# Patient Record
Sex: Female | Born: 1935
Health system: Southern US, Community
[De-identification: ages and names within clinical notes are randomized; demographics above are authoritative.]

## PROBLEM LIST (undated history)

## (undated) DIAGNOSIS — I350 Nonrheumatic aortic (valve) stenosis: Secondary | ICD-10-CM

## (undated) DIAGNOSIS — R0602 Shortness of breath: Secondary | ICD-10-CM

## (undated) DIAGNOSIS — I8390 Asymptomatic varicose veins of unspecified lower extremity: Secondary | ICD-10-CM

## (undated) DIAGNOSIS — R42 Dizziness and giddiness: Secondary | ICD-10-CM

## (undated) DIAGNOSIS — H269 Unspecified cataract: Secondary | ICD-10-CM

## (undated) DIAGNOSIS — F419 Anxiety disorder, unspecified: Secondary | ICD-10-CM

## (undated) DIAGNOSIS — M545 Low back pain, unspecified: Secondary | ICD-10-CM

## (undated) DIAGNOSIS — K219 Gastro-esophageal reflux disease without esophagitis: Secondary | ICD-10-CM

## (undated) DIAGNOSIS — I4891 Unspecified atrial fibrillation: Secondary | ICD-10-CM

## (undated) DIAGNOSIS — N390 Urinary tract infection, site not specified: Secondary | ICD-10-CM

## (undated) DIAGNOSIS — M1990 Unspecified osteoarthritis, unspecified site: Secondary | ICD-10-CM

## (undated) DIAGNOSIS — I10 Essential (primary) hypertension: Secondary | ICD-10-CM

## (undated) DIAGNOSIS — I739 Peripheral vascular disease, unspecified: Secondary | ICD-10-CM

## (undated) DIAGNOSIS — D649 Anemia, unspecified: Secondary | ICD-10-CM

## (undated) DIAGNOSIS — M869 Osteomyelitis, unspecified: Secondary | ICD-10-CM

## (undated) DIAGNOSIS — J302 Other seasonal allergic rhinitis: Secondary | ICD-10-CM

## (undated) HISTORY — PX: SPINE SURGERY: SHX786

## (undated) HISTORY — DX: Essential (primary) hypertension: I10

## (undated) HISTORY — PX: OTHER SURGICAL HISTORY: SHX169

## (undated) HISTORY — DX: Other seasonal allergic rhinitis: J30.2

## (undated) HISTORY — DX: Unspecified atrial fibrillation: I48.91

## (undated) HISTORY — DX: Osteomyelitis, unspecified: M86.9

## (undated) HISTORY — DX: Peripheral vascular disease, unspecified: I73.9

## (undated) HISTORY — DX: Asymptomatic varicose veins of unspecified lower extremity: I83.90

## (undated) HISTORY — PX: CARDIAC CATHETERIZATION: SHX172

## (undated) HISTORY — DX: Nonrheumatic aortic (valve) stenosis: I35.0

## (undated) HISTORY — DX: Dizziness and giddiness: R42

## (undated) HISTORY — PX: AORTIC VALVE REPLACEMENT: SHX41

---

## 2010-05-21 ENCOUNTER — Emergency Department (HOSPITAL_COMMUNITY)
Admission: EM | Admit: 2010-05-21 | Discharge: 2010-05-21 | Disposition: A | Discharge status: Home / Self Care | Payer: Medicare Other | Attending: Emergency Medicine | Admitting: Emergency Medicine

## 2010-05-21 ENCOUNTER — Emergency Department (HOSPITAL_COMMUNITY): Payer: Medicare Other

## 2010-05-21 DIAGNOSIS — Y998 Other external cause status: Secondary | ICD-10-CM | POA: Insufficient documentation

## 2010-05-21 DIAGNOSIS — Y9229 Other specified public building as the place of occurrence of the external cause: Secondary | ICD-10-CM | POA: Insufficient documentation

## 2010-05-21 DIAGNOSIS — H9190 Unspecified hearing loss, unspecified ear: Secondary | ICD-10-CM | POA: Insufficient documentation

## 2010-05-21 DIAGNOSIS — IMO0002 Reserved for concepts with insufficient information to code with codable children: Secondary | ICD-10-CM | POA: Insufficient documentation

## 2010-05-21 DIAGNOSIS — S0990XA Unspecified injury of head, initial encounter: Secondary | ICD-10-CM | POA: Insufficient documentation

## 2010-05-21 DIAGNOSIS — M25519 Pain in unspecified shoulder: Secondary | ICD-10-CM | POA: Insufficient documentation

## 2010-05-21 DIAGNOSIS — Z79899 Other long term (current) drug therapy: Secondary | ICD-10-CM | POA: Insufficient documentation

## 2010-05-21 DIAGNOSIS — W108XXA Fall (on) (from) other stairs and steps, initial encounter: Secondary | ICD-10-CM | POA: Insufficient documentation

## 2010-10-23 ENCOUNTER — Encounter (HOSPITAL_COMMUNITY): Payer: Medicare Other

## 2010-10-28 ENCOUNTER — Encounter (HOSPITAL_COMMUNITY): Admission: RE | Payer: Self-pay | Source: Ambulatory Visit

## 2010-10-28 ENCOUNTER — Ambulatory Visit (HOSPITAL_COMMUNITY): Admission: RE | Admit: 2010-10-28 | Payer: Medicare Other | Source: Ambulatory Visit | Admitting: Ophthalmology

## 2010-10-28 SURGERY — PHACOEMULSIFICATION, CATARACT, WITH IOL INSERTION
Anesthesia: Monitor Anesthesia Care | Site: Eye | Laterality: Right

## 2010-10-29 ENCOUNTER — Encounter: Payer: Self-pay | Admitting: *Deleted

## 2010-10-31 ENCOUNTER — Ambulatory Visit (INDEPENDENT_AMBULATORY_CARE_PROVIDER_SITE_OTHER): Payer: Medicare Other | Admitting: Cardiovascular Disease

## 2010-10-31 ENCOUNTER — Encounter: Payer: Self-pay | Admitting: *Deleted

## 2010-10-31 ENCOUNTER — Encounter: Payer: Self-pay | Admitting: Cardiovascular Disease

## 2010-10-31 VITALS — BP 159/89 | HR 81 | Ht 66.5 in | Wt 150.0 lb

## 2010-10-31 DIAGNOSIS — R42 Dizziness and giddiness: Secondary | ICD-10-CM

## 2010-10-31 DIAGNOSIS — Z136 Encounter for screening for cardiovascular disorders: Secondary | ICD-10-CM

## 2010-10-31 DIAGNOSIS — R0602 Shortness of breath: Secondary | ICD-10-CM

## 2010-10-31 DIAGNOSIS — I359 Nonrheumatic aortic valve disorder, unspecified: Secondary | ICD-10-CM

## 2010-10-31 DIAGNOSIS — I10 Essential (primary) hypertension: Secondary | ICD-10-CM | POA: Insufficient documentation

## 2010-10-31 DIAGNOSIS — I350 Nonrheumatic aortic (valve) stenosis: Secondary | ICD-10-CM | POA: Insufficient documentation

## 2010-10-31 LAB — PROTIME-INR

## 2010-10-31 NOTE — Progress Notes (Signed)
HPI  This is an 77 year old female who is referred by Dr. Sherril Croon for evaluation of recently diagnosed severe aortic stenosis. The patient was not aware of any previous cardiac history. Overall she is relatively healthy with no chronic medical conditions except hypertension. She has no previous history of his ischemic heart disease, diabetes or stroke. She did have a fall in May of this year but she denies syncope. She does admit that she's been feeling dizzy recently especially upon standing up. She denies any chest pain. However, she's been having progressive exertional dyspnea. Although she says that she feels fine, it is clear that she gradually cut down in her activities throughout the years. Currently, the maximum that she walks is going to the mailbox. She appears to be short of breath just talking with her and also moving from the chair to the exam table. She denies any orthopnea or PND. She has no previous history of peripheral arterial disease. She had an echocardiogram done which showed normal LV systolic function with an estimated ejection fraction of 60-65% with left ventricular hypertrophy and diastolic dysfunction. The aortic valve was noted to be calcified with severe aortic stenosis. Mean gradient was 43 and maximum gradient was 81 mmHg with a calculated valve area of 0.57 cm. There was mild tricuspid regurgitation with an estimated pulmonary artery systolic pressure between 40 and 50.  Allergies  Allergen Reactions  . Penicillins Swelling  . Sulfa Antibiotics Other (See Comments)    unknown     Current Outpatient Prescriptions on File Prior to Visit  Medication Sig Dispense Refill  . b complex vitamins tablet Take 1 tablet by mouth daily.        . cholecalciferol (VITAMIN D) 400 UNITS TABS Take 400 Units by mouth daily.        . Garlic 1000 MG CAPS Take 1 capsule by mouth daily.        . Multiple Vitamin (MULTIVITAMIN) tablet Take 1 tablet by mouth daily.        Marland Kitchen POTASSIUM  GLUCONATE PO Take 1 tablet by mouth daily.           Past Medical History  Diagnosis Date  . Varicose veins   . Seasonal allergies   . Osteomyelitis     history of osteomyelistis in the leg  . Aortic stenosis   . Dizziness   . Hypertension      History reviewed. No pertinent past surgical history.   History reviewed. No pertinent family history.   History   Social History  . Marital Status: Single    Spouse Name: N/A    Number of Children: N/A  . Years of Education: N/A   Occupational History  . Not on file.   Social History Main Topics  . Smoking status: Never Smoker   . Smokeless tobacco: Never Used  . Alcohol Use: No  . Drug Use: No  . Sexually Active: Not on file   Other Topics Concern  . Not on file   Social History Narrative  . No narrative on file     ROS Constitutional: Negative for fever, chills, diaphoresis, activity change, appetite change and fatigue.  HENT: Negative for hearing loss, nosebleeds, congestion, sore throat, facial swelling, drooling, trouble swallowing, neck pain, voice change, sinus pressure and tinnitus.  Eyes: Negative for photophobia, pain, discharge and visual disturbance.  Respiratory: Negative for apnea, cough, chest tightness and wheezing.  Cardiovascular: Negative for chest pain, palpitations and leg swelling.  Gastrointestinal: Negative for nausea,  vomiting, abdominal pain, diarrhea, constipation, blood in stool and abdominal distention.  Genitourinary: Negative for dysuria, urgency, frequency, hematuria and decreased urine volume.  Musculoskeletal: Negative for myalgias, back pain, joint swelling, arthralgias and gait problem.  Skin: Negative for color change, pallor, rash and wound.  Neurological: Negative for  tremors, seizures, syncope, speech difficulty, weakness, light-headedness, numbness and headaches.  Psychiatric/Behavioral: Negative for suicidal ideas, hallucinations, behavioral problems and agitation. The  patient is not nervous/anxious.      PHYSICAL EXAM   BP 159/89  Pulse 81  Ht 5' 6.5" (1.689 m)  Wt 150 lb (68.04 kg)  BMI 23.85 kg/m2  Constitutional: She is oriented to person, place, and time. She appears well-developed and well-nourished. No distress.  HENT: No nasal discharge.  Head: Normocephalic and atraumatic.  Eyes: Pupils are equal, round, and reactive to light. Right eye exhibits no discharge. Left eye exhibits no discharge.  Neck: Normal range of motion. Neck supple. No JVD present. No thyromegaly present. Faint bruits heard bilaterally but could be from irradiated carotid murmur Cardiovascular: Normal rate, regular rhythm, diminished S2 and intact distal pulses. Exam reveals no gallop and no friction rub.  There is a 3/6 systolic ejection murmur heard best at the aortic area with very diminished S2 and overall late peaking..  Pulmonary/Chest: Effort normal and breath sounds normal. No stridor. No respiratory distress. She has no wheezes. She has no rales. She exhibits no tenderness.  Abdominal: Soft. Bowel sounds are normal. She exhibits no distension. There is no tenderness. There is no rebound and no guarding.  Musculoskeletal: Normal range of motion. She exhibits no edema and no tenderness.  Neurological: She is alert and oriented to person, place, and time. Coordination normal.  Skin: Skin is warm and dry. No rash noted. She is not diaphoretic. No erythema. No pallor.  Psychiatric: She has a normal mood and affect. Her behavior is normal. Judgment and thought content normal.    EKG: Normal sinus rhythm with diffuse ST depression in the inferior leads as well as anterolateral leads of at least 1 mm suggestive of ischemia.   ASSESSMENT AND PLAN

## 2010-10-31 NOTE — Assessment & Plan Note (Signed)
Due to severe aortic stenosis, I would not treat this at this time.

## 2010-10-31 NOTE — Assessment & Plan Note (Signed)
The patient has severe aortic stenosis as outlined above. I think she is very symptomatic but she has cut down her activities significantly to the point where she doesn't report the symptoms. She is clearly dyspneic and has been having increased dizziness although without frank syncope or heart failure. She reports no chest pain. I think aortic valve replacement is indicated. I had a prolonged discussion with her about different management options that include traditional aortic valve replacement versus transcatheter aortic valve replacement which is less invasive. I will request an echocardiogram first to be reviewed. We'll schedule the patient for cardiac catheterization to rule out underlying coronary artery disease as well. I still think that the patient's surgical risk with aortic valve replacement might not be prohibitive given that she has no other chronic medical conditions.

## 2010-10-31 NOTE — Patient Instructions (Addendum)
Your physician recommends that you go to the [facility] for lab work/chest x-ray. DO TODAY. Your physician has requested that you have a carotid duplex. This test is an ultrasound of the carotid arteries in your neck. It looks at blood flow through these arteries that supply the brain with blood. Allow one hour for this exam. There are no restrictions or special instructions. Your physician has requested that you have a cardiac catheterization. Cardiac catheterization is used to diagnose and/or treat various heart conditions. Doctors may recommend this procedure for a number of different reasons. The most common reason is to evaluate chest pain. Chest pain can be a symptom of coronary artery disease (CAD), and cardiac catheterization can show whether plaque is narrowing or blocking your heart's arteries. This procedure is also used to evaluate the valves, as well as measure the blood flow and oxygen levels in different parts of your heart. For further information please visit [email or web address] Please follow instruction sheet, as given.

## 2010-10-31 NOTE — Assessment & Plan Note (Signed)
This is likely due to aortic stenosis but we'll also have to rule out carotid artery disease. Thus, I will request bilateral carotid ultrasound Doppler.

## 2010-11-01 ENCOUNTER — Telehealth: Payer: Self-pay | Admitting: *Deleted

## 2010-11-01 NOTE — Telephone Encounter (Signed)
No precert required 

## 2010-11-01 NOTE — Telephone Encounter (Signed)
Precert for cath:  (L) Heart Cath  JV Lab Dr. Kirke Corin 11/06/10 Arrive 10:30 for 11:30 case

## 2010-11-06 ENCOUNTER — Inpatient Hospital Stay (HOSPITAL_BASED_OUTPATIENT_CLINIC_OR_DEPARTMENT_OTHER)
Admission: RE | Admit: 2010-11-06 | Discharge: 2010-11-06 | Disposition: A | Discharge status: Home / Self Care | Payer: Medicare Other | Source: Ambulatory Visit | Attending: Cardiovascular Disease | Admitting: Cardiovascular Disease

## 2010-11-06 DIAGNOSIS — I359 Nonrheumatic aortic valve disorder, unspecified: Secondary | ICD-10-CM

## 2010-11-06 LAB — POCT I-STAT 3, ART BLOOD GAS (G3+)
pCO2 arterial: 31 mmHg — ABNORMAL LOW (ref 35.0–45.0)
pH, Arterial: 7.471 — ABNORMAL HIGH (ref 7.350–7.400)

## 2010-11-06 LAB — POCT I-STAT 3, VENOUS BLOOD GAS (G3P V): Acid-base deficit: 1 mmol/L (ref 0.0–2.0)

## 2010-11-08 NOTE — Cardiovascular Report (Signed)
NAMEJENISSA, Lauren Morrow             ACCOUNT NO.:  [id number]  MEDICAL RECORD NO.:  [id number]  LOCATION:  [facility]                         FACILITY:  [facility]  PHYSICIAN:  Lorine Bears, MD     DATE OF BIRTH:  1924-10-31  DATE OF PROCEDURE:  11/06/2010 DATE OF DISCHARGE:                           CARDIAC CATHETERIZATION   PRIMARY CARE PHYSICIAN:  Doreen Beam, MD  PROCEDURES PERFORMED: 1. Right heart catheterization. 2. Coronary angiography.  INDICATION AND CLINICAL HISTORY:  This is an 77 year old female who has been relatively healthy throughout her life.  She has history of hypertension, but no other chronic medical conditions.  She recently had increased dyspnea and dizziness without syncope.  She was evaluated by an echocardiogram, which was reviewed by me.  It showed normal LV systolic function with an estimated ejection fraction of 60-65% with mild left ventricular hypertrophy and mild diastolic dysfunction.  The aortic valve was noted to be heavily calcified with evidence of severe aortic stenosis.  Mean gradient was 43 mmHg and maximum gradient was 81 mmHg.  Calculated valve area was around 0.6 cm2.  No other valvular abnormalities were noted.  Due to her symptoms and aortic stenosis, I recommended proceeding with coronary angiography and right heart catheterization for evaluation for aortic valve replacement.  Risks, benefits, and alternatives were discussed with the patient.  STUDY DETAILS:  A standard informed consent was obtained.  The right groin area was prepped in a sterile fashion.  This was anesthetized with 1% lidocaine.  A 7-French sheath was placed in the right femoral vein. A 4-French sheath was placed in the right femoral artery.  Right heart catheterization was done with a Swan-Ganz catheter.  Cardiac output was calculated by the Fick method.  Coronary angiography was performed with a JL-4 and 3-DRC catheters.  All catheter exchanges were done over the wire.   The patient tolerated the procedure well with no immediate complications.  STUDY FINDINGS:  Hemodynamic Findings:  Right atrial pressure is 1 mmHg, RV pressure is 21/3, pulmonary capillary wedge pressure is 4, PA pressure is 18/5 with a mean pressure of 10 mmHg, aortic pressure is 161/78 with a mean pressure of 110 mmHg.  Aortic sat is 97% and PA sat of 67%.  Cardiac output by Fick method is 3.5 L/minute with a cardiac index of 2.  Pulmonary vascular resistance is 1.7 woods units. Coronary angiography: 1. Heavy aortic valve calcifications were noted with restriction in     motion. 2. Left main coronary artery:  The vessel is normal in size and free     of significant disease. 3. Left anterior descending artery:  The vessel is normal in size and     full without any significant obstructive disease.  The first 2     diagonal branches are normal in size with no evidence of     obstructive disease.  Third diagonal is very small in size. 4. Left circumflex artery:  The vessel is normal in size and     nondominant.  It is tortuous in the proximal segment causing less     than 20% stenosis and otherwise, there is no evidence of     obstructive disease. 5.  Right coronary artery:  The vessel is normal in size and dominant.     It is smooth and free of any significant calcifications or     obstructive disease.  STUDY CONCLUSIONS: 1. Normal filling pressures with no evidence of significant pulmonary     hypertension.  Mildly reduced cardiac output. 2. No significant coronary artery disease. 3. Severe aortic stenosis by echocardiogram.  RECOMMENDATIONS:  The patient will be evaluated for aortic valve replacement.     Lorine Bears, MD     MA/MEDQ  D:  11/06/2010  T:  11/06/2010  Job:  [id number]  cc:   Doreen Beam, MD  Electronically Signed by Lorine Bears MD on 11/07/2010 09:27:45 PM

## 2010-11-13 ENCOUNTER — Encounter: Payer: Medicare Other | Admitting: *Deleted

## 2010-11-13 ENCOUNTER — Institutional Professional Consult (permissible substitution) (INDEPENDENT_AMBULATORY_CARE_PROVIDER_SITE_OTHER): Payer: Medicare Other | Admitting: Cardiothoracic Surgery

## 2010-11-13 ENCOUNTER — Encounter: Payer: Self-pay | Admitting: Cardiothoracic Surgery

## 2010-11-13 VITALS — BP 170/80 | HR 70 | Resp 16 | Ht 66.5 in | Wt 150.0 lb

## 2010-11-13 DIAGNOSIS — R55 Syncope and collapse: Secondary | ICD-10-CM

## 2010-11-13 DIAGNOSIS — I359 Nonrheumatic aortic valve disorder, unspecified: Secondary | ICD-10-CM

## 2010-11-13 NOTE — Progress Notes (Signed)
PCP is VYAS,DHRUV B., MD, MD Referring Provider is Iran Ouch, MD                         [address]            [place] [address]          [phone]    Chief Complaint  Patient presents with  . Shortness of Breath    eval aortic stenosis  . Dizziness    HPI: The patient is a 77 year old Caucasian female who presents for evaluation and treatment of recently diagnosed severe aortic stenosis. The patient has a long history of cardiac murmur. However this was asymptomatic until recently when she has developed repeated episodes of presyncope and progressive shortness of breath with exertion. A 2-D echocardiogram was performed by Dr. Sherril Croon which showed left ventricular hypertrophy, EF 60 and a heavily calcified aortic valve with severe aortic stenosis, calculated area 0.6 cm. A subsequent left and right heart cardiac catheterization by Dr. Benard Rink on October 31 demonstrated severe aortic stenosis with PA pressures of 20/10, cardiac output 3.5, mixed venous oxygen saturation 67%, and normal coronaries. The valve could not be crossed. Based on the patient's symptoms and findings at echo and catheter aortic valve replacement has been recommended.  The patient denies any family members having required heart valve surgery. She denies angina, arrhythmia, or history of rheumatic heart disease as a child. She denies any active dental complaints and sees a dentist regularly least twice year for exam and cleaning. The patient is highly functional, lives alone, drives, and does a garden.   Past Medical History  Diagnosis Date  . Varicose veins   . Seasonal allergies   . Osteomyelitis     history of osteomyelistis in the leg  . Aortic stenosis   . Dizziness   . Hypertension     Past Surgical History  Procedure Date  . Spine surgery 1967  1976    dr. Ollen Bowl    No family history on file.  Social History History  Substance Use Topics  . Smoking status: Never Smoker     . Smokeless tobacco: Never Used  . Alcohol Use: No    Current Outpatient Prescriptions  Medication Sig Dispense Refill  . acetaminophen (TYLENOL) 500 MG tablet Take 500 mg by mouth daily.        Marland Kitchen aspirin EC 81 MG tablet Take 81 mg by mouth daily.        Marland Kitchen b complex vitamins tablet Take 1 tablet by mouth daily.        . Calcium-Magnesium-Zinc 1000-400-15 MG TABS Take 1 tablet by mouth daily.        . cholecalciferol (VITAMIN D) 400 UNITS TABS Take 400 Units by mouth daily.        . Garlic 1000 MG CAPS Take 1 capsule by mouth daily.        . Multiple Vitamin (MULTIVITAMIN) tablet Take 1 tablet by mouth daily.        . Omega-3 Fatty Acids (FISH OIL) 300 MG CAPS Take 1 capsule by mouth daily.        Marland Kitchen POTASSIUM GLUCONATE PO Take 1 tablet by mouth daily.        . Pseudoephedrine-Acetaminophen (SINUS RELIEF PO) Take 1 tablet by mouth daily.          Allergies  Allergen Reactions  . Penicillins Swelling  . Sulfa Antibiotics Other (See Comments)  unknown    Review of Systems CONSTITUTIONAL no fever weight loss or night sweats. THORAX no thoracic trauma hx of cough recent upper respiratory infection or hemoptysis  CARDIAC positive for history of cardiac murmur recent dyspnea on exertion presyncope progressive and decreased exercise tolerance GI no bowel pain jaundice hepatitis or blood per rectum. VASCULAR positive varicose veins negative for DVT negative TIA negative claudication ENDOCRINE negative for diabetes negative for thyroid disease NEURO negative stroke seizure or concussion HEMATOLOGIC positive her history blood transfusion when she had her second spine operation no bleeding from the groin catheter site but positive for ecchymosis    BP 170/80  Pulse 70  Resp 16  Ht 5' 6.5" (1.689 m)  Wt 150 lb (68.04 kg)  BMI 23.85 kg/m2  SpO2 94% Physical Exam GENERAL alert pleasant elderly Caucasian female accompanied by 2 sisters no acute distress  HEENT dentition good  normocephalic pupils equal  THORAX no deformity, tenderness. Breath sounds clear and equal bilaterally.  CARDIAC for over 6 systolic ejection murmur right upper sternal border radiating to the neck no diastolic murmur no S3 gallop  ABDOMINAL soft nontender without pulsatile mass without organomegaly  EXTREMITIES no clubbing cyanosis edema or tenderness  VASCULAR positive pulses in the extremities mild-to-moderate varicose veins left greater than right  NEURO no focal motor deficit she walks with a stiff left leg with her gait   Diagnostic Tests: I reviewed her cardiac catheter and echo report and she has severe aortic stenosis with a valve area 0.6 no significant coronary disease and preserved LV systolic function.  Impression:  Severe aortic stenosis with presyncope and dyspnea exertion. She would benefit from aortic valve replacement with a tissue valve for improved survival and relief of symptoms.    Plan:Repair for aortic valve replacement with a bioprosthetic valve November 16 at [facility] hospital.

## 2010-11-13 NOTE — Patient Instructions (Signed)
Stop garlic,stop fish oil now until after heart surgery

## 2010-11-14 ENCOUNTER — Encounter: Payer: Medicare Other | Admitting: *Deleted

## 2010-11-18 ENCOUNTER — Other Ambulatory Visit: Payer: Self-pay

## 2010-11-18 ENCOUNTER — Other Ambulatory Visit: Payer: Self-pay | Admitting: Cardiothoracic Surgery

## 2010-11-18 DIAGNOSIS — I359 Nonrheumatic aortic valve disorder, unspecified: Secondary | ICD-10-CM

## 2010-11-19 ENCOUNTER — Telehealth: Payer: Self-pay | Admitting: *Deleted

## 2010-11-19 ENCOUNTER — Encounter (HOSPITAL_COMMUNITY): Payer: Self-pay | Admitting: Pharmacy Technician

## 2010-11-19 NOTE — Telephone Encounter (Signed)
Vicky s/w pt's dgt. She will go to [facility] for pre-op procedures tomorrow and notify Vicky tomorrow if she wants to r/s carotids.

## 2010-11-19 NOTE — Telephone Encounter (Signed)
Pt walked into office. She completed "walk-in" form. She states on form that she is having open heart surgery on 11/16. She is scheduled for carotid dopplers on 11/15. She would like to know if she would cancel this test.  Attempted to reach pt. Left message to call back on voicemail.

## 2010-11-20 ENCOUNTER — Encounter (HOSPITAL_COMMUNITY)
Admission: RE | Admit: 2010-11-20 | Discharge: 2010-11-20 | Disposition: A | Discharge status: Home / Self Care | Payer: Medicare Other | Source: Ambulatory Visit | Attending: Cardiothoracic Surgery | Admitting: Cardiothoracic Surgery

## 2010-11-20 ENCOUNTER — Encounter (HOSPITAL_COMMUNITY): Payer: Self-pay | Admitting: Pharmacy Technician

## 2010-11-20 ENCOUNTER — Ambulatory Visit (HOSPITAL_COMMUNITY)
Admission: RE | Admit: 2010-11-20 | Discharge: 2010-11-20 | Disposition: A | Discharge status: Home / Self Care | Payer: Medicare Other | Source: Ambulatory Visit | Attending: Cardiothoracic Surgery | Admitting: Cardiothoracic Surgery

## 2010-11-20 ENCOUNTER — Encounter (HOSPITAL_COMMUNITY): Payer: Self-pay

## 2010-11-20 ENCOUNTER — Other Ambulatory Visit: Payer: Self-pay

## 2010-11-20 DIAGNOSIS — I359 Nonrheumatic aortic valve disorder, unspecified: Secondary | ICD-10-CM

## 2010-11-20 DIAGNOSIS — Z01810 Encounter for preprocedural cardiovascular examination: Secondary | ICD-10-CM

## 2010-11-20 DIAGNOSIS — Z01812 Encounter for preprocedural laboratory examination: Secondary | ICD-10-CM | POA: Insufficient documentation

## 2010-11-20 DIAGNOSIS — I6529 Occlusion and stenosis of unspecified carotid artery: Secondary | ICD-10-CM | POA: Insufficient documentation

## 2010-11-20 DIAGNOSIS — R0602 Shortness of breath: Secondary | ICD-10-CM | POA: Insufficient documentation

## 2010-11-20 LAB — CBC
HCT: 46.1 % — ABNORMAL HIGH (ref 36.0–46.0)
Hemoglobin: 15.6 g/dL — ABNORMAL HIGH (ref 12.0–15.0)
MCH: 31.9 pg (ref 26.0–34.0)
MCHC: 33.8 g/dL (ref 30.0–36.0)
MCV: 94.3 fL (ref 78.0–100.0)
Platelets: 305 10*3/uL (ref 150–400)
RBC: 4.89 MIL/uL (ref 3.87–5.11)
RDW: 12.4 % (ref 11.5–15.5)
WBC: 9.4 10*3/uL (ref 4.0–10.5)

## 2010-11-20 LAB — BLOOD GAS, ARTERIAL
Acid-Base Excess: 0.2 mmol/L (ref 0.0–2.0)
Bicarbonate: 23.2 mEq/L (ref 20.0–24.0)
Drawn by: 34438
FIO2: 0.21 %
O2 Saturation: 99.8 %
Patient temperature: 98.6
TCO2: 24.1 mmol/L (ref 0–100)
pCO2 arterial: 30.4 mmHg — ABNORMAL LOW (ref 35.0–45.0)
pH, Arterial: 7.494 — ABNORMAL HIGH (ref 7.350–7.400)
pO2, Arterial: 146 mmHg — ABNORMAL HIGH (ref 80.0–100.0)

## 2010-11-20 LAB — TYPE AND SCREEN
ABO/RH(D): A POS
Antibody Screen: NEGATIVE

## 2010-11-20 LAB — COMPREHENSIVE METABOLIC PANEL
ALT: 19 U/L (ref 0–35)
AST: 17 U/L (ref 0–37)
Albumin: 4.2 g/dL (ref 3.5–5.2)
Alkaline Phosphatase: 62 U/L (ref 39–117)
BUN: 13 mg/dL (ref 6–23)
CO2: 27 mEq/L (ref 19–32)
Calcium: 10.8 mg/dL — ABNORMAL HIGH (ref 8.4–10.5)
Chloride: 102 mEq/L (ref 96–112)
Creatinine, Ser: 0.66 mg/dL (ref 0.50–1.10)
GFR calc Af Amer: >90 mL/min (ref >90)
GFR calc non Af Amer: 79 mL/min — ABNORMAL LOW (ref >90)
Glucose, Bld: 91 mg/dL (ref 70–99)
Potassium: 4.6 mEq/L (ref 3.5–5.1)
Sodium: 139 mEq/L (ref 135–145)
Total Bilirubin: 0.4 mg/dL (ref 0.3–1.2)
Total Protein: 7.5 g/dL (ref 6.0–8.3)

## 2010-11-20 LAB — SURGICAL PCR SCREEN
MRSA, PCR: NEGATIVE
Staphylococcus aureus: NEGATIVE

## 2010-11-20 LAB — URINALYSIS, ROUTINE W REFLEX MICROSCOPIC
Bilirubin Urine: NEGATIVE
Glucose, UA: NEGATIVE mg/dL
Hgb urine dipstick: NEGATIVE
Ketones, ur: NEGATIVE mg/dL
Leukocytes, UA: NEGATIVE
Nitrite: NEGATIVE
Protein, ur: NEGATIVE mg/dL
Specific Gravity, Urine: 1.012 (ref 1.005–1.030)
Urobilinogen, UA: 0.2 mg/dL (ref 0.0–1.0)
pH: 7.5 (ref 5.0–8.0)

## 2010-11-20 LAB — ABO/RH: ABO/RH(D): A POS

## 2010-11-20 LAB — HEMOGLOBIN A1C
Hgb A1c MFr Bld: 5.4 % (ref <5.7)
Mean Plasma Glucose: 108 mg/dL (ref <117)

## 2010-11-20 LAB — PROTIME-INR
INR: 1.02 (ref 0.00–1.49)
Prothrombin Time: 13.6 seconds (ref 11.6–15.2)

## 2010-11-20 LAB — APTT: aPTT: 27 seconds (ref 24–37)

## 2010-11-20 MED ORDER — CHLORHEXIDINE GLUCONATE 4 % EX LIQD: 30.0000 mL | CUTANEOUS | Status: DC

## 2010-11-20 NOTE — Progress Notes (Signed)
*  PRELIMINARY RESULTS*  Pre-Cabg Dopplers has been performed.  Preliminary - No significant ICA stenosis bilaterally. Vertebral arteries are patent with antegrade flow. [place] Arch Evaluation - Right Doppler waveforms remains normal with radial compression and decreased by 50% with ulnar compression. Left Doppler waveforms remain normal with radial and ulnar compressions.  Mila Homer 11/20/2010, 2:41 PM

## 2010-11-20 NOTE — Progress Notes (Signed)
  Echocardiogram 2D Echocardiogram has been performed.  Dewitt Hoes, RDCS 11/20/2010, 3:31 PM

## 2010-11-20 NOTE — Pre-Procedure Instructions (Signed)
20 Lauren Morrow  11/20/2010   Your procedure is scheduled on:  Nov 22, 2010  Report to [facility] at 0530 AM.  Call this number if you have problems the morning of surgery: [phone]   Remember:   Do not eat food:After Midnight.  Do not drink clear liquids: 4 Hours before arrival.  Take these medicines the morning of surgery with A SIP OF WATER: none   Do not wear jewelry, make-up or nail polish.  Do not wear lotions, powders, or perfumes. You may wear deodorant.  Do not shave 48 hours prior to surgery.  Do not bring valuables to the hospital.  Contacts, dentures or bridgework may not be worn into surgery.  Leave suitcase in the car. After surgery it may be brought to your room.  For patients admitted to the hospital, checkout time is 11:00 AM the day of discharge.   Patients discharged the day of surgery will not be allowed to drive home.  Name and phone number of your driver: NA  Special Instructions: CHG Shower Use Special Wash: 1/2 bottle night before surgery and 1/2 bottle morning of surgery.   Please read over the following fact sheets that you were given: Pain Booklet, Blood Transfusion Information and Surgical Site Infection Prevention

## 2010-11-21 ENCOUNTER — Encounter: Payer: Medicare Other | Admitting: *Deleted

## 2010-11-21 ENCOUNTER — Other Ambulatory Visit (HOSPITAL_COMMUNITY): Payer: Medicare Other

## 2010-11-21 MED ORDER — NITROGLYCERIN IN D5W 200-5 MCG/ML-% IV SOLN
2.0000 ug/min | INTRAVENOUS | Status: AC
  Administered 2010-11-22: 5 ug/min via INTRAVENOUS
  Filled 2010-11-21: qty 250

## 2010-11-21 MED ORDER — MOXIFLOXACIN HCL IN NACL 400 MG/250ML IV SOLN
400.0000 mg | INTRAVENOUS | Status: AC
  Administered 2010-11-22: 100 mg via INTRAVENOUS
  Filled 2010-11-21: qty 250

## 2010-11-21 MED ORDER — SODIUM CHLORIDE 0.9 % IV SOLN
INTRAVENOUS | Status: AC
  Administered 2010-11-22: 1.2 [IU]/h via INTRAVENOUS
  Filled 2010-11-21: qty 1

## 2010-11-21 MED ORDER — DOPAMINE-DEXTROSE 3.2-5 MG/ML-% IV SOLN
2.0000 ug/kg/min | INTRAVENOUS | Status: AC
  Administered 2010-11-22: 3 ug/kg/min via INTRAVENOUS
  Filled 2010-11-21: qty 250

## 2010-11-21 MED ORDER — EPINEPHRINE HCL 1 MG/ML IJ SOLN
0.5000 ug/min | INTRAVENOUS | Status: DC
  Filled 2010-11-21: qty 4

## 2010-11-21 MED ORDER — PHENYLEPHRINE HCL 10 MG/ML IJ SOLN
30.0000 ug/min | INTRAVENOUS | Status: DC
  Administered 2010-11-22: 7 ug/min via INTRAVENOUS
  Filled 2010-11-21: qty 2

## 2010-11-21 MED ORDER — METOPROLOL TARTRATE 12.5 MG HALF TABLET
12.5000 mg | ORAL_TABLET | Freq: Once | ORAL | Status: AC
  Administered 2010-11-22: 12.5 mg via ORAL
  Filled 2010-11-21: qty 1

## 2010-11-21 MED ORDER — PLASMA-LYTE 148 IV SOLN
INTRAVENOUS | Status: AC
  Administered 2010-11-22: 08:00:00
  Filled 2010-11-21: qty 0.5

## 2010-11-21 MED ORDER — SODIUM CHLORIDE 0.9 % IV SOLN
INTRAVENOUS | Status: DC
  Filled 2010-11-21: qty 40

## 2010-11-21 MED ORDER — MAGNESIUM SULFATE 50 % IJ SOLN
40.0000 meq | INTRAMUSCULAR | Status: DC
  Filled 2010-11-21: qty 10

## 2010-11-21 MED ORDER — POTASSIUM CHLORIDE 2 MEQ/ML IV SOLN
80.0000 meq | INTRAVENOUS | Status: DC
  Filled 2010-11-21: qty 40

## 2010-11-21 MED ORDER — VANCOMYCIN HCL 1000 MG IV SOLR
1250.0000 mg | INTRAVENOUS | Status: AC
  Administered 2010-11-22: 1250 mg via INTRAVENOUS
  Filled 2010-11-21: qty 1250

## 2010-11-21 MED ORDER — DEXMEDETOMIDINE HCL 100 MCG/ML IV SOLN
0.1000 ug/kg/h | INTRAVENOUS | Status: AC
  Administered 2010-11-22: .2 ug/kg/h via INTRAVENOUS
  Filled 2010-11-21: qty 4

## 2010-11-22 ENCOUNTER — Inpatient Hospital Stay (HOSPITAL_COMMUNITY): Payer: Medicare Other

## 2010-11-22 ENCOUNTER — Inpatient Hospital Stay (HOSPITAL_COMMUNITY)
Admission: RE | Admit: 2010-11-22 | Discharge: 2010-12-02 | DRG: 220 | Disposition: A | Discharge status: Skilled Nursing Facility | Payer: Medicare Other | Source: Ambulatory Visit | Attending: Cardiothoracic Surgery | Admitting: Cardiothoracic Surgery

## 2010-11-22 ENCOUNTER — Other Ambulatory Visit: Payer: Self-pay | Admitting: Cardiothoracic Surgery

## 2010-11-22 ENCOUNTER — Encounter (HOSPITAL_COMMUNITY): Payer: Self-pay | Admitting: Anesthesiology

## 2010-11-22 ENCOUNTER — Encounter (HOSPITAL_COMMUNITY): Payer: Self-pay

## 2010-11-22 ENCOUNTER — Encounter (HOSPITAL_COMMUNITY)
Admission: RE | Disposition: A | Discharge status: Skilled Nursing Facility | Payer: Self-pay | Source: Ambulatory Visit | Attending: Cardiothoracic Surgery

## 2010-11-22 ENCOUNTER — Inpatient Hospital Stay (HOSPITAL_COMMUNITY): Payer: Medicare Other | Admitting: Anesthesiology

## 2010-11-22 DIAGNOSIS — D72829 Elevated white blood cell count, unspecified: Secondary | ICD-10-CM | POA: Diagnosis present

## 2010-11-22 DIAGNOSIS — R110 Nausea: Secondary | ICD-10-CM | POA: Diagnosis present

## 2010-11-22 DIAGNOSIS — D62 Acute posthemorrhagic anemia: Secondary | ICD-10-CM | POA: Diagnosis not present

## 2010-11-22 DIAGNOSIS — I359 Nonrheumatic aortic valve disorder, unspecified: Secondary | ICD-10-CM

## 2010-11-22 DIAGNOSIS — R5381 Other malaise: Secondary | ICD-10-CM | POA: Diagnosis present

## 2010-11-22 DIAGNOSIS — R42 Dizziness and giddiness: Secondary | ICD-10-CM | POA: Diagnosis present

## 2010-11-22 DIAGNOSIS — I10 Essential (primary) hypertension: Secondary | ICD-10-CM | POA: Diagnosis present

## 2010-11-22 HISTORY — PX: AORTIC VALVE REPLACEMENT: SHX41

## 2010-11-22 LAB — POCT I-STAT 4, (NA,K, GLUC, HGB,HCT)
Glucose, Bld: 101 mg/dL — ABNORMAL HIGH (ref 70–99)
Glucose, Bld: 84 mg/dL (ref 70–99)
Glucose, Bld: 95 mg/dL (ref 70–99)
Glucose, Bld: 124 mg/dL — ABNORMAL HIGH (ref 70–99)
Glucose, Bld: 125 mg/dL — ABNORMAL HIGH (ref 70–99)
Glucose, Bld: 140 mg/dL — ABNORMAL HIGH (ref 70–99)
HCT: 40 % (ref 36.0–46.0)
HCT: 24 % — ABNORMAL LOW (ref 36.0–46.0)
HCT: 33 % — ABNORMAL LOW (ref 36.0–46.0)
HCT: 24 % — ABNORMAL LOW (ref 36.0–46.0)
HCT: 22 % — ABNORMAL LOW (ref 36.0–46.0)
HCT: 28 % — ABNORMAL LOW (ref 36.0–46.0)
Hemoglobin: 13.6 g/dL (ref 12.0–15.0)
Hemoglobin: 11.2 g/dL — ABNORMAL LOW (ref 12.0–15.0)
Hemoglobin: 8.2 g/dL — ABNORMAL LOW (ref 12.0–15.0)
Hemoglobin: 8.2 g/dL — ABNORMAL LOW (ref 12.0–15.0)
Hemoglobin: 7.5 g/dL — ABNORMAL LOW (ref 12.0–15.0)
Hemoglobin: 9.5 g/dL — ABNORMAL LOW (ref 12.0–15.0)
Potassium: 3.9 mEq/L (ref 3.5–5.1)
Potassium: 3.5 mEq/L (ref 3.5–5.1)
Potassium: 3.9 mEq/L (ref 3.5–5.1)
Potassium: 4.4 mEq/L (ref 3.5–5.1)
Potassium: 3.8 mEq/L (ref 3.5–5.1)
Potassium: 3.2 mEq/L — ABNORMAL LOW (ref 3.5–5.1)
Sodium: 142 mEq/L (ref 135–145)
Sodium: 136 mEq/L (ref 135–145)
Sodium: 143 mEq/L (ref 135–145)
Sodium: 140 mEq/L (ref 135–145)
Sodium: 142 mEq/L (ref 135–145)
Sodium: 143 mEq/L (ref 135–145)

## 2010-11-22 LAB — CREATININE, SERUM
Creatinine, Ser: 0.47 mg/dL — ABNORMAL LOW (ref 0.50–1.10)
GFR calc Af Amer: >90 mL/min (ref >90)
GFR calc non Af Amer: 88 mL/min — ABNORMAL LOW (ref >90)

## 2010-11-22 LAB — POCT I-STAT 3, ART BLOOD GAS (G3+)
Acid-base deficit: 2 mmol/L (ref 0.0–2.0)
Acid-base deficit: 2 mmol/L (ref 0.0–2.0)
Acid-base deficit: 4 mmol/L — ABNORMAL HIGH (ref 0.0–2.0)
Acid-base deficit: 3 mmol/L — ABNORMAL HIGH (ref 0.0–2.0)
Acid-base deficit: 4 mmol/L — ABNORMAL HIGH (ref 0.0–2.0)
Bicarbonate: 22.7 mEq/L (ref 20.0–24.0)
Bicarbonate: 23 mEq/L (ref 20.0–24.0)
Bicarbonate: 22.4 mEq/L (ref 20.0–24.0)
Bicarbonate: 22.1 mEq/L (ref 20.0–24.0)
Bicarbonate: 22.8 mEq/L (ref 20.0–24.0)
O2 Saturation: 100 %
O2 Saturation: 100 %
O2 Saturation: 98 %
O2 Saturation: 98 %
O2 Saturation: 98 %
Patient temperature: 35.5
TCO2: 24 mmol/L (ref 0–100)
TCO2: 24 mmol/L (ref 0–100)
TCO2: 24 mmol/L (ref 0–100)
TCO2: 23 mmol/L (ref 0–100)
TCO2: 24 mmol/L (ref 0–100)
pCO2 arterial: 35.1 mmHg (ref 35.0–45.0)
pCO2 arterial: 37.8 mmHg (ref 35.0–45.0)
pCO2 arterial: 42 mmHg (ref 35.0–45.0)
pCO2 arterial: 40.8 mmHg (ref 35.0–45.0)
pCO2 arterial: 46.6 mmHg — ABNORMAL HIGH (ref 35.0–45.0)
pH, Arterial: 7.419 — ABNORMAL HIGH (ref 7.350–7.400)
pH, Arterial: 7.392 (ref 7.350–7.400)
pH, Arterial: 7.328 — ABNORMAL LOW (ref 7.350–7.400)
pH, Arterial: 7.341 — ABNORMAL LOW (ref 7.350–7.400)
pH, Arterial: 7.297 — ABNORMAL LOW (ref 7.350–7.400)
pO2, Arterial: 372 mmHg — ABNORMAL HIGH (ref 80.0–100.0)
pO2, Arterial: 292 mmHg — ABNORMAL HIGH (ref 80.0–100.0)
pO2, Arterial: 114 mmHg — ABNORMAL HIGH (ref 80.0–100.0)
pO2, Arterial: 108 mmHg — ABNORMAL HIGH (ref 80.0–100.0)
pO2, Arterial: 108 mmHg — ABNORMAL HIGH (ref 80.0–100.0)

## 2010-11-22 LAB — CBC
HCT: 28.9 % — ABNORMAL LOW (ref 36.0–46.0)
HCT: 29.5 % — ABNORMAL LOW (ref 36.0–46.0)
Hemoglobin: 9.6 g/dL — ABNORMAL LOW (ref 12.0–15.0)
Hemoglobin: 9.7 g/dL — ABNORMAL LOW (ref 12.0–15.0)
MCH: 31.6 pg (ref 26.0–34.0)
MCH: 31.2 pg (ref 26.0–34.0)
MCHC: 33.2 g/dL (ref 30.0–36.0)
MCHC: 32.9 g/dL (ref 30.0–36.0)
MCV: 95.1 fL (ref 78.0–100.0)
MCV: 94.9 fL (ref 78.0–100.0)
Platelets: 169 10*3/uL (ref 150–400)
Platelets: 149 10*3/uL — ABNORMAL LOW (ref 150–400)
RBC: 3.04 MIL/uL — ABNORMAL LOW (ref 3.87–5.11)
RBC: 3.11 MIL/uL — ABNORMAL LOW (ref 3.87–5.11)
RDW: 12.7 % (ref 11.5–15.5)
RDW: 12.8 % (ref 11.5–15.5)
WBC: 15.3 10*3/uL — ABNORMAL HIGH (ref 4.0–10.5)
WBC: 15.3 10*3/uL — ABNORMAL HIGH (ref 4.0–10.5)

## 2010-11-22 LAB — PROTIME-INR
INR: 1.77 — ABNORMAL HIGH (ref 0.00–1.49)
Prothrombin Time: 20.9 seconds — ABNORMAL HIGH (ref 11.6–15.2)

## 2010-11-22 LAB — GLUCOSE, CAPILLARY
Glucose-Capillary: 141 mg/dL — ABNORMAL HIGH (ref 70–99)
Glucose-Capillary: 129 mg/dL — ABNORMAL HIGH (ref 70–99)
Glucose-Capillary: 112 mg/dL — ABNORMAL HIGH (ref 70–99)
Glucose-Capillary: 97 mg/dL (ref 70–99)
Glucose-Capillary: 102 mg/dL — ABNORMAL HIGH (ref 70–99)

## 2010-11-22 LAB — MAGNESIUM: Magnesium: 2.5 mg/dL (ref 1.5–2.5)

## 2010-11-22 LAB — PLATELET COUNT: Platelets: 148 10*3/uL — ABNORMAL LOW (ref 150–400)

## 2010-11-22 LAB — POCT I-STAT, CHEM 8
BUN: 12 mg/dL (ref 6–23)
Calcium, Ion: 1.26 mmol/L (ref 1.12–1.32)
Chloride: 106 mEq/L (ref 96–112)
Creatinine, Ser: 0.6 mg/dL (ref 0.50–1.10)
Glucose, Bld: 138 mg/dL — ABNORMAL HIGH (ref 70–99)
HCT: 29 % — ABNORMAL LOW (ref 36.0–46.0)
Hemoglobin: 9.9 g/dL — ABNORMAL LOW (ref 12.0–15.0)
Potassium: 4.1 mEq/L (ref 3.5–5.1)
Sodium: 142 mEq/L (ref 135–145)
TCO2: 22 mmol/L (ref 0–100)

## 2010-11-22 LAB — APTT: aPTT: 42 seconds — ABNORMAL HIGH (ref 24–37)

## 2010-11-22 LAB — HEMOGLOBIN: Hemoglobin: 8.4 g/dL — ABNORMAL LOW (ref 12.0–15.0)

## 2010-11-22 LAB — HEMATOCRIT: HCT: 25.1 % — ABNORMAL LOW (ref 36.0–46.0)

## 2010-11-22 SURGERY — REPLACEMENT, AORTIC VALVE, OPEN
Anesthesia: General | Site: Chest | Wound class: Clean

## 2010-11-22 MED ORDER — ALBUMIN HUMAN 5 % IV SOLN
INTRAVENOUS | Status: DC | PRN
  Administered 2010-11-22 (×3): via INTRAVENOUS

## 2010-11-22 MED ORDER — ONDANSETRON HCL 4 MG/2ML IJ SOLN
4.0000 mg | Freq: Four times a day (QID) | INTRAMUSCULAR | Status: DC | PRN
  Administered 2010-11-24: 4 mg via INTRAVENOUS
  Filled 2010-11-22: qty 2

## 2010-11-22 MED ORDER — CALCIUM CHLORIDE 10 % IV SOLN
1.0000 g | Freq: Once | INTRAVENOUS | Status: AC
  Administered 2010-11-22: 1 g via INTRAVENOUS

## 2010-11-22 MED ORDER — DIPHENHYDRAMINE HCL 50 MG/ML IJ SOLN
12.5000 mg | Freq: Four times a day (QID) | INTRAMUSCULAR | Status: DC | PRN
  Administered 2010-11-22: 12.5 mg via INTRAVENOUS
  Filled 2010-11-22: qty 1

## 2010-11-22 MED ORDER — MAGNESIUM SULFATE 50 % IJ SOLN
4.0000 g | Freq: Once | INTRAVENOUS | Status: AC
  Administered 2010-11-22: 4 g via INTRAVENOUS
  Filled 2010-11-22: qty 8

## 2010-11-22 MED ORDER — METOPROLOL TARTRATE 1 MG/ML IV SOLN: 2.5000 mg | INTRAVENOUS | Status: DC | PRN

## 2010-11-22 MED ORDER — LACTATED RINGERS IV SOLN
1000.0000 mL | INTRAVENOUS | Status: DC
  Administered 2010-11-22 (×2): via INTRAVENOUS

## 2010-11-22 MED ORDER — ALBUMIN HUMAN 5 % IV SOLN
250.0000 mL | INTRAVENOUS | Status: AC | PRN
  Administered 2010-11-22: 250 mL via INTRAVENOUS

## 2010-11-22 MED ORDER — VANCOMYCIN HCL 1000 MG IV SOLR
1000.0000 mg | Freq: Two times a day (BID) | INTRAVENOUS | Status: AC
  Administered 2010-11-22 – 2010-11-24 (×4): 1000 mg via INTRAVENOUS
  Filled 2010-11-22 (×4): qty 1000

## 2010-11-22 MED ORDER — TRAMADOL HCL 50 MG PO TABS
50.0000 mg | ORAL_TABLET | Freq: Four times a day (QID) | ORAL | Status: DC | PRN
  Administered 2010-11-23 (×3): 50 mg via ORAL
  Filled 2010-11-22 (×3): qty 1

## 2010-11-22 MED ORDER — GLYCOPYRROLATE 0.2 MG/ML IJ SOLN
INTRAMUSCULAR | Status: DC | PRN
  Administered 2010-11-22: 0.2 mg via INTRAVENOUS

## 2010-11-22 MED ORDER — HEMOSTATIC AGENTS (NO CHARGE) OPTIME
TOPICAL | Status: DC | PRN
  Administered 2010-11-22: 1 via TOPICAL

## 2010-11-22 MED ORDER — HEPARIN SODIUM (PORCINE) 1000 UNIT/ML IJ SOLN
INTRAMUSCULAR | Status: DC | PRN
  Administered 2010-11-22: 5000 [IU] via INTRAVENOUS
  Administered 2010-11-22: 21000 [IU] via INTRAVENOUS

## 2010-11-22 MED ORDER — ACETAMINOPHEN 650 MG RE SUPP
650.0000 mg | RECTAL | Status: AC
  Administered 2010-11-22: 650 mg via RECTAL

## 2010-11-22 MED ORDER — ASPIRIN EC 325 MG PO TBEC
325.0000 mg | DELAYED_RELEASE_TABLET | Freq: Every day | ORAL | Status: DC
  Administered 2010-11-23 – 2010-11-25 (×3): 325 mg via ORAL
  Filled 2010-11-22 (×4): qty 1

## 2010-11-22 MED ORDER — SODIUM CHLORIDE 0.9 % IV SOLN: 250.0000 mL | INTRAVENOUS | Status: DC

## 2010-11-22 MED ORDER — HEMOSTATIC AGENTS (NO CHARGE) OPTIME
TOPICAL | Status: DC | PRN
  Administered 2010-11-22 (×3): 1 via TOPICAL

## 2010-11-22 MED ORDER — SODIUM CHLORIDE 0.9 % IV SOLN
INTRAVENOUS | Status: DC
  Filled 2010-11-22: qty 1

## 2010-11-22 MED ORDER — PHENYLEPHRINE HCL 10 MG/ML IJ SOLN
0.0000 ug/min | INTRAMUSCULAR | Status: DC
  Filled 2010-11-22: qty 2

## 2010-11-22 MED ORDER — POTASSIUM CHLORIDE 10 MEQ/50ML IV SOLN
10.0000 meq | Freq: Once | INTRAVENOUS | Status: AC
  Administered 2010-11-22: 10 meq via INTRAVENOUS

## 2010-11-22 MED ORDER — DEXTROSE 5 % IV SOLN: 1.5000 g | Freq: Two times a day (BID) | INTRAVENOUS | Status: DC

## 2010-11-22 MED ORDER — SODIUM CHLORIDE 0.45 % IV SOLN
INTRAVENOUS | Status: DC
  Administered 2010-11-23 – 2010-11-24 (×2): via INTRAVENOUS

## 2010-11-22 MED ORDER — SODIUM CHLORIDE 0.9 % IR SOLN
Status: DC | PRN
  Administered 2010-11-22: 1000 mL

## 2010-11-22 MED ORDER — LACTATED RINGERS IV SOLN
500.0000 mL | Freq: Once | INTRAVENOUS | Status: AC | PRN
  Administered 2010-11-22: 500 mL via INTRAVENOUS

## 2010-11-22 MED ORDER — SODIUM CHLORIDE 0.9 % IV SOLN
10.0000 g | INTRAVENOUS | Status: DC | PRN
  Administered 2010-11-22: 5 g/h via INTRAVENOUS

## 2010-11-22 MED ORDER — PANTOPRAZOLE SODIUM 40 MG PO TBEC
40.0000 mg | DELAYED_RELEASE_TABLET | Freq: Every day | ORAL | Status: DC
  Administered 2010-11-24 – 2010-11-26 (×3): 40 mg via ORAL
  Filled 2010-11-22 (×2): qty 1

## 2010-11-22 MED ORDER — SODIUM CHLORIDE 0.9 % IV SOLN: 0.1000 ug/kg/h | INTRAVENOUS | Status: DC

## 2010-11-22 MED ORDER — ACETAMINOPHEN 160 MG/5ML PO SOLN: 650.0000 mg | ORAL | Status: AC

## 2010-11-22 MED ORDER — ASPIRIN 81 MG PO CHEW: 324.0000 mg | CHEWABLE_TABLET | Freq: Every day | ORAL | Status: DC

## 2010-11-22 MED ORDER — ACETAMINOPHEN 500 MG PO TABS
1000.0000 mg | ORAL_TABLET | Freq: Four times a day (QID) | ORAL | Status: DC
  Administered 2010-11-22 – 2010-11-26 (×16): 1000 mg via ORAL
  Filled 2010-11-22 (×18): qty 2

## 2010-11-22 MED ORDER — DOPAMINE-DEXTROSE 3.2-5 MG/ML-% IV SOLN: 2.0000 ug/kg/min | INTRAVENOUS | Status: DC

## 2010-11-22 MED ORDER — ROCURONIUM BROMIDE 100 MG/10ML IV SOLN
INTRAVENOUS | Status: DC | PRN
  Administered 2010-11-22: 50 mg via INTRAVENOUS

## 2010-11-22 MED ORDER — VANCOMYCIN HCL 1000 MG IV SOLR
1000.0000 mg | Freq: Once | INTRAVENOUS | Status: DC
  Filled 2010-11-22: qty 1000

## 2010-11-22 MED ORDER — METOPROLOL TARTRATE 25 MG/10 ML ORAL SUSPENSION
12.5000 mg | Freq: Two times a day (BID) | ORAL | Status: DC
  Filled 2010-11-22 (×9): qty 5

## 2010-11-22 MED ORDER — LACTATED RINGERS IV SOLN
INTRAVENOUS | Status: DC
  Administered 2010-11-22: 21:00:00 via INTRAVENOUS

## 2010-11-22 MED ORDER — MORPHINE SULFATE 2 MG/ML IJ SOLN: 1.0000 mg | INTRAMUSCULAR | Status: AC | PRN

## 2010-11-22 MED ORDER — SODIUM CHLORIDE 0.9 % IV SOLN: INTRAVENOUS | Status: DC

## 2010-11-22 MED ORDER — SODIUM CHLORIDE 0.9 % IJ SOLN
3.0000 mL | Freq: Two times a day (BID) | INTRAMUSCULAR | Status: DC
  Administered 2010-11-23: 3 mL via INTRAVENOUS
  Administered 2010-11-23: 10:00:00 via INTRAVENOUS
  Administered 2010-11-24 – 2010-11-25 (×3): 3 mL via INTRAVENOUS
  Administered 2010-11-25: 12:00:00 via INTRAVENOUS

## 2010-11-22 MED ORDER — BISACODYL 10 MG RE SUPP: 10.0000 mg | Freq: Every day | RECTAL | Status: DC

## 2010-11-22 MED ORDER — DOCUSATE SODIUM 100 MG PO CAPS
200.0000 mg | ORAL_CAPSULE | Freq: Every day | ORAL | Status: DC
  Administered 2010-11-23 – 2010-11-25 (×3): 200 mg via ORAL
  Filled 2010-11-22 (×2): qty 2

## 2010-11-22 MED ORDER — BISACODYL 5 MG PO TBEC
10.0000 mg | DELAYED_RELEASE_TABLET | Freq: Every day | ORAL | Status: DC
  Administered 2010-11-23 – 2010-11-25 (×3): 10 mg via ORAL
  Filled 2010-11-22: qty 2

## 2010-11-22 MED ORDER — PHENYLEPHRINE HCL 10 MG/ML IJ SOLN
10.0000 mg | INTRAVENOUS | Status: DC | PRN
  Administered 2010-11-22: 25 ug/min via INTRAVENOUS

## 2010-11-22 MED ORDER — LACTATED RINGERS IV SOLN: 1000.0000 mL | INTRAVENOUS | Status: DC

## 2010-11-22 MED ORDER — PROTAMINE SULFATE 10 MG/ML IV SOLN
INTRAVENOUS | Status: DC | PRN
  Administered 2010-11-22: 10 mg via INTRAVENOUS
  Administered 2010-11-22: 250 mg via INTRAVENOUS

## 2010-11-22 MED ORDER — VECURONIUM BROMIDE 10 MG IV SOLR
INTRAVENOUS | Status: DC | PRN
  Administered 2010-11-22 (×2): 5 mg via INTRAVENOUS

## 2010-11-22 MED ORDER — NITROGLYCERIN IN D5W 200-5 MCG/ML-% IV SOLN: 0.0000 ug/min | INTRAVENOUS | Status: DC

## 2010-11-22 MED ORDER — VANCOMYCIN HCL 1000 MG IV SOLR: 1250.0000 mg | INTRAVENOUS | Status: DC

## 2010-11-22 MED ORDER — MORPHINE SULFATE 4 MG/ML IJ SOLN: 2.0000 mg | INTRAMUSCULAR | Status: DC | PRN

## 2010-11-22 MED ORDER — POTASSIUM CHLORIDE 10 MEQ/50ML IV SOLN
10.0000 meq | INTRAVENOUS | Status: AC
  Administered 2010-11-22 (×3): 10 meq via INTRAVENOUS

## 2010-11-22 MED ORDER — MIDAZOLAM HCL 5 MG/5ML IJ SOLN
INTRAMUSCULAR | Status: DC | PRN
  Administered 2010-11-22: 3 mg via INTRAVENOUS
  Administered 2010-11-22: 1 mg via INTRAVENOUS

## 2010-11-22 MED ORDER — FAMOTIDINE IN NACL 20-0.9 MG/50ML-% IV SOLN
20.0000 mg | Freq: Two times a day (BID) | INTRAVENOUS | Status: DC
  Administered 2010-11-22: 20 mg via INTRAVENOUS
  Filled 2010-11-22: qty 50

## 2010-11-22 MED ORDER — ACETAMINOPHEN 160 MG/5ML PO SOLN
975.0000 mg | Freq: Four times a day (QID) | ORAL | Status: DC
  Filled 2010-11-22: qty 40.6

## 2010-11-22 MED ORDER — SODIUM CHLORIDE 0.9 % IV SOLN
0.1000 ug/kg/h | INTRAVENOUS | Status: DC
  Filled 2010-11-22: qty 2

## 2010-11-22 MED ORDER — FENTANYL CITRATE 0.05 MG/ML IJ SOLN
INTRAMUSCULAR | Status: DC | PRN
  Administered 2010-11-22: 250 ug via INTRAVENOUS
  Administered 2010-11-22: 225 ug via INTRAVENOUS
  Administered 2010-11-22: 25 ug via INTRAVENOUS
  Administered 2010-11-22: 250 ug via INTRAVENOUS

## 2010-11-22 MED ORDER — VANCOMYCIN HCL IN DEXTROSE 1-5 GM/200ML-% IV SOLN: 1000.0000 mg | Freq: Two times a day (BID) | INTRAVENOUS | Status: DC

## 2010-11-22 MED ORDER — METOPROLOL TARTRATE 12.5 MG HALF TABLET
12.5000 mg | ORAL_TABLET | Freq: Two times a day (BID) | ORAL | Status: DC
  Administered 2010-11-24 – 2010-11-26 (×4): 12.5 mg via ORAL
  Filled 2010-11-22 (×10): qty 1

## 2010-11-22 MED ORDER — PROPOFOL 10 MG/ML IV EMUL
INTRAVENOUS | Status: DC | PRN
  Administered 2010-11-22: 50 mg via INTRAVENOUS

## 2010-11-22 SURGICAL SUPPLY — 93 items
ADAPTER CARDIO PERF ANTE/RETRO (ADAPTER) ×3 IMPLANT
ATTRACTOMAT 16X20 MAGNETIC DRP (DRAPES) ×3 IMPLANT
BAG DECANTER FOR FLEXI CONT (MISCELLANEOUS) ×3 IMPLANT
BLADE SAW STERNAL (BLADE) ×3 IMPLANT
BLADE SURG 15 STRL LF DISP TIS (BLADE) IMPLANT
BLADE SURG 15 STRL SS (BLADE)
BLADE SURG ROTATE 9660 (MISCELLANEOUS) ×3 IMPLANT
CANISTER SUCTION 2500CC (MISCELLANEOUS) ×3 IMPLANT
CANNULA GUNDRY RCSP 15FR (MISCELLANEOUS) ×3 IMPLANT
CATH RETROPLEGIA CORONARY 14FR (CATHETERS) IMPLANT
CATH ROBINSON RED A/P 18FR (CATHETERS) IMPLANT
CATH THORACIC 36FR RT ANG (CATHETERS) ×3 IMPLANT
CLIP FOGARTY SPRING 6M (CLIP) IMPLANT
CLOTH BEACON ORANGE TIMEOUT ST (SAFETY) ×3 IMPLANT
COVER SURGICAL LIGHT HANDLE (MISCELLANEOUS) ×6 IMPLANT
CRADLE DONUT ADULT HEAD (MISCELLANEOUS) ×3 IMPLANT
DRAIN CHANNEL 32F RND 10.7 FF (WOUND CARE) ×3 IMPLANT
DRAPE CARDIOVASCULAR INCISE (DRAPES) ×2
DRAPE SLUSH MACHINE 52X66 (DRAPES) ×3 IMPLANT
DRAPE SLUSH/WARMER DISC (DRAPES) IMPLANT
DRAPE SRG 135X102X78XABS (DRAPES) ×1 IMPLANT
DRSG COVADERM 4X14 (GAUZE/BANDAGES/DRESSINGS) ×3 IMPLANT
ELECT CAUTERY BLADE 6.4 (BLADE) IMPLANT
ELECT REM PT RETURN 9FT ADLT (ELECTROSURGICAL) ×6
ELECTRODE REM PT RTRN 9FT ADLT (ELECTROSURGICAL) ×2 IMPLANT
GLOVE BIO SURGEON STRL SZ 6 (GLOVE) ×9 IMPLANT
GLOVE BIO SURGEON STRL SZ 6.5 (GLOVE) ×4 IMPLANT
GLOVE BIO SURGEON STRL SZ7 (GLOVE) ×9 IMPLANT
GLOVE BIO SURGEON STRL SZ7.5 (GLOVE) ×12 IMPLANT
GLOVE BIO SURGEONS STRL SZ 6.5 (GLOVE) ×2
GLOVE BIOGEL PI IND STRL 7.0 (GLOVE) ×3 IMPLANT
GLOVE BIOGEL PI INDICATOR 7.0 (GLOVE) ×6
GOWN STRL NON-REIN LRG LVL3 (GOWN DISPOSABLE) ×18 IMPLANT
HEART VENT LT CURVED (MISCELLANEOUS) ×3 IMPLANT
HEMOSTAT POWDER SURGIFOAM 1G (HEMOSTASIS) ×9 IMPLANT
HEMOSTAT SURGICEL 2X14 (HEMOSTASIS) ×3 IMPLANT
INSERT FOGARTY XLG (MISCELLANEOUS) IMPLANT
KIT BASIN OR (CUSTOM PROCEDURE TRAY) ×3 IMPLANT
KIT PAIN CUSTOM (MISCELLANEOUS) IMPLANT
KIT ROOM TURNOVER OR (KITS) ×3 IMPLANT
KIT SUCTION CATH 14FR (SUCTIONS) IMPLANT
LINE VENT (MISCELLANEOUS) ×3 IMPLANT
MARKER GRAFT CORONARY BYPASS (MISCELLANEOUS) IMPLANT
NS IRRIG 1000ML POUR BTL (IV SOLUTION) ×21 IMPLANT
PACK OPEN HEART (CUSTOM PROCEDURE TRAY) ×3 IMPLANT
PAD ARMBOARD 7.5X6 YLW CONV (MISCELLANEOUS) ×3 IMPLANT
PENCIL BUTTON HOLSTER BLD 10FT (ELECTRODE) ×6 IMPLANT
SET CARDIOPLEGIA MPS 5001102 (MISCELLANEOUS) ×3 IMPLANT
SPECIMEN JAR SMALL (MISCELLANEOUS) ×3 IMPLANT
SPONGE GAUZE 4X4 12PLY (GAUZE/BANDAGES/DRESSINGS) ×6 IMPLANT
SPONGE GAUZE 4X4 FOR O.R. (GAUZE/BANDAGES/DRESSINGS) ×3 IMPLANT
SPONGE LAP 18X18 X RAY DECT (DISPOSABLE) ×6 IMPLANT
SURGIFLO TRUKIT (HEMOSTASIS) ×3 IMPLANT
SUT ETHIBON 2 0 V 52N 30 (SUTURE) ×6 IMPLANT
SUT ETHIBON EXCEL 2-0 V-5 (SUTURE) IMPLANT
SUT ETHIBOND 2 0 SH (SUTURE)
SUT ETHIBOND 2 0 SH 36X2 (SUTURE) IMPLANT
SUT ETHIBOND 2 0 V4 (SUTURE) IMPLANT
SUT ETHIBOND 2 0V4 GREEN (SUTURE) IMPLANT
SUT ETHIBOND 4 0 RB 1 (SUTURE) IMPLANT
SUT ETHIBOND NAB MH 2-0 36IN (SUTURE) ×3 IMPLANT
SUT ETHIBOND V-5 VALVE (SUTURE) ×3 IMPLANT
SUT PROLENE 3 0 SH 1 (SUTURE) IMPLANT
SUT PROLENE 3 0 SH DA (SUTURE) IMPLANT
SUT PROLENE 4 0 RB 1 (SUTURE) ×20
SUT PROLENE 4 0 SH DA (SUTURE) ×6 IMPLANT
SUT PROLENE 4-0 RB1 .5 CRCL 36 (SUTURE) ×10 IMPLANT
SUT PROLENE 5 0 C 1 36 (SUTURE) ×6 IMPLANT
SUT SILK  1 MH (SUTURE) ×4
SUT SILK 1 MH (SUTURE) ×2 IMPLANT
SUT SILK 1 TIES 10X30 (SUTURE) ×3 IMPLANT
SUT SILK 2 0 (SUTURE) ×2
SUT SILK 2 0 SH CR/8 (SUTURE) ×9 IMPLANT
SUT SILK 2-0 18XBRD TIE 12 (SUTURE) ×1 IMPLANT
SUT SILK 3 0 SH CR/8 (SUTURE) ×3 IMPLANT
SUT SILK 4 0 (SUTURE) ×2
SUT SILK 4-0 18XBRD TIE 12 (SUTURE) ×1 IMPLANT
SUT STEEL 6MS V (SUTURE) ×6 IMPLANT
SUT TEM PAC WIRE 2 0 SH (SUTURE) ×12 IMPLANT
SUT VIC AB 1 CTX 36 (SUTURE)
SUT VIC AB 1 CTX36XBRD ANBCTR (SUTURE) IMPLANT
SUT VIC AB 2-0 CTX 27 (SUTURE) ×6 IMPLANT
SUT VIC AB 3-0 X1 27 (SUTURE) IMPLANT
SYR 10ML KIT SKIN ADHESIVE (MISCELLANEOUS) IMPLANT
SYSTEM SAHARA CHEST DRAIN ATS (WOUND CARE) ×3 IMPLANT
TAPE CLOTH SURG 4X10 WHT LF (GAUZE/BANDAGES/DRESSINGS) ×3 IMPLANT
TOWEL OR 17X24 6PK STRL BLUE (TOWEL DISPOSABLE) ×3 IMPLANT
TOWEL OR 17X26 10 PK STRL BLUE (TOWEL DISPOSABLE) ×6 IMPLANT
TRAY CATH LUMEN 1 20CM STRL (SET/KITS/TRAYS/PACK) ×3 IMPLANT
TRAY FOLEY IC TEMP SENS 16FR (CATHETERS) ×3 IMPLANT
UNDERPAD 30X30 INCONTINENT (UNDERPADS AND DIAPERS) ×3 IMPLANT
VALVE AORTIC MAGNA 21MM (Prosthesis & Implant Heart) ×3 IMPLANT
WATER STERILE IRR 1000ML POUR (IV SOLUTION) ×6 IMPLANT

## 2010-11-22 NOTE — Anesthesia Preprocedure Evaluation (Addendum)
Anesthesia Evaluation  Patient identified by MRN, date of birth, ID band Patient awake    Reviewed: Allergy & Precautions, H&P , NPO status , Patient's Chart, lab work & pertinent test results, reviewed documented beta blocker date and time   Airway Mallampati: I TM Distance: >3 FB Neck ROM: Full    Dental  (+) Teeth Intact and Dental Advisory Given   Pulmonary    Pulmonary exam normal       Cardiovascular hypertension, Regular Normal+ Systolic murmurs Pt reports dizziness, which is related to aortic stenosis   Neuro/Psych    GI/Hepatic   Endo/Other    Renal/GU      Musculoskeletal  (+) Arthritis -, Osteoarthritis,    Abdominal   Peds  Hematology   Anesthesia Other Findings   Reproductive/Obstetrics                          Anesthesia Physical Anesthesia Plan  ASA: IV  Anesthesia Plan: General   Post-op Pain Management:    Induction: Intravenous  Airway Management Planned: Oral ETT  Additional Equipment: Arterial line, TEE, CVP and PA Cath  Intra-op Plan:   Post-operative Plan: Post-operative intubation/ventilation  Informed Consent: I have reviewed the patients History and Physical, chart, labs and discussed the procedure including the risks, benefits and alternatives for the proposed anesthesia with the patient or authorized representative who has indicated his/her understanding and acceptance.   Dental advisory given  Plan Discussed with: CRNA and Surgeon  Anesthesia Plan Comments:         Anesthesia Quick Evaluation

## 2010-11-22 NOTE — OR Nursing (Signed)
12:48pm 2nd call made to SICU

## 2010-11-22 NOTE — Progress Notes (Signed)
[facility] BRIEF SICU PROGRESS NOTE  Day of Surgery  S/P Procedure(s) (LRB): AORTIC VALVE REPLACEMENT (AVR) (N/A)   Extubated uneventfully Stable hemodynamics Chest tube output low UOP excellent Labs okay  Assessment/Plan:  Continue routine early postop  Lauren Morrow H

## 2010-11-22 NOTE — Anesthesia Procedure Notes (Addendum)
Procedure Name: Intubation Date/Time: 11/22/2010 7:58 AM Performed by: Romie Minus Pre-anesthesia Checklist: Patient identified, Emergency Drugs available, Suction available and Patient being monitored Patient Re-evaluated:Patient Re-evaluated prior to inductionOxygen Delivery Method: Circle System Utilized Preoxygenation: Pre-oxygenation with 100% oxygen Intubation Type: IV induction Ventilation: Mask ventilation without difficulty and Oral airway inserted - appropriate to patient size Grade View: Grade I Tube type: Oral Tube size: 8.0 mm Number of attempts: 1 Airway Equipment and Method: video-laryngoscopy Placement Confirmation: positive ETCO2 and breath sounds checked- equal and bilateral Secured at: 22 cm Tube secured with: Tape Dental Injury: Teeth and Oropharynx as per pre-operative assessment

## 2010-11-22 NOTE — Anesthesia Postprocedure Evaluation (Signed)
  Anesthesia Post-op Note  Patient: Lauren Morrow  Procedure(s) Performed:  AORTIC VALVE REPLACEMENT (AVR)  Patient Location: PACU and ICU  Anesthesia Type: General  Level of Consciousness: sedated  Airway and Oxygen Therapy: Patient remains intubated per anesthesia plan  Post-op Pain: none  Post-op Assessment: Post-op Vital signs reviewed, Patient's Cardiovascular Status Stable, Respiratory Function Stable, Patent Airway, No signs of Nausea or vomiting and Pain level controlled  Post-op Vital Signs: Reviewed and stable  Complications: No apparent anesthesia complications

## 2010-11-22 NOTE — OR Nursing (Signed)
At 12:16pm called SICU 1st call

## 2010-11-22 NOTE — Procedures (Signed)
Extubation Procedure Note  Patient Details:   Name: Lauren Morrow DOB: 1924-02-14 MRN: [id number]    Evaluation  O2 sats: stable throughout Complications: No apparent complications Patient did tolerate procedure well. Bilateral Breath Sounds: Clear (Coarse)   Yes Pt extubated to 4l [place] per protocol.  Nif -32, VC 450 ml.  Pt had positive cuff leak prior to extubation.  Pt able to speak.  Lysbeth Penner [facility] 11/22/2010, 6:16 PM

## 2010-11-22 NOTE — Pre-Procedure Instructions (Signed)
                   [address]            [place] [address]          [phone]        The patient was examined and preop studies reviewed. There has been no change from the prior exam and the patient is ready for surgery.AVR with tissue valve planned.

## 2010-11-22 NOTE — Preoperative (Addendum)
Beta Blockers   Reason not to administer Beta Blockers:Not Applicable 

## 2010-11-22 NOTE — Transfer of Care (Signed)
Immediate Anesthesia Transfer of Care Note  Patient: Lauren Morrow  Procedure(s) Performed:  AORTIC VALVE REPLACEMENT (AVR)  Patient Location: PACU  Anesthesia Type: General  Level of Consciousness: sedated and unresponsive  Airway & Oxygen Therapy: Patient remains intubated per anesthesia plan and Patient placed on Ventilator (see vital sign flow sheet for setting)  Post-op Assessment: Post -op Vital signs reviewed and stable  Post vital signs: stable  Complications: No apparent anesthesia complications

## 2010-11-22 NOTE — OR Nursing (Signed)
12:01pm called volunteer desk to let family know pt. Off pump

## 2010-11-22 NOTE — Brief Op Note (Signed)
11/22/2010  1:24 PM  PATIENT:  Lind Covert  77 y.o. female  PRE-OPERATIVE DIAGNOSIS:  AORTIC STENOSIS  POST-OPERATIVE DIAGNOSIS:  Aortic stenosis  PROCEDURE:  Procedure(s): AORTIC VALVE REPLACEMENT (AVR)  SURGEON:  Surgeon(s): Mikey Bussing, MD  PHYSICIAN ASSISTANT: Graylin Shiver  ASSISTANTS:Tery Wagoner RNFA  ANESTHESIA:   general  EBL:  Total I/O In: 3398 [I.V.:2000; ZOXWR:6045; IV Piggyback:250] Out: 2950 [Urine:1450; Blood:1500]  BLOOD ADMINISTERED:1 unit platelets,1 unit FFP  DRAINS: one MT   LOCAL MEDICATIONS USED: none  SPECIMEN: Aortic Valve leaflets   DISPOSITION OF SPECIMEN:  Path  COUNTS:  Correct  TOURNIQUET:  * No tourniquets in log *  DICTATION: .Dragon Dictation  PLAN OF CARE: Admit to inpatient   PATIENT DISPOSITION:  ICU - intubated and hemodynamically stable.   Delay start of Pharmacological VTE agent (>24hrs) due to surgical blood loss or risk of bleeding:  {YES

## 2010-11-22 NOTE — Op Note (Signed)
OPERATIVE NOTE--   PROCEDURE--aortic valve replacement with a 21 mm pericardial valve Randa Evens serial number [id number])  SURGEON  Kathlee Nations trigt M.D.  ASSISTANT Coral Ceo PAc  DIAGNOSIS severe aortic stenosis  ANESTHESIA Dr. Onalee Hua crews, general  CLINICAL NOTE the patient is a 77 year old Caucasian female who presents with presyncopal episodes and severe aortic stenosis by transthoracic echocardiogram. Cardiac catheterization demonstrated no significant coronary disease. The valve was not crossed. It was felt the patient was a candidate for aortic valve replacement despite her advanced age in order to extend her survival and improve her symptoms.  I examined the patient in the office and reviewed the results of cardiac catheterization in 2-D echo with the patient and her family. I discussed indications and expected benefits of aortic valve replacement for her severe aortic stenosis. I reviewed the alternatives to surgery. I discussed with the patient the risks of surgery including risks of stroke, bleeding, MI, blood transfusion requirement, infection, and death. She understood and agreed to proceed underwent I felt was an informed consent.  PROCEDURAL NOTE  The patient was brought to the operating room and placed supine on the or table and general anesthesia was induced. After the patient was prepped and draped I placed a right femoral A-line for blood pressure monitoring as the radial a line was nonfunctional.  A sternal incision was made in in the pericardium was opened and suspended. Heparin was administered and pursestring suture placed in the ascending aorta and right atrium. The ACT was documented as being therapeutic and the patient was cannulated and placed on bypass. An LV vent was placed. Via the right pulmonary vein. Antegrade and retrograde cortically catheters were placed. After the patient was cooled to 32 the cross-clamp was applied. 800 cc of cold blood cardioplegia was  delivered between the aortic cannula in the coronary sinus catheter with good cardioplegic arrest and septal temperature less than 12.  A transverse aortotomy was performed and aortic valve was inspected. It was calcified stenotic and thickened. The leaflets were excised and the annulus debrided of calcium. The outflow tract was irrigated and the annulus sized to a 21 mm magna E. valve subannular 2-0 pledgeted Ethibond sutures were placed around the annulus numbering 16 total. The valve was prepared according to protocol with sutures placed the sewing ring. The valve was seated. There is good confirmation the valve to the annulus. The sutures were tied. There is no space for perivalvular leak. There is no obstruction of the coronary ostia. The aortotomy was closed in 2 layers using a running 4-0 Prolene. Prior to removing the cross-clamp air was vented from the coronaries with a dose of retrograde warm blood cardioplegia. The heart resumed a spontaneous rhythm.  Temporary epicardial pacing wires were applied and protamine was administered after separation from bypass without difficulty. The patient was AV paced. Hemodynamics are stable. The echo showed excellent valve function without AI, LV function was preserved. There is diffuse quite obvious the patient received platelets and FFP after protamine reversal of heparin. The superior epicardial fat was closed. Anterior mediastinal chest tube was placed. The sternum was closed in a steel wire. The pectoral/was closed a running #1 Vicryl. Subcutaneous and skin layers were closed with running Vicryl sterile dressings were applied. Bypass time 112 minutes. The patient return the ICU in stable condition.

## 2010-11-22 NOTE — OR Nursing (Signed)
Patient came to OR with 2 rings on left ring finger, pt. Gave permission for rings to be cut off--rings cut off by Sherian Rein @ 08:25am. Rings placed in cup, labeled with patient sticker and returned to patient's family in waiting room @ 08:35am-returned to [employer]. Renae Gloss, patient's sister

## 2010-11-23 ENCOUNTER — Other Ambulatory Visit: Payer: Self-pay

## 2010-11-23 ENCOUNTER — Inpatient Hospital Stay (HOSPITAL_COMMUNITY): Payer: Medicare Other

## 2010-11-23 LAB — POCT I-STAT, CHEM 8
BUN: 13 mg/dL (ref 6–23)
Calcium, Ion: 1.19 mmol/L (ref 1.12–1.32)
Chloride: 102 mEq/L (ref 96–112)
Creatinine, Ser: 0.7 mg/dL (ref 0.50–1.10)
Glucose, Bld: 116 mg/dL — ABNORMAL HIGH (ref 70–99)
HCT: 28 % — ABNORMAL LOW (ref 36.0–46.0)
Hemoglobin: 9.5 g/dL — ABNORMAL LOW (ref 12.0–15.0)
Potassium: 4 mEq/L (ref 3.5–5.1)
Sodium: 138 mEq/L (ref 135–145)
TCO2: 23 mmol/L (ref 0–100)

## 2010-11-23 LAB — GLUCOSE, CAPILLARY
Glucose-Capillary: 100 mg/dL — ABNORMAL HIGH (ref 70–99)
Glucose-Capillary: 101 mg/dL — ABNORMAL HIGH (ref 70–99)
Glucose-Capillary: 105 mg/dL — ABNORMAL HIGH (ref 70–99)
Glucose-Capillary: 105 mg/dL — ABNORMAL HIGH (ref 70–99)
Glucose-Capillary: 107 mg/dL — ABNORMAL HIGH (ref 70–99)
Glucose-Capillary: 107 mg/dL — ABNORMAL HIGH (ref 70–99)
Glucose-Capillary: 110 mg/dL — ABNORMAL HIGH (ref 70–99)
Glucose-Capillary: 110 mg/dL — ABNORMAL HIGH (ref 70–99)
Glucose-Capillary: 111 mg/dL — ABNORMAL HIGH (ref 70–99)
Glucose-Capillary: 112 mg/dL — ABNORMAL HIGH (ref 70–99)
Glucose-Capillary: 114 mg/dL — ABNORMAL HIGH (ref 70–99)
Glucose-Capillary: 118 mg/dL — ABNORMAL HIGH (ref 70–99)
Glucose-Capillary: 118 mg/dL — ABNORMAL HIGH (ref 70–99)
Glucose-Capillary: 119 mg/dL — ABNORMAL HIGH (ref 70–99)
Glucose-Capillary: 120 mg/dL — ABNORMAL HIGH (ref 70–99)
Glucose-Capillary: 127 mg/dL — ABNORMAL HIGH (ref 70–99)
Glucose-Capillary: 160 mg/dL — ABNORMAL HIGH (ref 70–99)

## 2010-11-23 LAB — PREPARE PLATELET PHERESIS: Unit division: 0

## 2010-11-23 LAB — CBC
HCT: 27.8 % — ABNORMAL LOW (ref 36.0–46.0)
HCT: 29 % — ABNORMAL LOW (ref 36.0–46.0)
Hemoglobin: 9.3 g/dL — ABNORMAL LOW (ref 12.0–15.0)
Hemoglobin: 9.5 g/dL — ABNORMAL LOW (ref 12.0–15.0)
MCH: 31.7 pg (ref 26.0–34.0)
MCH: 31.5 pg (ref 26.0–34.0)
MCHC: 33.5 g/dL (ref 30.0–36.0)
MCHC: 32.8 g/dL (ref 30.0–36.0)
MCV: 94.9 fL (ref 78.0–100.0)
MCV: 96 fL (ref 78.0–100.0)
Platelets: 122 10*3/uL — ABNORMAL LOW (ref 150–400)
Platelets: 125 10*3/uL — ABNORMAL LOW (ref 150–400)
RBC: 2.93 MIL/uL — ABNORMAL LOW (ref 3.87–5.11)
RBC: 3.02 MIL/uL — ABNORMAL LOW (ref 3.87–5.11)
RDW: 12.8 % (ref 11.5–15.5)
RDW: 13 % (ref 11.5–15.5)
WBC: 12.7 10*3/uL — ABNORMAL HIGH (ref 4.0–10.5)
WBC: 14 10*3/uL — ABNORMAL HIGH (ref 4.0–10.5)

## 2010-11-23 LAB — BASIC METABOLIC PANEL
BUN: 12 mg/dL (ref 6–23)
CO2: 24 mEq/L (ref 19–32)
Calcium: 8.4 mg/dL (ref 8.4–10.5)
Chloride: 107 mEq/L (ref 96–112)
Creatinine, Ser: 0.48 mg/dL — ABNORMAL LOW (ref 0.50–1.10)
GFR calc Af Amer: >90 mL/min (ref >90)
GFR calc non Af Amer: 88 mL/min — ABNORMAL LOW (ref >90)
Glucose, Bld: 119 mg/dL — ABNORMAL HIGH (ref 70–99)
Potassium: 3.6 mEq/L (ref 3.5–5.1)
Sodium: 139 mEq/L (ref 135–145)

## 2010-11-23 LAB — PREPARE FRESH FROZEN PLASMA: Unit division: 0

## 2010-11-23 LAB — CALCIUM, IONIZED: Calcium, Ion: 1.25 mmol/L (ref 1.12–1.32)

## 2010-11-23 LAB — MAGNESIUM
Magnesium: 2.1 mg/dL (ref 1.5–2.5)
Magnesium: 2.1 mg/dL (ref 1.5–2.5)

## 2010-11-23 LAB — CREATININE, SERUM
Creatinine, Ser: 0.51 mg/dL (ref 0.50–1.10)
GFR calc Af Amer: >90 mL/min (ref >90)
GFR calc non Af Amer: 86 mL/min — ABNORMAL LOW (ref >90)

## 2010-11-23 MED ORDER — INSULIN ASPART 100 UNIT/ML ~~LOC~~ SOLN
0.0000 [IU] | SUBCUTANEOUS | Status: DC
  Administered 2010-11-23 (×2): 2 [IU] via SUBCUTANEOUS

## 2010-11-23 MED ORDER — POTASSIUM CHLORIDE 10 MEQ/50ML IV SOLN
10.0000 meq | INTRAVENOUS | Status: AC
  Administered 2010-11-23 (×3): 10 meq via INTRAVENOUS

## 2010-11-23 MED ORDER — POTASSIUM CHLORIDE 10 MEQ/50ML IV SOLN
INTRAVENOUS | Status: AC
  Administered 2010-11-23: 08:00:00
  Filled 2010-11-23: qty 50

## 2010-11-23 MED ORDER — POTASSIUM CHLORIDE 10 MEQ/50ML IV SOLN
INTRAVENOUS | Status: AC
  Administered 2010-11-23: 10 meq via INTRAVENOUS
  Filled 2010-11-23: qty 50

## 2010-11-23 MED ORDER — INSULIN ASPART 100 UNIT/ML ~~LOC~~ SOLN
0.0000 [IU] | SUBCUTANEOUS | Status: AC
  Administered 2010-11-23: 2 [IU] via SUBCUTANEOUS
  Filled 2010-11-23: qty 3

## 2010-11-23 MED ORDER — INSULIN ASPART 100 UNIT/ML ~~LOC~~ SOLN
0.0000 [IU] | SUBCUTANEOUS | Status: DC
  Administered 2010-11-24 (×2): 2 [IU] via SUBCUTANEOUS

## 2010-11-23 MED ORDER — SODIUM CHLORIDE 0.9 % IV SOLN
INTRAVENOUS | Status: DC | PRN
  Filled 2010-11-23: qty 1

## 2010-11-23 MED ORDER — POTASSIUM CHLORIDE 10 MEQ/50ML IV SOLN
INTRAVENOUS | Status: AC
  Administered 2010-11-23: 10 meq
  Filled 2010-11-23: qty 50

## 2010-11-23 NOTE — Progress Notes (Signed)
   CARDIOTHORACIC SURGERY PROGRESS NOTE   R1 Day Post-Op Procedure(s) (LRB): AORTIC VALVE REPLACEMENT (AVR) (N/A)  Subjective: Feels okay. Very mild soreness. Still somewhat sleepy and tired.  Objective: Vital signs: Filed Vitals:   11/23/10 1000  BP:   Pulse: 91  Temp: 100 F (37.8 C)  Resp: 19    Hemodynamics: PAP: (26-46)/(13-31) 33/18 mmHg CO:  [2.5 L/min-4.3 L/min] 4.3 L/min CI:  [1.4 L/min/m2-2.4 L/min/m2] 2.4 L/min/m2  Physical Exam:  Rhythm:   Sinus brady  Breath sounds: clear  Heart sounds:  RRR  Incisions:  dry  Abdomen:  soft  Extremities:  warm   Intake/Output from previous day: 11/16 0701 - 11/17 0700 In: 6807.8 [P.O.:100; I.V.:4359.8; Blood:1148; IV Piggyback:1200] Out: 5420 [Urine:3470; Emesis/NG output:130; Blood:1500; Chest Tube:320] Intake/Output this shift: Total I/O In: 457.5 [P.O.:300; I.V.:107.5; IV Piggyback:50] Out: 130 [Urine:80; Chest Tube:50]  Lab Results:  Basename 11/23/10 0410 11/22/10 1928 11/22/10 1926  WBC 12.7* -- 15.3*  HGB 9.3* 9.9* --  HCT 27.8* 29.0* --  PLT 122* -- 149*   BMET:  Basename 11/23/10 0410 11/22/10 1928 11/20/10 1626  NA 139 142 --  K 3.6 4.1 --  CL 107 106 --  CO2 24 -- 27  GLUCOSE 119* 138* --  BUN 12 12 --  CREATININE 0.48* 0.60 --  CALCIUM 8.4 -- 10.8*    CBG (last 3)   Basename 11/23/10 0850 11/23/10 0739 11/23/10 0651  GLUCAP 111* 105* 118*   ABG    Component Value Date/Time   PHART 7.297* 11/22/2010 1924   HCO3 22.8 11/22/2010 1924   TCO2 22 11/22/2010 1928   ACIDBASEDEF 4.0* 11/22/2010 1924   O2SAT 98.0 11/22/2010 1924     Assessment/Plan: S/P Procedure(s) (LRB): AORTIC VALVE REPLACEMENT (AVR) (N/A)  Stable POD1 Mobilize D/C tubes and lines Diuresis AAI pace for now  [employer]

## 2010-11-24 ENCOUNTER — Inpatient Hospital Stay (HOSPITAL_COMMUNITY): Payer: Medicare Other

## 2010-11-24 LAB — BASIC METABOLIC PANEL
BUN: 13 mg/dL (ref 6–23)
CO2: 24 mEq/L (ref 19–32)
Calcium: 7.8 mg/dL — ABNORMAL LOW (ref 8.4–10.5)
Chloride: 100 mEq/L (ref 96–112)
Creatinine, Ser: 0.45 mg/dL — ABNORMAL LOW (ref 0.50–1.10)
GFR calc Af Amer: >90 mL/min (ref >90)
GFR calc non Af Amer: 89 mL/min — ABNORMAL LOW (ref >90)
Glucose, Bld: 124 mg/dL — ABNORMAL HIGH (ref 70–99)
Potassium: 3.8 mEq/L (ref 3.5–5.1)
Sodium: 132 mEq/L — ABNORMAL LOW (ref 135–145)

## 2010-11-24 LAB — GLUCOSE, CAPILLARY
Glucose-Capillary: 123 mg/dL — ABNORMAL HIGH (ref 70–99)
Glucose-Capillary: 114 mg/dL — ABNORMAL HIGH (ref 70–99)
Glucose-Capillary: 122 mg/dL — ABNORMAL HIGH (ref 70–99)

## 2010-11-24 LAB — CBC
HCT: 27.3 % — ABNORMAL LOW (ref 36.0–46.0)
Hemoglobin: 9 g/dL — ABNORMAL LOW (ref 12.0–15.0)
MCH: 31.5 pg (ref 26.0–34.0)
MCHC: 33 g/dL (ref 30.0–36.0)
MCV: 95.5 fL (ref 78.0–100.0)
Platelets: 127 10*3/uL — ABNORMAL LOW (ref 150–400)
RBC: 2.86 MIL/uL — ABNORMAL LOW (ref 3.87–5.11)
RDW: 12.9 % (ref 11.5–15.5)
WBC: 12.9 10*3/uL — ABNORMAL HIGH (ref 4.0–10.5)

## 2010-11-24 MED ORDER — DEXTROSE 5 % IV SOLN
30.0000 mg/h | INTRAVENOUS | Status: DC
  Administered 2010-11-24 (×2): 30 mg/h via INTRAVENOUS
  Filled 2010-11-24 (×2): qty 9

## 2010-11-24 MED ORDER — DEXTROSE 5 % IV SOLN
60.0000 mg/h | INTRAVENOUS | Status: AC
  Administered 2010-11-24: 60 mg/h via INTRAVENOUS
  Filled 2010-11-24: qty 9

## 2010-11-24 NOTE — Progress Notes (Signed)
   CARDIOTHORACIC SURGERY PROGRESS NOTE   R2 Days Post-Op Procedure(s) (LRB): AORTIC VALVE REPLACEMENT (AVR) (N/A)  Subjective: Feels okay but some nausea.  Minimal pain.  Objective: Vital signs: Filed Vitals:   11/24/10 1300  BP: 141/69  Pulse: 71  Temp:   Resp: 21    Hemodynamics:    Physical Exam:  Rhythm:   Sinus with several runs of rate-controlled AF  Breath sounds: clear  Heart sounds:  RRR  Incisions:  dry  Abdomen:  soft  Extremities:  warm   Intake/Output from previous day: 11/17 0701 - 11/18 0700 In: 2177.5 [P.O.:1380; I.V.:547.5; IV Piggyback:250] Out: 615 [Urine:565; Chest Tube:50] Intake/Output this shift: Total I/O In: 600 [P.O.:420; I.V.:80; IV Piggyback:100] Out: 420 [Urine:420]  Lab Results:  [facility] 11/24/10 0348 11/23/10 1550  WBC 12.9* 14.0*  HGB 9.0* 9.5*  HCT 27.3* 29.0*  PLT 127* 125*   BMET:  Basename 11/24/10 0348 11/23/10 1550 11/23/10 1548 11/23/10 0410  NA 132* -- 138 --  K 3.8 -- 4.0 --  CL 100 -- 102 --  CO2 24 -- -- 24  GLUCOSE 124* -- 116* --  BUN 13 -- 13 --  CREATININE 0.45* 0.51 -- --  CALCIUM 7.8* -- -- 8.4    CBG (last 3)   Basename 11/24/10 1134 11/24/10 0743 11/24/10 0343  GLUCAP 122* 114* 123*   ABG    Component Value Date/Time   PHART 7.297* 11/22/2010 1924   HCO3 22.8 11/22/2010 1924   TCO2 23 11/23/2010 1548   ACIDBASEDEF 4.0* 11/22/2010 1924   O2SAT 98.0 11/22/2010 1924     Assessment/Plan: S/P Procedure(s) (LRB): AORTIC VALVE REPLACEMENT (AVR) (N/A)  Overall doing well.  Several brief episodes PAF Will start IV amiodarone D/c foley  OWEN,CLARENCE H

## 2010-11-24 NOTE — Plan of Care (Signed)
Problem: Phase I Progression Outcomes Goal: Hemodynamically stable Outcome: Progressing Variance: Arrhythmia

## 2010-11-24 NOTE — Progress Notes (Signed)
Pharmacy - Amiodarone Drug Interactions  Patient's MAR has been assessed for drug interactions with amiodarone.  No major interactions identified at this time.  Please call/consult pharmacy with any questions.  Thanks! Reece Leader, Pharm D 11/24/2010

## 2010-11-25 LAB — BASIC METABOLIC PANEL
Chloride: 103 mEq/L (ref 96–112)
GFR calc Af Amer: >90 mL/min (ref >90)
Potassium: 4.2 mEq/L (ref 3.5–5.1)
Sodium: 136 mEq/L (ref 135–145)

## 2010-11-25 LAB — CBC
HCT: 29.6 % — ABNORMAL LOW (ref 36.0–46.0)
Platelets: 151 10*3/uL (ref 150–400)
RDW: 13 % (ref 11.5–15.5)
WBC: 14.7 10*3/uL — ABNORMAL HIGH (ref 4.0–10.5)

## 2010-11-25 MED ORDER — ALUM & MAG HYDROXIDE-SIMETH 400-400-40 MG/5ML PO SUSP
15.0000 mL | ORAL | Status: DC | PRN
  Filled 2010-11-25: qty 30

## 2010-11-25 MED ORDER — ONDANSETRON HCL 4 MG/2ML IJ SOLN: 4.0000 mg | Freq: Four times a day (QID) | INTRAMUSCULAR | Status: DC | PRN

## 2010-11-25 MED ORDER — BISACODYL 5 MG PO TBEC: 10.0000 mg | DELAYED_RELEASE_TABLET | Freq: Every day | ORAL | Status: DC | PRN

## 2010-11-25 MED ORDER — METOPROLOL TARTRATE 12.5 MG HALF TABLET
12.5000 mg | ORAL_TABLET | Freq: Two times a day (BID) | ORAL | Status: DC
  Filled 2010-11-25 (×3): qty 1

## 2010-11-25 MED ORDER — POVIDONE-IODINE 10 % EX SOLN
1.0000 "application " | Freq: Two times a day (BID) | CUTANEOUS | Status: DC
  Administered 2010-11-25 – 2010-12-02 (×14): 1 via TOPICAL
  Filled 2010-11-25: qty 15

## 2010-11-25 MED ORDER — ONDANSETRON HCL 4 MG PO TABS: 4.0000 mg | ORAL_TABLET | Freq: Four times a day (QID) | ORAL | Status: DC | PRN

## 2010-11-25 MED ORDER — DOCUSATE SODIUM 100 MG PO CAPS
200.0000 mg | ORAL_CAPSULE | Freq: Every day | ORAL | Status: DC
  Filled 2010-11-25 (×2): qty 2

## 2010-11-25 MED ORDER — MAGNESIUM HYDROXIDE 400 MG/5ML PO SUSP: 30.0000 mL | Freq: Every day | ORAL | Status: DC | PRN

## 2010-11-25 MED ORDER — AMIODARONE HCL 200 MG PO TABS
200.0000 mg | ORAL_TABLET | Freq: Two times a day (BID) | ORAL | Status: DC
  Administered 2010-11-25 – 2010-12-02 (×15): 200 mg via ORAL
  Filled 2010-11-25 (×17): qty 1

## 2010-11-25 MED ORDER — POTASSIUM CHLORIDE CRYS ER 20 MEQ PO TBCR
20.0000 meq | EXTENDED_RELEASE_TABLET | Freq: Every day | ORAL | Status: DC
  Administered 2010-11-26 – 2010-12-02 (×7): 20 meq via ORAL
  Filled 2010-11-25 (×7): qty 1

## 2010-11-25 MED ORDER — BISACODYL 10 MG RE SUPP: 10.0000 mg | Freq: Every day | RECTAL | Status: DC | PRN

## 2010-11-25 MED ORDER — GUAIFENESIN-DM 100-10 MG/5ML PO SYRP: 15.0000 mL | ORAL_SOLUTION | ORAL | Status: DC | PRN

## 2010-11-25 MED ORDER — LISINOPRIL 20 MG PO TABS
20.0000 mg | ORAL_TABLET | Freq: Once | ORAL | Status: AC
  Administered 2010-11-25: 20 mg via ORAL
  Filled 2010-11-25 (×3): qty 1

## 2010-11-25 MED ORDER — LISINOPRIL 20 MG PO TABS
20.0000 mg | ORAL_TABLET | Freq: Every day | ORAL | Status: DC
Start: 2010-11-25 — End: 2010-11-27
  Administered 2010-11-26: 20 mg via ORAL
  Filled 2010-11-25 (×2): qty 1

## 2010-11-25 MED ORDER — ASPIRIN EC 81 MG PO TBEC
81.0000 mg | DELAYED_RELEASE_TABLET | Freq: Every day | ORAL | Status: DC
  Administered 2010-11-26 – 2010-12-02 (×7): 81 mg via ORAL
  Filled 2010-11-25 (×7): qty 1

## 2010-11-25 MED ORDER — FUROSEMIDE 40 MG PO TABS
40.0000 mg | ORAL_TABLET | Freq: Every day | ORAL | Status: AC
  Administered 2010-11-26 – 2010-12-01 (×6): 40 mg via ORAL
  Filled 2010-11-25 (×6): qty 1

## 2010-11-25 MED ORDER — TRAMADOL HCL 50 MG PO TABS: 50.0000 mg | ORAL_TABLET | ORAL | Status: DC | PRN

## 2010-11-25 MED ORDER — ACETAMINOPHEN 325 MG PO TABS
650.0000 mg | ORAL_TABLET | Freq: Four times a day (QID) | ORAL | Status: DC | PRN
  Administered 2010-11-27: 650 mg via ORAL
  Filled 2010-11-25: qty 2

## 2010-11-25 MED ORDER — INSULIN ASPART 100 UNIT/ML ~~LOC~~ SOLN: 0.0000 [IU] | Freq: Three times a day (TID) | SUBCUTANEOUS | Status: DC

## 2010-11-25 MED ORDER — SODIUM CHLORIDE 0.9 % IJ SOLN
3.0000 mL | Freq: Two times a day (BID) | INTRAMUSCULAR | Status: DC
  Administered 2010-11-25 – 2010-12-02 (×14): 3 mL via INTRAVENOUS

## 2010-11-25 MED ORDER — MOVING RIGHT ALONG BOOK
Freq: Once | Status: AC
  Administered 2010-11-25: 19:00:00
  Filled 2010-11-25: qty 1

## 2010-11-25 NOTE — Progress Notes (Signed)
3 Days Post-Op Procedure(s) (LRB): AORTIC VALVE REPLACEMENT (AVR) (N/A)                          [address]            [place] [address]          [phone]     Subjective: No pain,walking in [place] A-pacing on IV amio  Objective: Vital signs in last 24 hours: Temp:  [97 F (36.1 C)-98.3 F (36.8 C)] 98.3 F (36.8 C) (11/19 0733) Pulse Rate:  [57-95] 70  (11/19 0900) Cardiac Rhythm:  [-] Atrial paced (11/19 0733) Resp:  [17-29] 21  (11/19 0900) BP: (89-168)/(56-100) 168/79 mmHg (11/19 0900) SpO2:  [91 %-98 %] 93 % (11/19 0900) Weight:  [163 lb 2.3 oz (74 kg)] 163 lb 2.3 oz (74 kg) (11/19 0500)  Hemodynamic parameters for last 24 hours:  stable  Intake/Output from previous day: 11/18 0701 - 11/19 0700 In: 2576.8 [P.O.:1620; I.V.:856.8; IV Piggyback:100] Out: 1455 [Urine:1455] Intake/Output this shift: Total I/O In: 393.4 [P.O.:360; I.V.:33.4] Out: -   Lungs clear Neuro intact  Lab Results:  Basename 11/25/10 0349 11/24/10 0348  WBC 14.7* 12.9*  HGB 9.8* 9.0*  HCT 29.6* 27.3*  PLT 151 127*   BMET:  Basename 11/25/10 0349 11/24/10 0348  NA 136 132*  K 4.2 3.8  CL 103 100  CO2 25 24  GLUCOSE 147* 124*  BUN 11 13  CREATININE 0.45* 0.45*  CALCIUM 8.6 7.8*    PT/INR:  Basename 11/22/10 1330  LABPROT 20.9*  INR 1.77*   ABG    Component Value Date/Time   PHART 7.297* 11/22/2010 1924   HCO3 22.8 11/22/2010 1924   TCO2 23 11/23/2010 1548   ACIDBASEDEF 4.0* 11/22/2010 1924   O2SAT 98.0 11/22/2010 1924   CBG (last 3)   Basename 11/24/10 1134 11/24/10 0743 11/24/10 0343  GLUCAP 122* 114* 123*    Assessment/Plan: S/P Procedure(s) (LRB): AORTIC VALVE REPLACEMENT (AVR) (N/A) Transfer to step down, no coumadin   LOS: 3 days    VAN TRIGT III,Alexis Reber 11/25/2010

## 2010-11-26 LAB — CBC
HCT: 28.4 % — ABNORMAL LOW (ref 36.0–46.0)
Hemoglobin: 9.4 g/dL — ABNORMAL LOW (ref 12.0–15.0)
MCH: 31.6 pg (ref 26.0–34.0)
MCHC: 33.1 g/dL (ref 30.0–36.0)
MCV: 95.6 fL (ref 78.0–100.0)
Platelets: 193 10*3/uL (ref 150–400)
RBC: 2.97 MIL/uL — ABNORMAL LOW (ref 3.87–5.11)
RDW: 12.8 % (ref 11.5–15.5)
WBC: 10.2 10*3/uL (ref 4.0–10.5)

## 2010-11-26 LAB — BASIC METABOLIC PANEL
BUN: 11 mg/dL (ref 6–23)
CO2: 29 mEq/L (ref 19–32)
Calcium: 8.5 mg/dL (ref 8.4–10.5)
Chloride: 104 mEq/L (ref 96–112)
Creatinine, Ser: 0.43 mg/dL — ABNORMAL LOW (ref 0.50–1.10)
GFR calc Af Amer: >90 mL/min (ref >90)
GFR calc non Af Amer: >90 mL/min (ref >90)
Glucose, Bld: 96 mg/dL (ref 70–99)
Potassium: 4 mEq/L (ref 3.5–5.1)
Sodium: 138 mEq/L (ref 135–145)

## 2010-11-26 MED FILL — Sodium Chloride Irrigation Soln 0.9%: Qty: 3000 | Status: AC

## 2010-11-26 MED FILL — Sodium Chloride IV Soln 0.9%: INTRAVENOUS | Qty: 1000 | Status: AC

## 2010-11-26 MED FILL — Heparin Sodium (Porcine) Inj 1000 Unit/ML: INTRAMUSCULAR | Qty: 60 | Status: AC

## 2010-11-26 MED FILL — Electrolyte-R (PH 7.4) Solution: INTRAVENOUS | Qty: 4000 | Status: AC

## 2010-11-26 NOTE — Progress Notes (Signed)
CARDIAC REHAB PHASE I   PRE:  Rate/Rhythm: 63SR  BP:  Supine:   Sitting: 131/71  Standing:    SaO2: 94%RA  MODE:  Ambulation: 240 ft   POST:  Rate/Rhythem: 75SR  BP:  Supine:   Sitting: 145/77  Standing:    SaO2: 95%RA  Pt walked 240 ft with rolling walker and asst x 2. Tolerated well. Tired by end of walk. Can be asst x 1 with walker. TO chair after walk. Call bell in reach. [phone]  Duanne Limerick

## 2010-11-26 NOTE — Progress Notes (Signed)
CSW received referral from RN, pt requesting SNF placement. CSW completed psychosocial assessment and seeking placement in [place] area at [place], per pt request. CSW will continue to follow for d/c planning.  Baxter Flattery, MSW [phone]

## 2010-11-26 NOTE — Progress Notes (Signed)
Physical Therapy Evaluation Patient Details Name: Lauren Morrow MRN: [id number] DOB: 1924-02-26 Today's Date: 11/26/2010  Problem List:  Patient Active Problem List  Diagnoses  . Aortic stenosis  . Dizziness  . Hypertension    Past Medical History:  Past Medical History  Diagnosis Date  . Varicose veins   . Seasonal allergies   . Osteomyelitis     history of osteomyelistis in the leg  . Aortic stenosis   . Dizziness   . Hypertension    Past Surgical History:  Past Surgical History  Procedure Date  . Spine surgery 1967  1976    dr. Ollen Bowl    PT Assessment/Plan/Recommendation PT Assessment Clinical Impression Statement: Patient is s/p AVR with deficits in balance and decr endurance limiting independence.  Will benefit from PT in hospital until pt. D/C's to a SNF to continue therapy.   PT Recommendation/Assessment: Patient will need skilled PT in the acute care venue PT Problem List: Decreased activity tolerance;Decreased balance;Decreased mobility;Decreased knowledge of use of DME;Decreased safety awareness;Decreased knowledge of precautions Barriers to Discharge: Decreased caregiver support PT Therapy Diagnosis : Difficulty walking PT Plan PT Frequency: Min 3X/week PT Treatment/Interventions: DME instruction;Gait training;Stair training;Functional mobility training;Therapeutic exercise;Balance training;Patient/family education PT Recommendation Recommendations for Other Services: OT consult Follow Up Recommendations: Skilled nursing facility;24 hour supervision/assistance Equipment Recommended: Defer to next venue PT Goals  Acute Rehab PT Goals PT Goal Formulation: With patient Time For Goal Achievement: 7 days Pt will go Supine/Side to Sit: with supervision;with HOB 0 degrees PT Goal: Supine/Side to Sit - Progress: Other (comment) Pt will Transfer Sit to Stand/Stand to Sit: with supervision;without upper extremity assist PT Transfer Goal: Sit to Stand/Stand to  Sit - Progress: Other (comment) Pt will Ambulate: >150 feet;with supervision;with least restrictive assistive device PT Goal: Ambulate - Progress: Other (comment) Pt will Go Up / Down Stairs: 3-5 stairs;with least restrictive assistive device;with min assist PT Goal: Up/Down Stairs - Progress: Other (comment) Pt will Perform Home Exercise Program: with supervision, verbal cues required/provided PT Goal: Perform Home Exercise Program - Progress: Other (comment) Additional Goals Additional Goal #1: Patient to follow sternal precautions with all mobility.   PT Goal: Additional Goal #1 - Progress: Other (comment)  PT Evaluation Precautions/Restrictions  Precautions Precautions: Sternal;Fall Required Braces or Orthoses: No Restrictions Weight Bearing Restrictions: No Prior Functioning  Home Living Lives With: Alone Receives Help From: Family (noone can stay 24 hours) Type of Home: House Home Layout: One level Home Access: Stairs to enter Entrance Stairs-Rails: Can reach both Entrance Stairs-Number of Steps: 3 Bathroom Shower/Tub: Tub/shower unit;Door Bathroom Toilet: Standard (close hamper that she pushes up on) Home Adaptive Equipment: Shower chair with back;Hand-held shower hose;Walker - rolling;Straight cane Prior Function Level of Independence: Independent with homemaking with ambulation Driving: Yes Vocation: Retired Financial risk analyst Arousal/Alertness: Awake/alert Overall Cognitive Status: Appears within functional limits for tasks assessed Orientation Level: Oriented X4 Sensation/Coordination Sensation Light Touch: Appears Intact Stereognosis: Not tested Hot/Cold: Not tested Proprioception: Not tested Coordination Gross Motor Movements are Fluid and Coordinated: Yes Fine Motor Movements are Fluid and Coordinated: Yes Extremity Assessment RUE Assessment RUE Assessment: Within Functional Limits LUE Assessment LUE Assessment: Within Functional Limits RLE  Assessment RLE Assessment: Within Functional Limits LLE Assessment LLE Assessment: Within Functional Limits Mobility (including Balance) Bed Mobility Bed Mobility: No Transfers Transfers: Yes Sit to Stand: 4: Min assist;From chair/3-in-1;With armrests;With upper extremity assist Sit to Stand Details (indicate cue type and reason): Pt. used UEs minimally.  Good technique. Stand to Sit:  5: Supervision;To chair/3-in-1;Without upper extremity assist Stand to Sit Details: Able to control descent using LEs Ambulation/Gait Ambulation/Gait: Yes Ambulation/Gait Assistance: 4: Min assist Ambulation/Gait Assistance Details (indicate cue type and reason): Pt. needed cues to stay close to RW.  Pt. also with decr stride length and decr stance time on left LE.  Pt. states there is no LE pain.  Needs cues for safety and for upright posture. Ambulation Distance (Feet): 180 Feet Assistive device: Rolling walker Gait Pattern: Decreased stance time - left;Decreased step length - left;Decreased weight shift to left;Lateral trunk lean to right Gait velocity: Slow cadence Stairs: No Wheelchair Mobility Wheelchair Mobility: No  Posture/Postural Control Posture/Postural Control: No significant limitations Balance Balance Assessed: No Exercise  Total Joint Exercises Ankle Circles/Pumps: AROM;Both;10 reps;Seated Quad Sets: AROM;Both;10 reps;Seated (in recliner with feet up) Long Arc Quad: AROM;Both;10 reps;Seated End of Session PT - End of Session Equipment Utilized During Treatment: Gait belt Activity Tolerance: Patient tolerated treatment well (Pt. limited by DOE at 2/4.  Sats to 87%.  Pursed lip breaths) Patient left: in chair;with call bell in reach;with family/visitor present Nurse Communication: Mobility status for ambulation General Behavior During Session: [facility] for tasks performed Cognition: [facility] for tasks performed  Lauren Morrow,Lauren Morrow 11/26/2010, 10:24 AM [facility] [phone] (pager)

## 2010-11-26 NOTE — Plan of Care (Signed)
Problem: Discharge Progression Outcomes Goal: Discharge plan in place and appropriate Outcome: Progressing PT evaluation completed.  Recommend SNF with therapy at D/C for short term rehab.  Continue PT.  MD:  Also recommend OT consult.  Please order if you agree.  [facility] Acute Rehabilitation [phone] (pager)

## 2010-11-26 NOTE — Progress Notes (Addendum)
4 Days Post-Op Procedure(s) (LRB): AORTIC VALVE REPLACEMENT (AVR) (N/A)  Subjective: Patient feeling better this am.  Denies nausea. Eating breakfast without problems.  Objective: Vital signs in last 24 hours: Patient Vitals for the past 24 hrs:  BP Temp Temp src Pulse Resp SpO2 Weight  11/26/10 0455 137/67 mmHg 97.9 F (36.6 C) Oral 70  20  93 % 162 lb (73.483 kg)  11/26/10 0050 156/75 mmHg - - 70  - - -  11/25/10 2212 179/81 mmHg 97.4 F (36.3 C) Oral 70  20  91 % -  11/25/10 1744 192/72 mmHg 97.9 F (36.6 C) Oral 70  21  95 % -  11/25/10 1600 - 99.2 F (37.3 C) Oral - - - -  11/25/10 1500 119/60 mmHg - - 70  18  92 % -  11/25/10 1415 170/79 mmHg - - 70  22  93 % -  11/25/10 1300 162/79 mmHg - - 70  24  94 % -  11/25/10 1200 159/83 mmHg - - 69  25  93 % -  11/25/10 1130 - 97.9 F (36.6 C) Oral - - - -  11/25/10 1100 168/81 mmHg - - 70  24  94 % -  11/25/10 1000 173/72 mmHg - - 70  20  93 % -  11/25/10 0900 168/79 mmHg - - 70  21  93 % -  11/25/10 0800 158/75 mmHg - - 70  23  93 % -   Pre op weight  67.6 kg Current Weight  11/26/10 162 lb (73.483 kg)       Intake/Output from previous day: 11/19 0701 - 11/20 0700 In: 510.1 [P.O.:360; I.V.:150.1] Out: 1601 [Urine:1600; Stool:1]   Physical Exam:  Cardiovascular: RRR, no murmurs, gallops, or rubs. Pulmonary: Slightly decreased at bases; no rales, wheezes, or rhonchi. Abdomen: Soft, non tender, bowel sounds present. Extremities: No  lower extremity edema.\ Wound: Clean and dry.  No erythema or signs of infection.  Lab Results: CBC: Basename 11/26/10 0603 11/25/10 0349  WBC 10.2 14.7*  HGB 9.4* 9.8*  HCT 28.4* 29.6*  PLT 193 151   BMET:  Basename 11/25/10 0349 11/24/10 0348  NA 136 132*  K 4.2 3.8  CL 103 100  CO2 25 24  GLUCOSE 147* 124*  BUN 11 13  CREATININE 0.45* 0.45*  CALCIUM 8.6 7.8*    PT/INR: No results found for this basename: LABPROT,INR in the last 72 hours  INR: Will add last result for  INR, ABG once components are confirmed Will add last 4 CBG results once components are confirmed  Assessment/Plan:  1. CV - Previous PAF.  A paced at 70. Continue Amiodarone 200 bid,lopressor 12.5 bid. If underlying HR is less than or equal to 60, may need to stop lopressor.  Also, on lisinopril 20 daily. Monitor SBP as may need to increase Lisinopril. 2.  Pulmonary - Encourage incentive spirometer. 3. Volume Overload - Continue with diuresis. 4.  Acute blood loss anemia - H/H stable at 9.4/28.4.   5. Leukocytosis resolved-WBC decreased from 14.7 to 10.2. 6.Conitnue with CRPI.   Ardelle Balls, PA 11/26/2010   patient examined and medical record reviewed,agree with above note.  Her rhythm is NSR @ 70, leave connected to temp pacer SNF in [place] next week on po amio, lisinopril VAN TRIGT III,Mckay Brandt 11/26/2010

## 2010-11-26 NOTE — Plan of Care (Signed)
Problem: Phase III Progression Outcomes Goal: Other Phase III Outcomes/Goals Outcome: Completed/Met Date Met:  11/26/10 Moving Right Along Book given

## 2010-11-26 NOTE — Progress Notes (Signed)
UR Completed.   Lauren Morrow Jane 11/26/2010  

## 2010-11-27 ENCOUNTER — Encounter (HOSPITAL_COMMUNITY): Payer: Self-pay | Admitting: Cardiothoracic Surgery

## 2010-11-27 ENCOUNTER — Encounter: Payer: Self-pay | Admitting: Cardiothoracic Surgery

## 2010-11-27 MED ORDER — LISINOPRIL 20 MG PO TABS
20.0000 mg | ORAL_TABLET | Freq: Two times a day (BID) | ORAL | Status: DC
  Administered 2010-11-27 – 2010-12-02 (×11): 20 mg via ORAL
  Filled 2010-11-27 (×12): qty 1

## 2010-11-27 MED ORDER — METOPROLOL TARTRATE 12.5 MG HALF TABLET
12.5000 mg | ORAL_TABLET | Freq: Two times a day (BID) | ORAL | Status: DC
  Administered 2010-11-27 – 2010-12-02 (×11): 12.5 mg via ORAL
  Filled 2010-11-27 (×12): qty 1

## 2010-11-27 MED ORDER — DIPHENHYDRAMINE HCL 12.5 MG/5ML PO ELIX
12.5000 mg | ORAL_SOLUTION | Freq: Every evening | ORAL | Status: DC | PRN
  Administered 2010-11-29 – 2010-12-01 (×4): 12.5 mg via ORAL
  Filled 2010-11-27 (×4): qty 5

## 2010-11-27 MED FILL — Potassium Chloride Inj 2 mEq/ML: INTRAVENOUS | Qty: 40 | Status: AC

## 2010-11-27 MED FILL — Magnesium Sulfate Inj 50%: INTRAMUSCULAR | Qty: 10 | Status: AC

## 2010-11-27 NOTE — Progress Notes (Signed)
CARDIAC REHAB PHASE I   PRE:  Rate/Rhythm: 77SR  BP:  Supine:   Sitting: 166/72  Standing:    SaO2: 94%RA  MODE:  Ambulation: 350 ft   POST:  Rate/Rhythem: 78SR  BP:  Supine:   Sitting: 167/79  Standing:    SaO2: 94%RA  Pt walked 350 ft on RA with rolling walker and asst x 1. Tired by end of walk.  To chair with call bell. [phone]  Duanne Limerick

## 2010-11-27 NOTE — Progress Notes (Signed)
   CARE MANAGEMENT NOTE 11/27/2010  Patient:  Lauren Morrow, Lauren Morrow   Account Number:  [id number]  Date Initiated:  11/26/2010  Documentation initiated by:  [facility]  Subjective/Objective Assessment:   AVR on 11-22-10     Action/Plan:   Anticipated DC Date:  12/02/2010   Anticipated DC Plan:  SKILLED NURSING FACILITY  In-house referral  Clinical Social Worker      DC Planning Services  CM consult      Choice offered to / List presented to:             Status of service:  In process, will continue to follow Medicare Important Message given?   (If response is "NO", the following Medicare IM given date fields will be blank) Date Medicare IM given:   Date Additional Medicare IM given:    Discharge Disposition:    Per UR Regulation:  Reviewed for med. necessity/level of care/duration of stay  Comments:  11/27/10 [name],RN,BSN 1420 PT REQUESTING SKILLED NURSING FACILITY PLACEMENT AT DISCHARGE FOR REHAB PRIOR TO RETURNING HOME.  CSW FOLLOWING TO FACILITATE DC TO SNF WHEN MEDICALLY STABLE.  11-26-10 8:50 am [name], RNBSN [phone] UR Completed.

## 2010-11-27 NOTE — Plan of Care (Signed)
Problem: Discharge Progression Outcomes Goal: Discharge plan in place and appropriate Outcome: Progressing Plan to d/c to rehab SNF next week

## 2010-11-27 NOTE — Progress Notes (Signed)
PT Cancel Note Cardiac Rehab had just seen patient when PT arrived.  Will return at a later date.  Thanks. [facility] [phone] (pager)

## 2010-11-27 NOTE — Progress Notes (Signed)
Pt has bed at [facility]. Plan for d/c to [facility] Monday, if pt medically ready. CSW will continue to follow to coordinate d/c to SNF.

## 2010-11-27 NOTE — Progress Notes (Addendum)
5 Days Post-Op Procedure(s) (LRB): AORTIC VALVE REPLACEMENT (AVR) (N/A)  Subjective: Patient has a headache this am as she did not sleep (she takes tylenol PM at home.)  Objective: Vital signs in last 24 hours: Patient Vitals for the past 24 hrs:  BP Temp Temp src Pulse Resp SpO2 Weight  11/27/10 0622 159/85 mmHg 98.3 F (36.8 C) Oral 67  18  97 % 159 lb (72.122 kg)  11/26/10 2020 174/76 mmHg - Oral 73  19  - -  11/26/10 1300 145/77 mmHg 97.5 F (36.4 C) Oral 66  20  94 % -  11/26/10 0952 - - - 70  - - -   Pre op weight  67.6 kg Current Weight  11/27/10 159 lb (72.122 kg)       Intake/Output from previous day: 11/20 0701 - 11/21 0700 In: 480 [P.O.:480] Out: 1900 [Urine:1900]   Physical Exam:  Cardiovascular: RRR, no murmurs, gallops, or rubs. Pulmonary: Clear to auscultation bilaterally; no rales, wheezes, or rhonchi. Abdomen: Soft, non tender, bowel sounds present. Extremities: Trace  lower extremity edema. Wound: Clean and dry.  No erythema or signs of infection.  Lab Results: CBC:  Basename 11/26/10 0603 11/25/10 0349  WBC 10.2 14.7*  HGB 9.4* 9.8*  HCT 28.4* 29.6*  PLT 193 151   BMET:   Basename 11/26/10 0603 11/25/10 0349  NA 138 136  K 4.0 4.2  CL 104 103  CO2 29 25  GLUCOSE 96 147*  BUN 11 11  CREATININE 0.43* 0.45*  CALCIUM 8.5 8.6    PT/INR: No results found for this basename: LABPROT,INR in the last 72 hours  INR: Will add last result for INR, ABG once components are confirmed Will add last 4 CBG results once components are confirmed  Assessment/Plan:  1. CV - Previous PAF and  A paced at 70. SR this am. Continue Amiodarone 200 bid. Also, on lisinopril 20 daily.  SBP continues to be elevated (140-170's) so will increase Lisinopril to 40 mg daily. Lopressor was discontinued late 11/20.  HR in the 70's this am. Will restart low dose with parameters and monitor HR. 2.  Pulmonary - Encourage incentive spirometer. 3. Volume Overload - Continue  with diuresis. 4.  Acute blood loss anemia - H/H stable at 9.4/28.4.   5. Benadryl for sleep. 6.Conitnue with CRPI. 7.Stop scheduled stool softeners as patient with loose stools. 8. Will need SNF when ready for discharge     Chart reviewed, patient examined. Agree with above.

## 2010-11-28 ENCOUNTER — Other Ambulatory Visit: Payer: Self-pay

## 2010-11-28 MED ORDER — AMLODIPINE BESYLATE 10 MG PO TABS
10.0000 mg | ORAL_TABLET | Freq: Every day | ORAL | Status: DC
  Administered 2010-11-28 – 2010-11-29 (×2): 10 mg via ORAL
  Filled 2010-11-28 (×2): qty 1

## 2010-11-28 NOTE — Progress Notes (Addendum)
6 Days Post-Op Procedure(s) (LRB): AORTIC VALVE REPLACEMENT (AVR) (N/A)  Subjective: Patient not with much appetite this am.  Denies, nausea, emesis, or abdominal pain.  Objective: Vital signs in last 24 hours: Patient Vitals for the past 24 hrs:  BP Temp Temp src Pulse Resp SpO2 Weight  11/28/10 0556 162/60 mmHg 98.1 F (36.7 C) Oral 72  18  96 % 156 lb 6.4 oz (70.943 kg)  11/27/10 2019 168/91 mmHg 98.1 F (36.7 C) Oral 77  18  95 % -  11/27/10 1406 166/72 mmHg 98.3 F (36.8 C) Oral 76  18  94 % -  11/27/10 0944 171/85 mmHg - - 80  - - -   Pre op weight  67.6 kg Current Weight  11/28/10 156 lb 6.4 oz (70.943 kg)       Intake/Output from previous day: 11/21 0701 - 11/22 0700 In: 123 [P.O.:120; I.V.:3] Out: 4350 [Urine:4350]   Physical Exam:  Cardiovascular: RRR, no murmurs, gallops, or rubs. Pulmonary: Clear to auscultation bilaterally; no rales, wheezes, or rhonchi. Abdomen: Soft, non tender, bowel sounds present. Extremities: No lower extremity edema. Wound: Clean and dry.  No erythema or signs of infection.  Lab Results: CBC:  Basename 11/26/10 0603  WBC 10.2  HGB 9.4*  HCT 28.4*  PLT 193   BMET:   Basename 11/26/10 0603  NA 138  K 4.0  CL 104  CO2 29  GLUCOSE 96  BUN 11  CREATININE 0.43*  CALCIUM 8.5    PT/INR: No results found for this basename: LABPROT,INR in the last 72 hours  INR: Will add last result for INR, ABG once components are confirmed Will add last 4 CBG results once components are confirmed  Assessment/Plan:  1. CV - Previous PAF and  A paced at 70. SR this am. Continue Amiodarone 200 bid.  Blood pressure continues to be elevated in 160's despite increasing Lisinopril to 40 mg daily. Continue with Lopressor 12.5 bid;  will not increase as HR remains in low 70's. Will likely need a third medicine for better blood pressure control. Will add Norvasc. 2.  Pulmonary - Encourage incentive spirometer. 3. Volume Overload - Continue with  diuresis. 4.  Acute blood loss anemia - H/H stable at 9.4/28.4.   5.Conitnue with CRPI. 6. Will need SNF when ready for discharge (likely Monday).     Chart reviewed and patient examined.  Agree with above.  She looks quite good overall.

## 2010-11-28 NOTE — Progress Notes (Signed)
At 1905, during shift change report, received phone call that pt had went into AF around 1620. On coming RN to notify physician for any instructions.  Lauren Morrow 11/28/2010 7:41 PM

## 2010-11-28 NOTE — Progress Notes (Signed)
Pt ambulated 331ft with RW and 1 assist.  Steady gait, pt tolerated well. Returned to chair, will continue to monitor.  Ninetta Lights 11/28/2010 11:34 AM

## 2010-11-28 NOTE — Progress Notes (Signed)
Pt ambulated 164ft with RW x 1 assist, steady gait, pt tolerated well.  Will continue to monitor.  Lauren Morrow 11/28/2010 6:41 PM

## 2010-11-29 ENCOUNTER — Other Ambulatory Visit: Payer: Self-pay

## 2010-11-29 MED ORDER — AMLODIPINE BESYLATE 5 MG PO TABS
5.0000 mg | ORAL_TABLET | Freq: Every day | ORAL | Status: DC
  Administered 2010-11-30 – 2010-12-02 (×3): 5 mg via ORAL
  Filled 2010-11-29 (×3): qty 1

## 2010-11-29 MED ORDER — AMLODIPINE BESYLATE 5 MG PO TABS: 5.0000 mg | ORAL_TABLET | Freq: Every day | ORAL | Status: DC

## 2010-11-29 MED ORDER — FUROSEMIDE 40 MG PO TABS: 40.0000 mg | ORAL_TABLET | Freq: Every day | ORAL | Status: DC

## 2010-11-29 MED ORDER — WARFARIN SODIUM 2.5 MG PO TABS: 2.5000 mg | ORAL_TABLET | Freq: Every day | ORAL | Status: DC

## 2010-11-29 MED ORDER — AMIODARONE HCL 200 MG PO TABS: 200.0000 mg | ORAL_TABLET | Freq: Two times a day (BID) | ORAL | Status: DC

## 2010-11-29 MED ORDER — TRAMADOL HCL 50 MG PO TABS: 50.0000 mg | ORAL_TABLET | Freq: Four times a day (QID) | ORAL | Status: DC | PRN

## 2010-11-29 MED ORDER — WARFARIN VIDEO
Freq: Once | Status: DC
  Filled 2010-11-29: qty 1

## 2010-11-29 MED ORDER — POTASSIUM CHLORIDE CRYS ER 20 MEQ PO TBCR: 20.0000 meq | EXTENDED_RELEASE_TABLET | Freq: Every day | ORAL | Status: DC

## 2010-11-29 MED ORDER — WARFARIN SODIUM 2.5 MG PO TABS
2.5000 mg | ORAL_TABLET | Freq: Every day | ORAL | Status: DC
  Administered 2010-11-29 – 2010-11-30 (×2): 2.5 mg via ORAL
  Filled 2010-11-29 (×3): qty 1

## 2010-11-29 MED ORDER — GUAIFENESIN-DM 100-10 MG/5ML PO SYRP: 15.0000 mL | ORAL_SOLUTION | Freq: Four times a day (QID) | ORAL | Status: AC | PRN

## 2010-11-29 MED ORDER — LISINOPRIL 20 MG PO TABS: 20.0000 mg | ORAL_TABLET | Freq: Two times a day (BID) | ORAL | Status: DC

## 2010-11-29 MED ORDER — PATIENT'S GUIDE TO USING COUMADIN BOOK
Freq: Once | Status: AC
  Administered 2010-11-29: 14:00:00
  Filled 2010-11-29: qty 1

## 2010-11-29 MED ORDER — METOPROLOL TARTRATE 12.5 MG HALF TABLET: 12.5000 mg | ORAL_TABLET | Freq: Two times a day (BID) | ORAL | Status: DC

## 2010-11-29 MED ORDER — DIPHENHYDRAMINE HCL 12.5 MG/5ML PO ELIX: 25.0000 mg | ORAL_SOLUTION | Freq: Every evening | ORAL | Status: DC | PRN

## 2010-11-29 NOTE — Progress Notes (Signed)
CARDIAC REHAB PHASE I   PRE:  Rate/Rhythm: 73 SR  BP:  Supine:   Sitting: 129/55  Standing:   SaO2: 95 RA  MODE:  Ambulation: 350 ft   POST:  Rate/Rhythem: 80 SR  BP:  Supine:  Sitting: 146/57  Standing:   SaO2: 95 RA  Lauren Morrow  Pt ambulated 350 ft assist x 1 with rolling walker. Tolerated well, had mild dyspnea. SAO2 remained in the mid-nineties throughout ambulation. Pt took two short rest breaks. Returned to recliner, vital signs stable. Call bell in reach. Will follow up tomorrow. Harriett Sine MS 11/29/10 1016

## 2010-11-29 NOTE — Progress Notes (Signed)
CSW contacted patient's sister Larene Pickett [phone]) regarding patient status. She is aware that patient will be going to a SNF for ST rehab in [facility] and probable d/c date is Monday, 11/26, if patient medically stable. Ms. Ernest Mallick was advised that she will be contacted on Monday.  Genelle Bal, MSW, LCSW [phone]

## 2010-11-29 NOTE — Progress Notes (Signed)
Physical Therapy Treatment Patient Details Name: Lauren Morrow MRN: [id number] DOB: 1924-09-23 Today's Date: 11/29/2010  PT Assessment/Plan  PT - Assessment/Plan Comments on Treatment Session: dyspnea 1-2/4 PT Plan: Discharge plan remains appropriate Follow Up Recommendations: Skilled nursing facility Equipment Recommended: Defer to next venue PT Goals  Acute Rehab PT Goals PT Goal: Supine/Side to Sit - Progress: Other (comment) (not addressed today) PT Transfer Goal: Sit to Stand/Stand to Sit - Progress: Progressing toward goal PT Goal: Ambulate - Progress: Progressing toward goal PT Goal: Up/Down Stairs - Progress: Other (comment) (not adressed today) PT Goal: Perform Home Exercise Program - Progress: Progressing toward goal Additional Goals PT Goal: Additional Goal #1 - Progress: Progressing toward goal  PT Treatment Precautions/Restrictions  Precautions Precautions: Sternal;Fall Required Braces or Orthoses: No Restrictions Weight Bearing Restrictions: No Mobility (including Balance) Bed Mobility Bed Mobility: No Transfers Transfers: Yes Sit to Stand: 4: Min assist;Without upper extremity assist;From chair/3-in-1 Sit to Stand Details (indicate cue type and reason): vc to not use her UE's for standing; manual A for forward w/shift Stand to Sit: 5: Supervision;Without upper extremity assist;To chair/3-in-1 Stand to Sit Details: no cuing needed Ambulation/Gait Ambulation/Gait: Yes Ambulation/Gait Assistance: Other (comment) (min guard A) Ambulation/Gait Assistance Details (indicate cue type and reason): with and without RW; mildly more guarded gait without RW, but still steady and safe Ambulation Distance (Feet): 350 Feet Assistive device: Rolling walker;None Gait Pattern: Within Functional Limits;Step-through pattern (mildly more tentative without A device)    Exercise  Total Joint Exercises Ankle Circles/Pumps: Other (comment) (she has been doing these without  cuing) Long Arc Quad: 10 reps;Both;AROM;Seated General Exercises - Lower Extremity Hip Flexion/Marching: AROM;10 reps;Both;Seated End of Session PT - End of Session Activity Tolerance: Patient tolerated treatment well Patient left: in chair General Behavior During Session: [facility] for tasks performed Cognition: [facility] for tasks performed  Faten Frieson, Eliseo Gum 11/29/2010, 2:56 PM  11/29/2010  [place] [facility], PT [phone] (pager)

## 2010-11-29 NOTE — Progress Notes (Addendum)
7 Days Post-Op Procedure(s) (LRB): AORTIC VALVE REPLACEMENT (AVR) (N/A)  Subjective:   Objective: Vital signs in last 24 hours: Patient Vitals for the past 24 hrs:  BP Temp Temp src Pulse Resp SpO2 Weight  11/29/10 0530 156/79 mmHg 98.8 F (37.1 C) Oral 74  18  92 % 157 lb 13.6 oz (71.6 kg)  11/28/10 2018 137/81 mmHg 98.6 F (37 C) Oral 92  18  93 % 157 lb 13.6 oz (71.6 kg)  11/28/10 1800 111/55 mmHg - - 92  18  - -  11/28/10 1510 103/62 mmHg 98.2 F (36.8 C) Oral 71  20  92 % -  11/28/10 1015 147/57 mmHg - - 78  - - -   Pre op weight  67.6 kg Current Weight  11/29/10 157 lb 13.6 oz (71.6 kg)       Intake/Output from previous day: 11/22 0701 - 11/23 0700 In: 903 [P.O.:900; I.V.:3] Out: 2000 [Urine:2000]   Physical Exam:  Cardiovascular: IRRR, no murmurs, gallops, or rubs. Pulmonary: Clear to auscultation bilaterally; no rales, wheezes, or rhonchi. Abdomen: Soft, non tender, bowel sounds present. Extremities: No lower extremity edema. Wound: Clean and dry.  No erythema or signs of infection.  Lab Results: CBC: No results found for this basename: WBC:2,HGB:2,HCT:2,PLT:2 in the last 72 hours BMET:  No results found for this basename: NA:2,K:2,CL:2,CO2:2,GLUCOSE:2,BUN:2,CREATININE:2,CALCIUM:2 in the last 72 hours  PT/INR: No results found for this basename: LABPROT,INR in the last 72 hours  INR: Will add last result for INR, ABG once components are confirmed Will add last 4 CBG results once components are confirmed  Assessment/Plan:  1. CV - Previous PAF and  A paced at 70. Was SR but went into Afib (CVR) around 4:30 pm 11/22. Continue Amiodarone 200 bid,Lisinopril to 40 mg daily, Lopressor 12.5 bid.  Norvasc has helped better control blood pressure. Start low dose Coumadin. 2.  Pulmonary - Encourage incentive spirometer. 3. Volume Overload - Continue with diuresis. 4.  Acute blood loss anemia - H/H stable at 9.4/28.4.   5.Conitnue with CRPI. 6. Will need SNF  when ready for discharge (likely Monday).                       [address]            [place] [address]          [phone]      patient examined and medical record reviewed,agree with above note. VAN TRIGT III,Chalsea Darko 11/29/2010

## 2010-11-30 LAB — GLUCOSE, CAPILLARY
Glucose-Capillary: 90 mg/dL (ref 70–99)
Glucose-Capillary: 88 mg/dL (ref 70–99)
Glucose-Capillary: 95 mg/dL (ref 70–99)

## 2010-11-30 NOTE — Progress Notes (Signed)
CARDIAC REHAB PHASE I   PRE:  Rate/Rhythm: 67 SR  BP:  Supine: 142/63  Sitting:   Standing:   SaO2: 95 RA  MODE:  Ambulation: 500 ft   POST:  Rate/Rhythem: 87 SR  BP:  Supine:   Sitting: 134/58  Standing:   SaO2: 95 RA  Lauren Morrow   Pt ambulated 500 ft assist x 1 with rolling walker. Tolerated very well. Pt stated she was "tired" towards end of ambulation, and had very mild dyspnea, but no other symptoms reported. Pt's SAO2 remained in mid-nineties throughout ambulation. Pt took two brief rest breaks. Very stable with rolling walker. Returned to bedside. Vital signs stable. Instructed pt to ambulate with RN this afternoon. Pt voiced understanding. Call bell in reach. Will follow up. Harriett Sine MS 11/30/10 1010 - 1035

## 2010-11-30 NOTE — Progress Notes (Addendum)
8 Days Post-Op  Procedure(s) (LRB): AORTIC VALVE REPLACEMENT (AVR) (N/A) Subjective: Looks and feels well, no new complaints other than not sleeping very well.  Objective  Telemetry NSR  Temp:  [97.8 F (36.6 C)-98.5 F (36.9 C)] 97.8 F (36.6 C) (11/24 0433) Pulse Rate:  [67-75] 67  (11/24 0433) Resp:  [18-24] 24  (11/24 0433) BP: (92-136)/(46-79) 136/79 mmHg (11/24 0433) SpO2:  [91 %-92 %] 92 % (11/24 0433) Weight:  [158 lb 4.6 oz (71.8 kg)] 158 lb 4.6 oz (71.8 kg) (11/24 0433)   Intake/Output Summary (Last 24 hours) at 11/30/10 0815 Last data filed at 11/30/10 0800  Gross per 24 hour  Intake    960 ml  Output    602 ml  Net    358 ml       General appearance: alert, cooperative and no distress Heart: RRR, soft systolic flow murmur Lungs: clear to auscultation bilaterally Abdomen: soft, non-tender; bowel sounds normal; no masses,  no organomegaly Extremities: no edema Wound: incision healing well without signs of infection  Lab Results: No results found for this basename: NA:2,K:2,CL:2,CO2:2,GLUCOSE:2,BUN:2,CREATININE:2,CALCIUM:2,MG:2,PHOS:2 in the last 72 hours No results found for this basename: AST:2,ALT:2,ALKPHOS:2,BILITOT:2,PROT:2,ALBUMIN:2 in the last 72 hours No results found for this basename: LIPASE:2,AMYLASE:2 in the last 72 hours No results found for this basename: WBC:2,NEUTROABS:2,HGB:2,HCT:2,MCV:2,PLT:2 in the last 72 hours No results found for this basename: CKTOTAL:4,CKMB:4,TROPONINI:4 in the last 72 hours No results found for this basename: POCBNP:3 in the last 72 hours No results found for this basename: DDIMER in the last 72 hours No results found for this basename: HGBA1C in the last 72 hours No results found for this basename: CHOL,HDL,LDLCALC,TRIG,CHOLHDL in the last 72 hours No results found for this basename: TSH,T4TOTAL,FREET3,T3FREE,THYROIDAB in the last 72 hours No results found for this basename:  VITAMINB12,FOLATE,FERRITIN,TIBC,IRON,RETICCTPCT in the last 72 hours  Medications: Scheduled    . amiodarone  200 mg Oral BID  . amLODipine  5 mg Oral Daily  . aspirin EC  81 mg Oral Daily  . furosemide  40 mg Oral Daily  . lisinopril  20 mg Oral BID  . metoprolol tartrate  12.5 mg Oral BID  . patient's guide to using coumadin book   Does not apply Once  . potassium chloride  20 mEq Oral Daily  . povidone-iodine  1 application Topical BID  . sodium chloride  3 mL Intravenous Q12H  . warfarin  2.5 mg Oral q1800  . warfarin   Does not apply Once  . DISCONTD: amLODipine  10 mg Oral Daily     Radiology/Studies:  No results found.  INR: Will add last result for INR, ABG once components are confirmed Will add last 4 CBG results once components are confirmed  Assessment/Plan: S/P Procedure(s) (LRB): AORTIC VALVE REPLACEMENT (AVR) (N/A)  1. Maintaining NSR on current RX 2. Conts with gentle diuresis 3.routine pulm toilet 4. Routine rehab 5. INR 1.22 today starting slow coumadin dosing at 2.5 mg daily for now.    LOS: 8 days    GOLD,WAYNE E 11/24/20128:15 AM    Chart reviewed, patient examined, agree with above. Plan to SNF on monday

## 2010-12-01 LAB — PROTIME-INR
INR: 1.18 (ref 0.00–1.49)
Prothrombin Time: 15.3 seconds — ABNORMAL HIGH (ref 11.6–15.2)

## 2010-12-01 MED ORDER — WARFARIN SODIUM 5 MG PO TABS
5.0000 mg | ORAL_TABLET | Freq: Every day | ORAL | Status: DC
  Administered 2010-12-01: 5 mg via ORAL
  Filled 2010-12-01 (×2): qty 1

## 2010-12-01 NOTE — Discharge Summary (Signed)
Lauren Morrow 1924/03/21 77 y.o. [id number]  11/22/2010   Kathlee Nations Suann Larry, MD  AORTIC STENOSIS   HPI:  This is a 77 y.o. female who is referred to Dr. Donata Clay for recently diagnosed severe aortic stenosis. The patient has a long history of a cardiac murmur. This has been asymptomatic until recently when she developed repeated episodes of presyncope and shortness of breath with exertion. A 2-D echocardiogram was performed by Dr. Sherril Croon which showed left ventricular hypertrophy, ejection fraction of 60%, and a heavily calcified aortic valve with severe aortic stenosis. The valve area was calculated at 0.6 cm. A subsequent left and right heart cardiac catheterization by Dr. Kennith Maes on October 31 demonstrated severe aortic stenosis with PA pressures of 20/10, cardiac output of 3.5, mixed venous oxygen saturation of 67%, and normal coronary arteries. The valve could not be crossed. Based on the patient's symptoms and findings an echocardiogram and catheterization aortic valve replacement was recommended. The patient was evaluated as were her records and deemed to be an acceptable candidate for proceeding with surgery.   Hospital Course:  The patient was admitted to the hospital and taken to the operating room on 11/22/2010 and underwent Procedure(s): AORTIC VALVE REPLACEMENT (AVR). A 21 mm paracardial valve was placed. This was then Kenney with serial [id number]. The patient tolerated the procedure well and was taken to the surgical intensive care unit in stable condition  Postoperative hospital course:  Overall the patient has progressed nicely. She has had difficulty with postoperative atrial fibrillation. She has subsequently been chemically cardioverted to normal sinus rhythm. She is also on Coumadin for anticoagulation therapy. Additionally she has had some postoperative volume overload requiring aggressive diuresis. She has responded well to this. She also has an acute blood loss anemia  stabilized. Her most recent hematocrit dated 11/26/2010 is 28.4. Her most recent INR is 1.18. She is responding well to routine rehabilitation however, due to her age and deconditioning it is felt that she would best be suited to spend further time and a skilled nursing facility for ongoing rehabilitation. Her incisions are healing well without evidence of infection. She is tolerating diet reasonably well. Oxygen has been weaned and she maintains good saturations on room air. Tentatively she is felt to be stable for transfer to the skilled nursing facility in the morning of 12/02/2010 pending further observation of her clinical recovery and status.   No results found for this basename: NA:2,K:2,CL:2,CO2:2,GLUCOSE:2,BUN:2,CRATININE:2,CALCIUM:2 in the last 72 hours No results found for this basename: WBC:2,HGB:2,HCT:2,PLT:2 in the last 72 hours  Basename 12/01/10 0600 11/30/10 0600  INR 1.18 1.22     Discharge Instructions:  The patient is discharged to home with extensive instructions on wound care and progressive ambulation.  They are instructed not to drive or perform any heavy lifting until returning to see the physician in his office.  Discharge Diagnosis:  AORTIC STENOSIS  Secondary Diagnosis: Patient Active Problem List  Diagnoses  . Aortic stenosis  . Dizziness  . Hypertension   Past Medical History  Diagnosis Date  . Varicose veins   . Seasonal allergies   . Osteomyelitis     history of osteomyelistis in the leg  . Aortic stenosis   . Dizziness   . Hypertension        Dalaya, Suppa  Home Medication Instructions [id number]   Printed on:12/01/10 1350  Medication Information  Multiple Vitamin (MULTIVITAMIN) tablet Take 1 tablet by mouth daily.             cholecalciferol (VITAMIN D) 400 UNITS TABS Take 400 Units by mouth daily.             b complex vitamins tablet Take 1 tablet by mouth daily.             Omega-3 Fatty Acids (FISH OIL)  300 MG CAPS Take 1 capsule by mouth daily.             Calcium-Magnesium-Zinc 1000-400-15 MG TABS Take 1 tablet by mouth daily.             aspirin EC 81 MG tablet Take 81 mg by mouth daily.             amiodarone (PACERONE) 200 MG tablet Take 1 tablet (200 mg total) by mouth 2 (two) times daily.           amLODipine (NORVASC) 5 MG tablet Take 1 tablet (5 mg total) by mouth daily.           diphenhydrAMINE (BENADRYL) 12.5 MG/5ML elixir Take 10 mLs (25 mg total) by mouth at bedtime as needed for sleep.           guaiFENesin-dextromethorphan (ROBITUSSIN DM) 100-10 MG/5ML syrup Take 15 mLs by mouth 4 (four) times daily as needed for cough.           lisinopril (PRINIVIL,ZESTRIL) 20 MG tablet Take 1 tablet (20 mg total) by mouth 2 (two) times daily.           metoprolol tartrate (LOPRESSOR) 12.5 mg TABS Take 0.5 tablets (12.5 mg total) by mouth 2 (two) times daily.           traMADol (ULTRAM) 50 MG tablet Take 1-2 tablets (50-100 mg total) by mouth every 6 (six) hours as needed for pain. Maximum dose= 8 tablets per day           furosemide (LASIX) 40 MG tablet Take 1 tablet (40 mg total) by mouth daily. For 5 days then stop.           potassium chloride SA (K-DUR,KLOR-CON) 20 MEQ tablet Take 1 tablet (20 mEq total) by mouth daily. For 5 days then stop.           warfarin (COUMADIN) 2.5 MG tablet Take 1 tablet (2.5 mg total) by mouth daily at 6 PM. Or as directed by Dr. Jari Sportsman office             Disposition: To skilled nursing facility  Patient's condition is good  Gershon Crane, PA-C 12/01/2010  1:50 PM

## 2010-12-01 NOTE — Progress Notes (Signed)
EPW discontinued per protocol. Tips intact. Patient tolerated well. Last INR INR/Prothrombin Time on    .  Patient advised Bedrest X 1 hour.  Marigene Ehlers RN see vital signs for post procedure.

## 2010-12-01 NOTE — Progress Notes (Addendum)
9 Days Post-Op  Procedure(s) (LRB): AORTIC VALVE REPLACEMENT (AVR) (N/A) Subjective: Continues  to feel better, rested better, not SOB. Ambulation continues to improve.  Objective  Telemetry Atrial fibrillation converted to NSR  Temp:  [98.3 F (36.8 C)] 98.3 F (36.8 C) (11/25 0431) Pulse Rate:  [62-76] 62  (11/25 0431) Resp:  [20] 20  (11/25 0431) BP: (104-117)/(66-71) 117/71 mmHg (11/25 0431) SpO2:  [93 %-95 %] 93 % (11/25 0431) Weight:  [149 lb 7.6 oz (67.8 kg)] 149 lb 7.6 oz (67.8 kg) (11/25 0431)   Intake/Output Summary (Last 24 hours) at 12/01/10 0853 Last data filed at 11/30/10 1700  Gross per 24 hour  Intake    240 ml  Output    501 ml  Net   -261 ml       General appearance: alert, cooperative and no distress Heart: regular rate and rhythm and S1, S2 normal Lungs: clear to auscultation bilaterally Abdomen: soft, non-tender; bowel sounds normal; no masses,  no organomegaly Extremities: extremities normal, atraumatic, no cyanosis or edema Wound: incisions healing well without signs of infection  Lab Results: No results found for this basename: NA:2,K:2,CL:2,CO2:2,GLUCOSE:2,BUN:2,CREATININE:2,CALCIUM:2,MG:2,PHOS:2 in the last 72 hours No results found for this basename: AST:2,ALT:2,ALKPHOS:2,BILITOT:2,PROT:2,ALBUMIN:2 in the last 72 hours No results found for this basename: LIPASE:2,AMYLASE:2 in the last 72 hours No results found for this basename: WBC:2,NEUTROABS:2,HGB:2,HCT:2,MCV:2,PLT:2 in the last 72 hours No results found for this basename: CKTOTAL:4,CKMB:4,TROPONINI:4 in the last 72 hours No results found for this basename: POCBNP:3 in the last 72 hours No results found for this basename: DDIMER in the last 72 hours No results found for this basename: HGBA1C in the last 72 hours No results found for this basename: CHOL,HDL,LDLCALC,TRIG,CHOLHDL in the last 72 hours No results found for this basename: TSH,T4TOTAL,FREET3,T3FREE,THYROIDAB in the last 72  hours No results found for this basename: VITAMINB12,FOLATE,FERRITIN,TIBC,IRON,RETICCTPCT in the last 72 hours  Medications: Scheduled    . amiodarone  200 mg Oral BID  . amLODipine  5 mg Oral Daily  . aspirin EC  81 mg Oral Daily  . furosemide  40 mg Oral Daily  . lisinopril  20 mg Oral BID  . metoprolol tartrate  12.5 mg Oral BID  . potassium chloride  20 mEq Oral Daily  . povidone-iodine  1 application Topical BID  . sodium chloride  3 mL Intravenous Q12H  . warfarin  2.5 mg Oral q1800  . warfarin   Does not apply Once     Radiology/Studies:  No results found.  INR: Will add last result for INR, ABG once components are confirmed Will add last 4 CBG results once components are confirmed  Assessment/Plan: S/P Procedure(s) (LRB): AORTIC VALVE REPLACEMENT (AVR) (N/A) 1.Continues to progress nicely 2. INR 1.18 , will increase coumadin dose 3.Currently maintaining NSR 4. Poss tx to SNF in am     LOS: 9 days    GOLD,WAYNE E 11/25/20128:53 AM    Chart reviewed, patient examined, agree with above.

## 2010-12-02 LAB — PROTIME-INR: Prothrombin Time: 15.6 seconds — ABNORMAL HIGH (ref 11.6–15.2)

## 2010-12-02 MED ORDER — POTASSIUM CHLORIDE CRYS ER 20 MEQ PO TBCR: 20.0000 meq | EXTENDED_RELEASE_TABLET | Freq: Every day | ORAL | Status: DC

## 2010-12-02 MED ORDER — FUROSEMIDE 40 MG PO TABS: 40.0000 mg | ORAL_TABLET | Freq: Every day | ORAL | Status: DC

## 2010-12-02 MED ORDER — WARFARIN SODIUM 5 MG PO TABS: 5.0000 mg | ORAL_TABLET | Freq: Every day | ORAL | Status: DC

## 2010-12-02 MED ORDER — TRAMADOL HCL 50 MG PO TABS: 50.0000 mg | ORAL_TABLET | Freq: Four times a day (QID) | ORAL | Status: AC | PRN

## 2010-12-02 NOTE — Progress Notes (Signed)
Pt discharge to SNF via ambulance. Pt discharge and medications instructions placed in packet.

## 2010-12-02 NOTE — Progress Notes (Signed)
CSW has coordinated pt transfer to [facility]. RN is notified and CSW has called [facility] for transport. CSW signing off.    Baxter Flattery, MSW [phone]

## 2010-12-02 NOTE — Progress Notes (Signed)
10 Days Post-Op Procedure(s) (LRB): AORTIC VALVE REPLACEMENT (AVR) (N/A)  Subjective: Patient eating breakfast without complaints.  Objective: Vital signs in last 24 hours: Patient Vitals for the past 24 hrs:  BP Temp Temp src Pulse Resp SpO2 Weight  12/02/10 0338 131/77 mmHg 98.2 F (36.8 C) Oral 87  18  93 % 146 lb 8 oz (66.452 kg)  12/01/10 2142 128/76 mmHg 98.9 F (37.2 C) Oral 76  18  93 % -  12/01/10 1612 100/56 mmHg - - 66  18  98 % -  12/01/10 1513 95/60 mmHg 97.2 F (36.2 C) Oral 67  18  97 % -  12/01/10 1405 105/47 mmHg - - 61  18  96 % -  12/01/10 1335 112/56 mmHg - - 62  18  95 % -  12/01/10 1305 117/56 mmHg - - 66  20  93 % -  12/01/10 1235 122/59 mmHg - - 70  18  95 % -  12/01/10 1220 120/55 mmHg - - 68  20  94 % -  12/01/10 1204 123/53 mmHg - - 70  18  95 % -  12/01/10 1151 125/53 mmHg - - 63  20  94 % -  12/01/10 1120 136/58 mmHg - - 63  18  96 % -   Pre op weight  67.6kg Current Weight  12/02/10 146 lb 8 oz (66.452 kg)    Hemodynamic parameters for last 24 hours:    Intake/Output from previous day: 11/25 0701 - 11/26 0700 In: 480 [P.O.:480] Out: 1 [Urine:1]   Physical Exam:  Cardiovascular: IRRR, no murmurs, gallops, or rubs. Pulmonary: Clear to auscultation bilaterally; no rales, wheezes, or rhonchi. Abdomen: Soft, non tender, bowel sounds present. Extremities: No lower extremity edema. Wound: Clean and dry.  No erythema or signs of infection.  Lab Results: CBC:No results found for this basename: WBC:2,HGB:2,HCT:2,PLT:2 in the last 72 hours BMET: No results found for this basename: NA:2,K:2,CL:2,CO2:2,GLUCOSE:2,BUN:2,CREATININE:2,CALCIUM:2 in the last 72 hours  PT/INR:  Basename 12/01/10 0600  LABPROT 15.3*  INR 1.18   ABG:  INR: Will add last result for INR, ABG once components are confirmed Will add last 4 CBG results once components are confirmed  Assessment/Plan:  1. CV - PAF with CVR.  Continue current medications. 2.  Pulmonary -  Stable 3. Volume Overload - Has diuresed well. 4.  Acute blood loss anemia - Stable. 5.Remove CT sutures. 6.Surgically stable for discharge to SNF.   Ardelle Balls, PA 12/02/2010

## 2010-12-02 NOTE — Progress Notes (Addendum)
Cardiac Rehab - 0840 - 0910 Pt education done for d/c with verbal understanding. Pt education network on for pt for coumadin. Agrees to out pt cardiac rehab [facility].   Rosalie Doctor

## 2010-12-03 ENCOUNTER — Encounter: Payer: Self-pay | Admitting: *Deleted

## 2010-12-03 ENCOUNTER — Telehealth: Payer: Self-pay | Admitting: Thoracic Surgery (Cardiothoracic Vascular Surgery)

## 2010-12-03 DIAGNOSIS — Z954 Presence of other heart-valve replacement: Secondary | ICD-10-CM

## 2010-12-03 NOTE — Telephone Encounter (Signed)
Patient just discharged from hospital to SNF in [place].  Sent to ED at [facility] because of alleged low BP (SBP 86 mmHg) and low-grade fever.  According to physician in ED at [facility] patient reportedly feeling fine with absolutely no complaints, no fever, and SBP >130 mmHg on all recordings.  Physical exam sounded entirely benign.  Blood work notable for WBC 11k (most recent 10k prior to d/c).  Portable CXR reviewed and unchanged/improved since most recent prior to d/c.  ED physician contemplating d/c back to SNF without change versus adding po antibiotics versus readmission to hospital with or without transfer back to [facility].  I offered early follow up appt in the office as alternative if patient elected not to stay in the hospital, but the patient's condition did not sound worrisome enough to warrant emergency transfer back to [facility] via [facility] in the middle of the night. I suggested that a reasonable alternative might be overnight observation there at [facility] with d/c back to SNF later this morning assuming her stability, given the current late hour.  All questions answered.  Lauren Morrow H 12:42 AM

## 2010-12-11 ENCOUNTER — Encounter: Payer: Self-pay | Admitting: *Deleted

## 2010-12-19 ENCOUNTER — Other Ambulatory Visit: Payer: Self-pay | Admitting: Cardiothoracic Surgery

## 2010-12-19 DIAGNOSIS — I359 Nonrheumatic aortic valve disorder, unspecified: Secondary | ICD-10-CM

## 2010-12-20 ENCOUNTER — Encounter: Payer: Self-pay | Admitting: *Deleted

## 2010-12-23 ENCOUNTER — Ambulatory Visit
Admission: RE | Admit: 2010-12-23 | Discharge: 2010-12-23 | Disposition: A | Discharge status: Home / Self Care | Payer: Medicare Other | Source: Ambulatory Visit | Attending: Cardiothoracic Surgery | Admitting: Cardiothoracic Surgery

## 2010-12-23 ENCOUNTER — Ambulatory Visit (INDEPENDENT_AMBULATORY_CARE_PROVIDER_SITE_OTHER): Payer: Self-pay | Admitting: Physician Assistant

## 2010-12-23 VITALS — BP 130/68 | HR 72 | Resp 20 | Ht 64.0 in | Wt 150.0 lb

## 2010-12-23 DIAGNOSIS — Z954 Presence of other heart-valve replacement: Secondary | ICD-10-CM

## 2010-12-23 DIAGNOSIS — I359 Nonrheumatic aortic valve disorder, unspecified: Secondary | ICD-10-CM

## 2010-12-23 DIAGNOSIS — Z952 Presence of prosthetic heart valve: Secondary | ICD-10-CM

## 2010-12-23 DIAGNOSIS — I350 Nonrheumatic aortic (valve) stenosis: Secondary | ICD-10-CM

## 2010-12-23 NOTE — Patient Instructions (Signed)
Stop Lasix and potassium after tonight's doses Continue physical therapy and walk daily

## 2010-12-23 NOTE — Progress Notes (Signed)
Subjective: The patient presents today for a 3 week postoperative followup.she is status post an aortic valve replacement with an Edwards 21 mm pericardial tissue valve performed by Dr. Donata Clay on 11/22/2010. Her postoperative course was complicated by atrial fibrillation. She converted to sinus rhythm and was discharged on amiodarone, Lopressor,and Coumadin.  She also was restarted on lisinopril and Norvasc for hypertension prior to discharge. She was discharged to a skilled nursing facility on 12/02/2010. Reportedly she developed hypotension after her admission at the SNF and was transported to [facility] for further evaluation. According to her family members she was admitted for 2 days and treated for a UTI. Several of her blood pressure medications were discontinued as well. She returned to the nursing facility and has progressed well since that time. She was discharged back to home on 12/20/2010.  She is feeling well and is improving with mobility. Physical therapy is assisting her at home. Her appetite is improving. She has had quite a bit of nausea and is taking Zofran fairly regularly.She continues to have some chest soreness but denies shortness of breath, dizziness or lower extremity edema. She has a followup appointment with Dr. Kirke Corin this week. She did have a PT and INR drawn prior to her discharge from the SNF but her family is not sure of the results.  Objective: Blood pressure 130/60, pulse 72, respirations 20 Gen.- no acute distress Incisions are all healing well and sternum is stable to palpation Heart regular rate and rhythm without murmurs rubs or Gallops Lungs clear Extremities no edema  Chest x-ray shows resolved bilateral effusions  Assessment and plan: The patient is doing well status post aVR. She has a followup appointment with Dr. Kirke Corin this week, and since she is maintaining sinus rhythm, hopefully her amiodarone can be discontinued since this is a potential source  for her nausea. Her blood pressure today in the office is good. She does not appear to be clinically volume overloaded. She has one more dose of Lasix from the SNF and I don't feel that she will need a refill. She will continue with physical therapy as directed. We will see her back on a when necessary basis.

## 2010-12-24 ENCOUNTER — Ambulatory Visit (INDEPENDENT_AMBULATORY_CARE_PROVIDER_SITE_OTHER): Payer: Self-pay | Admitting: Cardiology

## 2010-12-24 DIAGNOSIS — Z954 Presence of other heart-valve replacement: Secondary | ICD-10-CM

## 2010-12-24 DIAGNOSIS — I350 Nonrheumatic aortic (valve) stenosis: Secondary | ICD-10-CM

## 2010-12-24 DIAGNOSIS — Z7901 Long term (current) use of anticoagulants: Secondary | ICD-10-CM | POA: Insufficient documentation

## 2010-12-24 DIAGNOSIS — R0989 Other specified symptoms and signs involving the circulatory and respiratory systems: Secondary | ICD-10-CM

## 2010-12-24 DIAGNOSIS — I4891 Unspecified atrial fibrillation: Secondary | ICD-10-CM | POA: Insufficient documentation

## 2010-12-26 ENCOUNTER — Encounter: Payer: Self-pay | Admitting: Cardiovascular Disease

## 2010-12-26 ENCOUNTER — Ambulatory Visit (INDEPENDENT_AMBULATORY_CARE_PROVIDER_SITE_OTHER): Payer: Medicare Other | Admitting: Cardiovascular Disease

## 2010-12-26 VITALS — BP 112/70 | HR 67 | Ht 66.5 in | Wt 145.0 lb

## 2010-12-26 DIAGNOSIS — I350 Nonrheumatic aortic (valve) stenosis: Secondary | ICD-10-CM

## 2010-12-26 DIAGNOSIS — I4891 Unspecified atrial fibrillation: Secondary | ICD-10-CM

## 2010-12-26 DIAGNOSIS — I359 Nonrheumatic aortic valve disorder, unspecified: Secondary | ICD-10-CM

## 2010-12-26 MED ORDER — METOPROLOL SUCCINATE ER 25 MG PO TB24: 25.0000 mg | ORAL_TABLET | Freq: Every day | ORAL | Status: DC

## 2010-12-26 NOTE — Progress Notes (Signed)
HPI  This is an 77 year old female who is here today for a followup visit. She underwent aortic valve replacement with a pericardial valve last month for severe aortic stenosis. Her preoperative cardiac cath showed no significant obstructive coronary artery disease. Postoperative course was complicated by atrial fibrillation which was controlled with amiodarone. The patient was discharged home to a skilled nursing facility for rehabilitation. She was readmitted to [facility] due to weakness. She was found to be hypotensive and also had a urinary tract infection. Her blood pressure medications were discontinued. The patient is now back home. She overall feels much better. Her main issue seems to be nausea and constipation. She has been taking Zofran almost on a daily basis. Her dyspnea has improved from before. She has not had any arrhythmia since her hospital discharge.  Allergies  Allergen Reactions  . Avelox (Moxifloxacin Hcl In Nacl) Rash    Avelox began at approximately 0800. At 0807 pt noted to have red-streaking proximal to the PIV site, up the left arm, to the neck/chest area. Avelox immediately discontinued. Pt received about 1/4 the dose or 100mg . Vital signs remained stable. The anesthesiologist and surgeon were notified and shown the site of red, rashy streaking.  Marland Kitchen Penicillins Swelling  . Sulfa Antibiotics Other (See Comments)    unknown     Current Outpatient Prescriptions on File Prior to Visit  Medication Sig Dispense Refill  . cholecalciferol (VITAMIN D) 400 UNITS TABS Take 400 Units by mouth daily.        . mirtazapine (REMERON) 15 MG tablet Take 15 mg by mouth at bedtime.        . Multiple Vitamin (MULTIVITAMIN) tablet Take 1 tablet by mouth daily.        . ondansetron (ZOFRAN) 4 MG tablet Take 4 mg by mouth every 6 (six) hours as needed.       . sertraline (ZOLOFT) 50 MG tablet Take 50 mg by mouth daily.       . traMADol (ULTRAM) 50 MG tablet Take 50-100 mg by mouth  every 6 (six) hours as needed.       . warfarin (COUMADIN) 5 MG tablet Take 1 tablet (5 mg total) by mouth daily at 6 PM. Or as directed by Dr. Jari Sportsman office  30 tablet  0     Past Medical History  Diagnosis Date  . Varicose veins   . Seasonal allergies   . Osteomyelitis     history of osteomyelistis in the leg  . Aortic stenosis   . Dizziness   . Hypertension   . Atrial fibrillation     Post op     Past Surgical History  Procedure Date  . Spine surgery 1967  1976    dr. Ollen Bowl  . Aortic valve replacement 11/22/2010    21mm pericardial valve serial#[id number]     No family history on file.   History   Social History  . Marital Status: Widowed    Spouse Name: N/A    Number of Children: N/A  . Years of Education: N/A   Occupational History  . Not on file.   Social History Main Topics  . Smoking status: Never Smoker   . Smokeless tobacco: Never Used  . Alcohol Use: No  . Drug Use: No  . Sexually Active: Not on file   Other Topics Concern  . Not on file   Social History Narrative  . No narrative on file      PHYSICAL EXAM  BP 112/70  Pulse 67  Ht 5' 6.5" (1.689 m)  Wt 145 lb (65.772 kg)  BMI 23.05 kg/m2  Constitutional: She is oriented to person, place, and time. She appears well-developed and well-nourished. No distress.  HENT: No nasal discharge.  Head: Normocephalic and atraumatic.  Eyes: Pupils are equal, round, and reactive to light. Right eye exhibits no discharge. Left eye exhibits no discharge.  Neck: Normal range of motion. Neck supple. No JVD present. No thyromegaly present.  Cardiovascular: Normal rate, regular rhythm, normal heart sounds and intact distal pulses. Exam reveals no gallop and no friction rub.  There is a 2/6 systolic ejection murmur at the aortic area. Pulmonary/Chest: Effort normal and breath sounds normal. No stridor. No respiratory distress. She has no wheezes. She has no rales. She exhibits no tenderness.    Abdominal: Soft. Bowel sounds are normal. She exhibits no distension. There is no tenderness. There is no rebound and no guarding.  Musculoskeletal: Normal range of motion. She exhibits no edema and no tenderness.  Neurological: She is alert and oriented to person, place, and time. Coordination normal.  Skin: Skin is warm and dry. No rash noted. She is not diaphoretic. No erythema. No pallor.  Psychiatric: She has a normal mood and affect. Her behavior is normal. Judgment and thought content normal.    EKG: Normal sinus rhythm with no significant ST or T wave changes.   ASSESSMENT AND PLAN

## 2010-12-26 NOTE — Patient Instructions (Signed)
Follow up as scheduled. Stop Amiodarone Stop Klor-con (potassium) Start Toprol XL (metoprolol) 25 mg daily. Have Home Health check your Coumadin next week and call to [facility].

## 2010-12-26 NOTE — Assessment & Plan Note (Addendum)
She is status post recent aortic valve replacement with a bioprosthetic valve. Overall, her recovery was remarkable. I recommend continuing  Anticoagulation for 6 months according to the new guidelines for anticoagulation post bioprosthetic valves. If however she develops recurrent atrial fibrillation then she will need long-term anticoagulation beyond 6 months. She appears to be euvolemic. She is off diuretics. I will discontinue her potassium supplement.

## 2010-12-26 NOTE — Assessment & Plan Note (Signed)
This was only postoperative after aortic valve replacement. Since then, she has maintained normal sinus rhythm. Her GI symptoms especially nausea is likely related to amiodarone. Given that it has been more than 4 weeks since her surgery, I will stop amiodarone. I will start her instead on Toprol XL 25 mg once daily. Continue anticoagulation as outlined above. Will get her INR checked next week do to discontinuation of amiodarone.

## 2011-01-09 ENCOUNTER — Ambulatory Visit (INDEPENDENT_AMBULATORY_CARE_PROVIDER_SITE_OTHER): Payer: Self-pay | Admitting: *Deleted

## 2011-01-09 DIAGNOSIS — Z7901 Long term (current) use of anticoagulants: Secondary | ICD-10-CM

## 2011-01-09 DIAGNOSIS — I4891 Unspecified atrial fibrillation: Secondary | ICD-10-CM

## 2011-01-09 DIAGNOSIS — I350 Nonrheumatic aortic (valve) stenosis: Secondary | ICD-10-CM

## 2011-01-09 DIAGNOSIS — R0989 Other specified symptoms and signs involving the circulatory and respiratory systems: Secondary | ICD-10-CM

## 2011-01-09 DIAGNOSIS — Z954 Presence of other heart-valve replacement: Secondary | ICD-10-CM

## 2011-01-09 LAB — POCT INR: INR: 1.2

## 2011-01-15 ENCOUNTER — Ambulatory Visit (INDEPENDENT_AMBULATORY_CARE_PROVIDER_SITE_OTHER): Payer: Self-pay | Admitting: *Deleted

## 2011-01-15 DIAGNOSIS — R0989 Other specified symptoms and signs involving the circulatory and respiratory systems: Secondary | ICD-10-CM

## 2011-01-15 DIAGNOSIS — I350 Nonrheumatic aortic (valve) stenosis: Secondary | ICD-10-CM

## 2011-01-15 DIAGNOSIS — I4891 Unspecified atrial fibrillation: Secondary | ICD-10-CM

## 2011-01-15 DIAGNOSIS — Z954 Presence of other heart-valve replacement: Secondary | ICD-10-CM

## 2011-01-15 DIAGNOSIS — Z7901 Long term (current) use of anticoagulants: Secondary | ICD-10-CM

## 2011-01-15 LAB — POCT INR: INR: 1.7

## 2011-01-20 ENCOUNTER — Ambulatory Visit (INDEPENDENT_AMBULATORY_CARE_PROVIDER_SITE_OTHER): Payer: Self-pay | Admitting: *Deleted

## 2011-01-20 DIAGNOSIS — R0989 Other specified symptoms and signs involving the circulatory and respiratory systems: Secondary | ICD-10-CM

## 2011-01-20 DIAGNOSIS — Z7901 Long term (current) use of anticoagulants: Secondary | ICD-10-CM

## 2011-01-20 DIAGNOSIS — I4891 Unspecified atrial fibrillation: Secondary | ICD-10-CM

## 2011-01-20 DIAGNOSIS — I350 Nonrheumatic aortic (valve) stenosis: Secondary | ICD-10-CM

## 2011-01-20 DIAGNOSIS — Z954 Presence of other heart-valve replacement: Secondary | ICD-10-CM

## 2011-01-31 ENCOUNTER — Encounter: Payer: Medicare Other | Admitting: *Deleted

## 2011-02-06 ENCOUNTER — Telehealth: Payer: Self-pay | Admitting: *Deleted

## 2011-02-06 NOTE — Telephone Encounter (Signed)
Anna with [facility] called to inform that patient will let PMD check coumadin for now.

## 2011-02-07 ENCOUNTER — Ambulatory Visit: Payer: Self-pay | Admitting: *Deleted

## 2011-02-07 ENCOUNTER — Encounter: Payer: Medicare Other | Admitting: *Deleted

## 2011-02-07 DIAGNOSIS — I4891 Unspecified atrial fibrillation: Secondary | ICD-10-CM

## 2011-02-07 DIAGNOSIS — Z7901 Long term (current) use of anticoagulants: Secondary | ICD-10-CM

## 2011-02-07 DIAGNOSIS — I350 Nonrheumatic aortic (valve) stenosis: Secondary | ICD-10-CM

## 2011-02-07 DIAGNOSIS — Z954 Presence of other heart-valve replacement: Secondary | ICD-10-CM

## 2011-03-04 ENCOUNTER — Encounter: Payer: Self-pay | Admitting: Cardiovascular Disease

## 2011-03-04 ENCOUNTER — Ambulatory Visit (INDEPENDENT_AMBULATORY_CARE_PROVIDER_SITE_OTHER): Payer: Medicare Other | Admitting: Cardiovascular Disease

## 2011-03-04 DIAGNOSIS — R0602 Shortness of breath: Secondary | ICD-10-CM

## 2011-03-04 DIAGNOSIS — I350 Nonrheumatic aortic (valve) stenosis: Secondary | ICD-10-CM

## 2011-03-04 DIAGNOSIS — I359 Nonrheumatic aortic valve disorder, unspecified: Secondary | ICD-10-CM

## 2011-03-04 DIAGNOSIS — I4891 Unspecified atrial fibrillation: Secondary | ICD-10-CM

## 2011-03-04 NOTE — Assessment & Plan Note (Signed)
Status post aortic valve replacement. She seems to be doing reasonably well still has symptoms of dyspnea and dizziness which seems to be orthostatic. I will request an echocardiogram to evaluate the symptoms and it showed that the valve is working properly. She might have a degree of diastolic dysfunction as well and that would be evaluated. I recommend continuing anticoagulation until may of this year. After that warfarin can be discontinued and she can be placed on aspirin daily. She asked if she can have dental work done as well as cataract surgery. That can be done after warfarin is discontinued in May as long as her echocardiogram is unremarkable.

## 2011-03-04 NOTE — Progress Notes (Signed)
HPI  This is an 78 year old female who is here today for followup visit. She has a history of severe aortic stenosis status post aortic valve replacement with a bioprosthetic in November of 2012. She had postoperative atrial fibrillation that was treated with amiodarone and has resolved. Overall, she has been doing reasonably well. She denies any chest pain. However, she continues to complain of exertional dyspnea as well as dizziness. The dizziness is mostly when she stands up and mostly in the early morning hours. She denies any syncope or presyncope.  Allergies  Allergen Reactions  . Avelox (Moxifloxacin Hcl In Nacl) Rash    Avelox began at approximately 0800. At 0807 pt noted to have red-streaking proximal to the PIV site, up the left arm, to the neck/chest area. Avelox immediately discontinued. Pt received about 1/4 the dose or 100mg . Vital signs remained stable. The anesthesiologist and surgeon were notified and shown the site of red, rashy streaking.  Marland Kitchen Penicillins Swelling  . Sulfa Antibiotics Other (See Comments)    unknown     Current Outpatient Prescriptions on File Prior to Visit  Medication Sig Dispense Refill  . calcium-vitamin D (OSCAL WITH D) 500-200 MG-UNIT per tablet Take 1 tablet by mouth 2 (two) times daily.        . cholecalciferol (VITAMIN D) 400 UNITS TABS Take 400 Units by mouth daily.        . diphenhydrAMINE (BENADRYL) 25 mg capsule Take 25 mg by mouth at bedtime.        . fish oil-omega-3 fatty acids 1000 MG capsule Take 1 g by mouth daily.        . Multiple Vitamin (MULTIVITAMIN) tablet Take 1 tablet by mouth daily.        . sertraline (ZOLOFT) 50 MG tablet Take 50 mg by mouth daily.       . vitamin C (ASCORBIC ACID) 500 MG tablet Take 500 mg by mouth daily.        Marland Kitchen warfarin (COUMADIN) 5 MG tablet Take 1 tablet (5 mg total) by mouth daily at 6 PM. Or as directed by Dr. Jari Sportsman office  30 tablet  0  . metoprolol succinate (TOPROL XL) 25 MG 24 hr tablet Take 1 tablet  (25 mg total) by mouth daily.  30 tablet  6  . mirtazapine (REMERON) 15 MG tablet Take 15 mg by mouth at bedtime.        . ondansetron (ZOFRAN) 4 MG tablet Take 4 mg by mouth every 6 (six) hours as needed.       . traMADol (ULTRAM) 50 MG tablet Take 50-100 mg by mouth every 6 (six) hours as needed.          Past Medical History  Diagnosis Date  . Varicose veins   . Seasonal allergies   . Osteomyelitis     history of osteomyelistis in the leg  . Aortic stenosis   . Dizziness   . Hypertension   . Atrial fibrillation     Post op     Past Surgical History  Procedure Date  . Spine surgery 1967  1976    dr. Ollen Bowl  . Aortic valve replacement 11/22/2010    21mm pericardial valve serial#[id number], model [id number]     History reviewed. No pertinent family history.   History   Social History  . Marital Status: Widowed    Spouse Name: N/A    Number of Children: N/A  . Years of Education: N/A   Occupational History  .  Not on file.   Social History Main Topics  . Smoking status: Never Smoker   . Smokeless tobacco: Never Used  . Alcohol Use: No  . Drug Use: No  . Sexually Active: Not on file   Other Topics Concern  . Not on file   Social History Narrative  . No narrative on file     PHYSICAL EXAM   BP 145/88  Pulse 87  Ht 5' 6.5" (1.689 m)  Wt 143 lb (64.864 kg)  BMI 22.73 kg/m2  Constitutional: She is oriented to person, place, and time. She appears well-developed and well-nourished. No distress.  HENT: No nasal discharge.  Head: Normocephalic and atraumatic.  Eyes: Pupils are equal and round. Right eye exhibits no discharge. Left eye exhibits no discharge.  Neck: Normal range of motion. Neck supple. No JVD present. No thyromegaly present.  Cardiovascular: Normal rate, regular rhythm, normal heart sounds. Exam reveals no gallop and no friction rub. There is a 2/6 systolic ejection murmur at the aortic area which is early peaking. Pulmonary/Chest: Effort  normal and breath sounds normal. No stridor. No respiratory distress. She has no wheezes. She has no rales. She exhibits no tenderness.  Abdominal: Soft. Bowel sounds are normal. She exhibits no distension. There is no tenderness. There is no rebound and no guarding.  Musculoskeletal: Normal range of motion. She exhibits no edema and no tenderness.  Neurological: She is alert and oriented to person, place, and time. Coordination normal.  Skin: Skin is warm and dry. No rash noted. She is not diaphoretic. No erythema. No pallor.  Psychiatric: She has a normal mood and affect. Her behavior is normal. Judgment and thought content normal.      ASSESSMENT AND PLAN

## 2011-03-04 NOTE — Assessment & Plan Note (Signed)
This was only postoperative. Currently she is in normal sinus rhythm. She is actually not taking metoprolol anymore.

## 2011-03-04 NOTE — Patient Instructions (Signed)
Your physician recommends that you schedule a follow-up appointment in:6 months. You will receive a reminder letter in the mail about 1-2 months in advance reminding you to call and schedule your appointment. If you don't receive this letter, please contact our office.  Your physician has requested that you have an echocardiogram. Echocardiography is a painless test that uses sound waves to create images of your heart. It provides your doctor with information about the size and shape of your heart and how well your heart's chambers and valves are working. This procedure takes approximately one hour. There are no restrictions for this procedure.    Your physician recommends that you continue on your current medications as directed. Please refer to the Current Medication list given to you today.

## 2011-03-13 ENCOUNTER — Other Ambulatory Visit (INDEPENDENT_AMBULATORY_CARE_PROVIDER_SITE_OTHER): Payer: Medicare Other

## 2011-03-13 ENCOUNTER — Other Ambulatory Visit: Payer: Self-pay

## 2011-03-13 DIAGNOSIS — R0602 Shortness of breath: Secondary | ICD-10-CM

## 2011-03-13 DIAGNOSIS — I359 Nonrheumatic aortic valve disorder, unspecified: Secondary | ICD-10-CM

## 2011-03-20 ENCOUNTER — Telehealth: Payer: Self-pay | Admitting: *Deleted

## 2011-03-20 NOTE — Telephone Encounter (Signed)
Message copied by Arlyss Gandy on Thu Mar 20, 2011  3:52 PM ------      Message from: Lorine Bears A      Created: Thu Mar 20, 2011 12:07 PM       Inform patient that echo was fine but it did show a moderate size aortic aneurysm. This will need to be monitored. Schedule her for an abdominal vascular ultrasound in 6 months ( diagnosis: aortic aneurysm).

## 2011-03-20 NOTE — Telephone Encounter (Signed)
Left message to call back on voicemail regarding results.  

## 2011-04-08 NOTE — Telephone Encounter (Signed)
Pt notified of results and verbalized understanding  

## 2011-09-05 ENCOUNTER — Other Ambulatory Visit: Payer: Self-pay | Admitting: Cardiology

## 2011-09-05 DIAGNOSIS — I714 Abdominal aortic aneurysm, without rupture: Secondary | ICD-10-CM

## 2011-09-11 ENCOUNTER — Encounter (INDEPENDENT_AMBULATORY_CARE_PROVIDER_SITE_OTHER): Payer: Medicare Other

## 2011-09-11 DIAGNOSIS — I714 Abdominal aortic aneurysm, without rupture: Secondary | ICD-10-CM

## 2011-09-16 ENCOUNTER — Telehealth: Payer: Self-pay | Admitting: *Deleted

## 2011-09-16 DIAGNOSIS — I714 Abdominal aortic aneurysm, without rupture: Secondary | ICD-10-CM

## 2011-09-16 NOTE — Telephone Encounter (Signed)
Patient informed. 

## 2011-09-16 NOTE — Telephone Encounter (Signed)
Message copied by Eustace Moore on Tue Sep 16, 2011 11:30 AM ------      Message from: Lorine Bears A      Created: Sun Sep 14, 2011  7:31 PM       Large aortic aneurysm. Refer her to vascular surgery in [place].

## 2011-10-06 ENCOUNTER — Encounter: Payer: Self-pay | Admitting: Vascular Surgery

## 2011-10-07 ENCOUNTER — Ambulatory Visit (INDEPENDENT_AMBULATORY_CARE_PROVIDER_SITE_OTHER): Payer: Medicare Other | Admitting: Vascular Surgery

## 2011-10-07 ENCOUNTER — Encounter: Payer: Self-pay | Admitting: Vascular Surgery

## 2011-10-07 VITALS — BP 154/70 | HR 64 | Resp 16 | Ht 68.0 in | Wt 145.0 lb

## 2011-10-07 DIAGNOSIS — I714 Abdominal aortic aneurysm, without rupture: Secondary | ICD-10-CM

## 2011-10-07 NOTE — Progress Notes (Signed)
Subjective:     Patient ID: Lauren Morrow, female   DOB: September 02, 1924, 78 y.o.   MRN: [id number]  HPI this 78 year old female was referred by Dr. Kirke Corin for evaluation of abdominal aortic aneurysm. This has been followed in the past and in March of 2013 was 4.7 cm in maximum diameter. Most recent study a few weeks ago revealed enlargement to 5.38 cm in maximum diameter in the infrarenal aorta. Patient had aortic valve replacement by Dr. Donata Clay N. November 2012 and is on Coumadin for a prosthetic valve. She had a recent echocardiogram revealing ejection fraction of 55-60%. She denies active chest pain but does have dyspnea on exertion.  Past Medical History  Diagnosis Date  . Varicose veins   . Seasonal allergies   . Osteomyelitis     history of osteomyelistis in the leg  . Aortic stenosis   . Dizziness   . Hypertension   . Atrial fibrillation     Post op  . Peripheral vascular disease     History  Substance Use Topics  . Smoking status: Never Smoker   . Smokeless tobacco: Never Used  . Alcohol Use: No    Family History  Problem Relation Age of Onset  . Other Mother     Varicose Veins  . Cancer Sister   . Hyperlipidemia Brother   . Other Brother     Allergies  Allergen Reactions  . Avelox (Moxifloxacin Hcl In Nacl) Rash    Avelox began at approximately 0800. At 0807 pt noted to have red-streaking proximal to the PIV site, up the left arm, to the neck/chest area. Avelox immediately discontinued. Pt received about 1/4 the dose or 100mg . Vital signs remained stable. The anesthesiologist and surgeon were notified and shown the site of red, rashy streaking.  Marland Kitchen Penicillins Swelling  . Sulfa Antibiotics Other (See Comments)    unknown    Current outpatient prescriptions:calcium-vitamin D (OSCAL WITH D) 500-200 MG-UNIT per tablet, Take 1 tablet by mouth 2 (two) times daily.  , Disp: , Rfl: ;  cholecalciferol (VITAMIN D) 400 UNITS TABS, Take 400 Units by mouth daily.  , Disp: ,  Rfl: ;  fish oil-omega-3 fatty acids 1000 MG capsule, Take 1 g by mouth daily.  , Disp: , Rfl: ;  meclizine (ANTIVERT) 25 MG tablet, Take 25 mg by mouth as needed., Disp: , Rfl:  metoprolol succinate (TOPROL-XL) 25 MG 24 hr tablet, Take 12.5 mg by mouth daily., Disp: , Rfl: ;  Multiple Vitamin (MULTIVITAMIN) tablet, Take 1 tablet by mouth daily.  , Disp: , Rfl: ;  omeprazole (PRILOSEC) 20 MG capsule, Take 20 mg by mouth daily., Disp: , Rfl: ;  sertraline (ZOLOFT) 50 MG tablet, Take 50 mg by mouth daily. , Disp: , Rfl: ;  vitamin C (ASCORBIC ACID) 500 MG tablet, Take 500 mg by mouth daily.  , Disp: , Rfl:  warfarin (COUMADIN) 5 MG tablet, Take 1 tablet (5 mg total) by mouth daily at 6 PM. Or as directed by Dr. Jari Sportsman office, Disp: 30 tablet, Rfl: 0;  DISCONTD: metoprolol succinate (TOPROL XL) 25 MG 24 hr tablet, Take 1 tablet (25 mg total) by mouth daily., Disp: 30 tablet, Rfl: 6;  diphenhydrAMINE (BENADRYL) 12.5 MG/5ML elixir, Take 10 mLs (25 mg total) by mouth at bedtime as needed for sleep., Disp: 120 mL, Rfl: 0 diphenhydrAMINE (BENADRYL) 25 mg capsule, Take 25 mg by mouth at bedtime.  , Disp: , Rfl: ;  mirtazapine (REMERON) 15 MG tablet,  Take 15 mg by mouth at bedtime.  , Disp: , Rfl: ;  ondansetron (ZOFRAN) 4 MG tablet, Take 4 mg by mouth every 6 (six) hours as needed. , Disp: , Rfl: ;  traMADol (ULTRAM) 50 MG tablet, Take 50-100 mg by mouth every 6 (six) hours as needed. , Disp: , Rfl:   BP 154/70  Pulse 64  Resp 16  Ht 5\' 8"  (1.727 m)  Wt 145 lb (65.772 kg)  BMI 22.05 kg/m2  SpO2 94%  Body mass index is 22.05 kg/(m^2).           Review of Systems denies chest pain, hemoptysis, claudication, lateralizing weakness, aphasia, amaurosis fugax, diplopia, blurred vision, and syncope. Does have occasional orthopnea, dyspnea on exertion, mild chest discomfort, varicose veins, weakness in the arms legs, dizziness, and chills. Other systems negative and complete review of systems       Objective:   Physical Exam blood pressure 104/70 heart rate 64 respirations 16 Gen.-alert and oriented x3 in no apparent distress HEENT normal for age Lungs no rhonchi or wheezing Cardiovascular regular rhythm no murmurs carotid pulses 3+ palpable no bruits audible Abdomen soft nontender -5 cm pulsatile mass palpable Musculoskeletal free of  major deformities Skin clear -no rashes Neurologic normal Lower extremities 3+ femoral and popliteal pulses palpable. No pedal pulses palpable. Both feet well perfused. Evidence of chronic venous insufficiency left leg with some hyperpigmentation and thickening of skin medially.  Recent ultrasound performed at [facility] heart care revealed aneurysm to be 5.38 x 5.2 cm in maximum diameter      Assessment:     #1 enlarging infrarenal abdominal aortic aneurysm-5.38 cm presently #2 history of mitral valve replacement December 2012 on chronic Coumadin for prosthetic valve    Plan:     Discussed situation with Dr. Benard Rink. We'll obtain number-one nuclear medicine scan to determine cardiac risk and also obtain a CT angiogram of abdomen and pelvis to see if patient stent graft candidate Patient will return in 3 weeks to discuss these findings

## 2011-10-09 ENCOUNTER — Encounter: Payer: Self-pay | Admitting: Cardiovascular Disease

## 2011-10-09 ENCOUNTER — Ambulatory Visit (INDEPENDENT_AMBULATORY_CARE_PROVIDER_SITE_OTHER): Payer: Medicare Other | Admitting: Cardiovascular Disease

## 2011-10-09 VITALS — BP 110/64 | HR 64 | Ht 68.0 in | Wt 145.8 lb

## 2011-10-09 DIAGNOSIS — Z954 Presence of other heart-valve replacement: Secondary | ICD-10-CM

## 2011-10-09 DIAGNOSIS — Z952 Presence of prosthetic heart valve: Secondary | ICD-10-CM

## 2011-10-09 DIAGNOSIS — I4891 Unspecified atrial fibrillation: Secondary | ICD-10-CM

## 2011-10-09 DIAGNOSIS — Z01810 Encounter for preprocedural cardiovascular examination: Secondary | ICD-10-CM

## 2011-10-09 MED ORDER — ASPIRIN EC 325 MG PO TBEC: 325.0000 mg | DELAYED_RELEASE_TABLET | Freq: Every day | ORAL | Status: DC

## 2011-10-09 NOTE — Patient Instructions (Addendum)
Stop taking Warfarin.  Increase Aspirin to 325 mg once daily.  Follow up in 6 months.

## 2011-10-10 ENCOUNTER — Encounter: Payer: Self-pay | Admitting: Cardiovascular Disease

## 2011-10-10 DIAGNOSIS — Z952 Presence of prosthetic heart valve: Secondary | ICD-10-CM | POA: Insufficient documentation

## 2011-10-10 DIAGNOSIS — Z01810 Encounter for preprocedural cardiovascular examination: Secondary | ICD-10-CM | POA: Insufficient documentation

## 2011-10-10 NOTE — Assessment & Plan Note (Signed)
She seems to be doing reasonably well from a cardiac standpoint. She has no symptoms suggestive of angina or arrhythmia. She had an echocardiogram done this year which showed normal LV systolic function with normal functioning prosthesis. Given that she had a bioprosthetic valve and not a mechanical valve, anticoagulation is usually recommended for only 6 months. Thus, I recommend stopping warfarin and increasing aspirin to 325 mg once daily.

## 2011-10-10 NOTE — Assessment & Plan Note (Signed)
Cardiac catheterization before her aortic valve replacement last year showed no evidence of coronary artery disease and no significant pulmonary hypertension. Thus, no further ischemic evaluation is needed before the surgery. Obviously, due to her age and frail status, I don't think she is a candidate for open repair of aortic aneurysm. Hopefully, she will be a candidate for a stent graft.

## 2011-10-10 NOTE — Assessment & Plan Note (Signed)
This was only postoperative with no recurrent arrhythmia. Thus, I don't feel that this requires anticoagulation.

## 2011-10-10 NOTE — Progress Notes (Signed)
HPI  This is an 78 year old female who is here today for followup visit. She has a history of severe aortic stenosis status post aortic valve replacement with a bioprosthetic in November of 2012. She had postoperative atrial fibrillation that was treated with amiodarone and has resolved. Overall, she has been doing reasonably well. She denies any chest pain. She is known to have down aortic aneurysm which progressed in size to 5.4 cm. I referred her to Dr. Hart Rochester. She is going to have a CTA done to determine if she would be a candidate for a stent graft. Overall, she has been doing reasonably well. She denies any chest pain. She still complains of dyspnea. Overall functional capacity is reasonable. She walks slowly with a cane.   Allergies  Allergen Reactions  . Avelox (Moxifloxacin Hcl In Nacl) Rash    Avelox began at approximately 0800. At 0807 pt noted to have red-streaking proximal to the PIV site, up the left arm, to the neck/chest area. Avelox immediately discontinued. Pt received about 1/4 the dose or 100mg . Vital signs remained stable. The anesthesiologist and surgeon were notified and shown the site of red, rashy streaking.  Marland Kitchen Penicillins Swelling  . Sulfa Antibiotics Other (See Comments)    unknown     Current Outpatient Prescriptions on File Prior to Visit  Medication Sig Dispense Refill  . calcium-vitamin D (OSCAL WITH D) 500-200 MG-UNIT per tablet Take 1 tablet by mouth 2 (two) times daily.        . diphenhydrAMINE (BENADRYL) 25 mg capsule Take 25 mg by mouth at bedtime.        . fish oil-omega-3 fatty acids 1000 MG capsule Take 1 g by mouth daily.        . meclizine (ANTIVERT) 25 MG tablet Take 25 mg by mouth as needed.      . Multiple Vitamin (MULTIVITAMIN) tablet Take 1 tablet by mouth daily.        Marland Kitchen omeprazole (PRILOSEC) 20 MG capsule Take 20 mg by mouth daily.      . sertraline (ZOLOFT) 50 MG tablet Take 50 mg by mouth daily.       . vitamin C (ASCORBIC ACID) 500 MG tablet  Take 500 mg by mouth daily.        . diphenhydrAMINE (BENADRYL) 12.5 MG/5ML elixir Take 10 mLs (25 mg total) by mouth at bedtime as needed for sleep.  120 mL  0     Past Medical History  Diagnosis Date  . Varicose veins   . Seasonal allergies   . Osteomyelitis     history of osteomyelistis in the leg  . Aortic stenosis   . Dizziness   . Hypertension   . Atrial fibrillation     Post op  . Peripheral vascular disease      Past Surgical History  Procedure Date  . Spine surgery 1967  1976    dr. Ollen Bowl  . Aortic valve replacement 11/22/2010    21mm pericardial valve serial#[id number], model 3300tfx  . Cardiac catheterization   . Aortic valve replacement     In 2012 with a bioprosthetic valve     Family History  Problem Relation Age of Onset  . Other Mother     Varicose Veins  . Cancer Sister   . Hyperlipidemia Brother   . Other Brother      History   Social History  . Marital Status: Widowed    Spouse Name: N/A    Number of Children: N/A  .  Years of Education: N/A   Occupational History  . Not on file.   Social History Main Topics  . Smoking status: Never Smoker   . Smokeless tobacco: Never Used  . Alcohol Use: No  . Drug Use: No  . Sexually Active: Not on file   Other Topics Concern  . Not on file   Social History Narrative  . No narrative on file     PHYSICAL EXAM   BP 110/64  Pulse 64  Ht 5\' 8"  (1.727 m)  Wt 145 lb 12.8 oz (66.134 kg)  BMI 22.17 kg/m2  Constitutional: She is oriented to person, place, and time. She appears well-developed and well-nourished. No distress.  HENT: No nasal discharge.  Head: Normocephalic and atraumatic.  Eyes: Pupils are equal and round. Right eye exhibits no discharge. Left eye exhibits no discharge.  Neck: Normal range of motion. Neck supple. No JVD present. No thyromegaly present.  Cardiovascular: Normal rate, regular rhythm, normal heart sounds. Exam reveals no gallop and no friction rub. There is a 1/6  systolic ejection murmur at the aortic area which is early peaking. Pulmonary/Chest: Effort normal and breath sounds normal. No stridor. No respiratory distress. She has no wheezes. She has no rales. She exhibits no tenderness.  Abdominal: Soft. Bowel sounds are normal. She exhibits no distension. There is no tenderness. There is no rebound and no guarding.  Musculoskeletal: Normal range of motion. She exhibits no edema and no tenderness.  Neurological: She is alert and oriented to person, place, and time. Coordination normal.  Skin: Skin is warm and dry. No rash noted. She is not diaphoretic. No erythema. No pallor.  Psychiatric: She has a normal mood and affect. Her behavior is normal. Judgment and thought content normal.    EKG: Normal sinus rhythm.  ASSESSMENT AND PLAN

## 2011-10-23 ENCOUNTER — Other Ambulatory Visit: Payer: Self-pay | Admitting: Vascular Surgery

## 2011-10-23 LAB — BUN: BUN: 15 mg/dL (ref 6–23)

## 2011-10-27 ENCOUNTER — Encounter: Payer: Self-pay | Admitting: Vascular Surgery

## 2011-10-28 ENCOUNTER — Ambulatory Visit (INDEPENDENT_AMBULATORY_CARE_PROVIDER_SITE_OTHER): Payer: Medicare Other | Admitting: Vascular Surgery

## 2011-10-28 ENCOUNTER — Ambulatory Visit
Admission: RE | Admit: 2011-10-28 | Discharge: 2011-10-28 | Disposition: A | Discharge status: Home / Self Care | Payer: Medicare Other | Source: Ambulatory Visit | Attending: Vascular Surgery | Admitting: Vascular Surgery

## 2011-10-28 ENCOUNTER — Telehealth: Payer: Self-pay | Admitting: Cardiovascular Disease

## 2011-10-28 ENCOUNTER — Encounter: Payer: Self-pay | Admitting: Vascular Surgery

## 2011-10-28 VITALS — BP 150/79 | HR 73 | Resp 20 | Ht 68.0 in | Wt 146.4 lb

## 2011-10-28 DIAGNOSIS — I714 Abdominal aortic aneurysm, without rupture, unspecified: Secondary | ICD-10-CM

## 2011-10-28 MED ORDER — IOHEXOL 350 MG/ML SOLN
80.0000 mL | Freq: Once | INTRAVENOUS | Status: AC | PRN
  Administered 2011-10-28: 80 mL via INTRAVENOUS

## 2011-10-28 NOTE — Telephone Encounter (Signed)
Message copied by Megan Salon on Tue Oct 28, 2011 11:21 AM ------      Message from: Lorine Bears A      Created: Tue Oct 28, 2011 10:50 AM      Regarding: appointment       Please add her to my schedule on Thursday. I need to discuss with her the aneurysm surgery.

## 2011-10-28 NOTE — Progress Notes (Signed)
Subjective:     Patient ID: Lauren Morrow, female   DOB: [birth year], 78 y.o.   MRN: [id number]  HPI this 78 year old female returns today to discuss treatment options for her abdominal aortic aneurysm. She had a CT angiogram performed earlier today which I have reviewed by computer. It was hoped that she would be a candidate for aortic stent grafting but there is no significantinfra renal neck. Patient is not a candidate for aortic stent grafting. Patient did have aortic valve replacement performed 12 months ago and was slow to recover. She does ambulate on a daily basis however does not have severe COPD. She has good ventricular function and her valve is functioning well.   Past Medical History  Diagnosis Date  . Varicose veins   . Seasonal allergies   . Osteomyelitis     history of osteomyelistis in the leg  . Aortic stenosis   . Dizziness   . Hypertension   . Atrial fibrillation     Post op  . Peripheral vascular disease     History  Substance Use Topics  . Smoking status: Never Smoker   . Smokeless tobacco: Never Used  . Alcohol Use: No    Family History  Problem Relation Age of Onset  . Other Mother     Varicose Veins  . Cancer Sister   . Hyperlipidemia Brother   . Other Brother     Allergies  Allergen Reactions  . Avelox (Moxifloxacin Hcl In Nacl) Rash    Avelox began at approximately 0800. At 0807 pt noted to have red-streaking proximal to the PIV site, up the left arm, to the neck/chest area. Avelox immediately discontinued. Pt received about 1/4 the dose or 100mg . Vital signs remained stable. The anesthesiologist and surgeon were notified and shown the site of red, rashy streaking.  Marland Kitchen Penicillins Swelling  . Sulfa Antibiotics Other (See Comments)    unknown    Current outpatient prescriptions:aspirin EC 325 MG tablet, Take 1 tablet (325 mg total) by mouth daily., Disp: 30 tablet, Rfl: 0;  b complex vitamins tablet, Take 1 tablet by mouth daily., Disp: , Rfl: ;   calcium-vitamin D (OSCAL WITH D) 500-200 MG-UNIT per tablet, Take 1 tablet by mouth 2 (two) times daily.  , Disp: , Rfl: ;  Cholecalciferol (VITAMIN D) 1000 UNITS capsule, Take 1,000 Units by mouth daily., Disp: , Rfl:  diphenhydrAMINE (BENADRYL) 25 mg capsule, Take 25 mg by mouth at bedtime.  , Disp: , Rfl: ;  fish oil-omega-3 fatty acids 1000 MG capsule, Take 1 g by mouth daily.  , Disp: , Rfl: ;  meclizine (ANTIVERT) 25 MG tablet, Take 25 mg by mouth as needed., Disp: , Rfl: ;  Multiple Vitamin (MULTIVITAMIN) tablet, Take 1 tablet by mouth daily.  , Disp: , Rfl: ;  omeprazole (PRILOSEC) 20 MG capsule, Take 20 mg by mouth daily., Disp: , Rfl:  sertraline (ZOLOFT) 50 MG tablet, Take 50 mg by mouth daily. , Disp: , Rfl: ;  vitamin C (ASCORBIC ACID) 500 MG tablet, Take 500 mg by mouth daily.  , Disp: , Rfl: ;  diphenhydrAMINE (BENADRYL) 12.5 MG/5ML elixir, Take 10 mLs (25 mg total) by mouth at bedtime as needed for sleep., Disp: 120 mL, Rfl: 0 No current facility-administered medications for this visit. Facility-Administered Medications Ordered in Other Visits: iohexol (OMNIPAQUE) 350 MG/ML injection 80 mL, 80 mL, Intravenous, Once PRN, Medication Radiologist, MD, 80 mL at 10/28/11 0904  BP 150/79  Pulse 73  Resp  20  Ht 5\' 8"  (1.727 m)  Wt 146 lb 6.4 oz (66.407 kg)  BMI 22.26 kg/m2  Body mass index is 22.26 kg/(m^2).          Review of Systems     Objective:   Physical Exam blood pressure 150/79 heart rate 73 respirations 20 Gen.-alert and oriented x3 in no apparent distress HEENT normal for age Lungs no rhonchi or wheezing Cardiovascular regular rhythm no murmurs carotid pulses 3+ palpable no bruits audible-crisp aortic valve sounds  Abdomen soft nontender no palpable masses Musculoskeletal free of  major deformities Skin clear -no rashes Neurologic normal Lower extremities 3+ femoral and popliteal pulses palpable bilaterally with no edema       Assessment:     I discussed  the situation at length with the patient and her family today as well as Dr. Kirke Corin Patient has an aneurysm which is enlarged approximately 0.6 cm in the past 6-8 months and currently measures about 5.4 cm in maximum diameter I discussed mortality and morbidity of an open aneurysm resection with the patient and gave the option of following this aneurysm versus open repair.    Plan:     Patient and family will meet with Dr.Aridaon Thursday of this week to further discuss this option of surgery versus continued observation. I will be in touch with [place] regarding their decision

## 2011-10-28 NOTE — Telephone Encounter (Signed)
Need to notify patient of her appointment on Thursday 10-30-2011 with Dr. Kirke Corin per Dr. Kirke Corin.

## 2011-10-30 ENCOUNTER — Encounter: Payer: Self-pay | Admitting: Cardiovascular Disease

## 2011-10-30 ENCOUNTER — Ambulatory Visit (INDEPENDENT_AMBULATORY_CARE_PROVIDER_SITE_OTHER): Payer: Medicare Other | Admitting: Cardiovascular Disease

## 2011-10-30 VITALS — BP 161/76 | HR 73 | Ht 68.0 in | Wt 146.4 lb

## 2011-10-30 DIAGNOSIS — Z01810 Encounter for preprocedural cardiovascular examination: Secondary | ICD-10-CM

## 2011-10-30 DIAGNOSIS — I10 Essential (primary) hypertension: Secondary | ICD-10-CM

## 2011-10-30 DIAGNOSIS — Z954 Presence of other heart-valve replacement: Secondary | ICD-10-CM

## 2011-10-30 DIAGNOSIS — Z952 Presence of prosthetic heart valve: Secondary | ICD-10-CM

## 2011-10-30 NOTE — Patient Instructions (Addendum)
Continue same medications.  Follow up in 3 months.  

## 2011-10-31 ENCOUNTER — Encounter: Payer: Self-pay | Admitting: Cardiovascular Disease

## 2011-10-31 NOTE — Assessment & Plan Note (Signed)
Her blood pressure is elevated today. However, during last visit, her blood pressure was actually low. Thus, I will not add a medication at this time. If her blood pressure continues to be elevated, I will consider treatment with carvedilol.

## 2011-10-31 NOTE — Assessment & Plan Note (Signed)
She is stable   

## 2011-10-31 NOTE — Progress Notes (Signed)
HPI  This is an 78 year old female who is here today for followup visit. She has a history of severe aortic stenosis status post aortic valve replacement with a bioprosthetic in November of 2012. She had postoperative atrial fibrillation that was treated with amiodarone and has resolved. Overall, she has been doing reasonably well. She denies any chest pain.  She was seen by Dr. Hart Rochester for abdominal aortic aneurysm which has progressed quickly to 5.4 cm in diameter. She's also complaining of increased abdominal fullness. She was evaluated with a CTA and was determined not to be a good candidate for stent graft. The options include surgical repair versus conservative management.  Overall, she has been doing reasonably well. She denies any chest pain. Her dyspnea is better. Overall functional capacity is good.   Allergies  Allergen Reactions  . Avelox (Moxifloxacin Hcl In Nacl) Rash    Avelox began at approximately 0800. At 0807 pt noted to have red-streaking proximal to the PIV site, up the left arm, to the neck/chest area. Avelox immediately discontinued. Pt received about 1/4 the dose or 100mg . Vital signs remained stable. The anesthesiologist and surgeon were notified and shown the site of red, rashy streaking.  Marland Kitchen Penicillins Swelling  . Sulfa Antibiotics Other (See Comments)    unknown     Current Outpatient Prescriptions on File Prior to Visit  Medication Sig Dispense Refill  . aspirin EC 325 MG tablet Take 1 tablet (325 mg total) by mouth daily.  30 tablet  0  . b complex vitamins tablet Take 1 tablet by mouth daily.      . calcium-vitamin D (OSCAL WITH D) 500-200 MG-UNIT per tablet Take 1 tablet by mouth 2 (two) times daily.        . Cholecalciferol (VITAMIN D) 1000 UNITS capsule Take 1,000 Units by mouth daily.      . fish oil-omega-3 fatty acids 1000 MG capsule Take 1 g by mouth daily.        . meclizine (ANTIVERT) 25 MG tablet Take 25 mg by mouth as needed.      . Multiple Vitamin  (MULTIVITAMIN) tablet Take 1 tablet by mouth daily.        Marland Kitchen omeprazole (PRILOSEC) 20 MG capsule Take 20 mg by mouth daily.      . sertraline (ZOLOFT) 50 MG tablet Take 50 mg by mouth daily.       . vitamin C (ASCORBIC ACID) 500 MG tablet Take 500 mg by mouth daily.        . diphenhydrAMINE (BENADRYL) 12.5 MG/5ML elixir Take 10 mLs (25 mg total) by mouth at bedtime as needed for sleep.  120 mL  0     Past Medical History  Diagnosis Date  . Varicose veins   . Seasonal allergies   . Osteomyelitis     history of osteomyelistis in the leg  . Aortic stenosis   . Dizziness   . Hypertension   . Atrial fibrillation     Post op  . Peripheral vascular disease      Past Surgical History  Procedure Date  . Spine surgery 1967  1976    dr. Ollen Bowl  . Aortic valve replacement 11/22/2010    21mm pericardial valve serial#[id number], model 3300tfx  . Cardiac catheterization   . Aortic valve replacement     In 2012 with a bioprosthetic valve     Family History  Problem Relation Age of Onset  . Other Mother     Varicose Veins  .  Cancer Sister   . Hyperlipidemia Brother   . Other Brother      History   Social History  . Marital Status: Widowed    Spouse Name: N/A    Number of Children: N/A  . Years of Education: N/A   Occupational History  . Not on file.   Social History Main Topics  . Smoking status: Never Smoker   . Smokeless tobacco: Never Used  . Alcohol Use: No  . Drug Use: No  . Sexually Active: Not on file   Other Topics Concern  . Not on file   Social History Narrative  . No narrative on file     PHYSICAL EXAM   BP 161/76  Pulse 73  Ht 5\' 8"  (1.727 m)  Wt 146 lb 6.4 oz (66.407 kg)  BMI 22.26 kg/m2  Constitutional: She is oriented to person, place, and time. She appears well-developed and well-nourished. No distress.  HENT: No nasal discharge.  Head: Normocephalic and atraumatic.  Eyes: Pupils are equal and round. Right eye exhibits no discharge.  Left eye exhibits no discharge.  Neck: Normal range of motion. Neck supple. No JVD present. No thyromegaly present.  Cardiovascular: Normal rate, regular rhythm, normal heart sounds. Exam reveals no gallop and no friction rub. There is a 1/6 systolic ejection murmur at the aortic area which is early peaking. Pulmonary/Chest: Effort normal and breath sounds normal. No stridor. No respiratory distress. She has no wheezes. She has no rales. She exhibits no tenderness.  Abdominal: Soft. Bowel sounds are normal. She exhibits no distension. There is no tenderness. There is no rebound and no guarding.  Musculoskeletal: Normal range of motion. She exhibits no edema and no tenderness.  Neurological: She is alert and oriented to person, place, and time. Coordination normal.  Skin: Skin is warm and dry. No rash noted. She is not diaphoretic. No erythema. No pallor.  Psychiatric: She has a normal mood and affect. Her behavior is normal. Judgment and thought content normal.     ASSESSMENT AND PLAN

## 2011-10-31 NOTE — Assessment & Plan Note (Signed)
Cardiac catheterization before her aortic valve replacement last year showed no evidence of coronary artery disease and no significant pulmonary hypertension. Thus, no further ischemic evaluation is needed before the surgery. Echocardiogram after the surgery showed normal LV systolic function and normal functioning bioprosthetic aortic valve. Thus, there is no absolute contraindication for the planned surgery from a cardiac standpoint. However, due to her age And the nature of the surgery, she would be considered at moderate risk for cardiovascular complications. The more concerning thing would be possible prolonged recovery time. All these issues were discussed extensively today with the patient and her sister. I think they are now well informed of risks and benefits of open surgical repair of her aneurysm. They also understand the alternative. The patient most likely wants to go through the surgery but she wants to wait to take care of her other sister who fell recently. She is going to contact Dr. Hart Rochester directly and inform him of her final decision.

## 2011-11-11 ENCOUNTER — Encounter: Payer: Self-pay | Admitting: *Deleted

## 2011-11-11 ENCOUNTER — Other Ambulatory Visit: Payer: Self-pay | Admitting: *Deleted

## 2011-11-14 ENCOUNTER — Encounter (HOSPITAL_COMMUNITY)
Admission: RE | Admit: 2011-11-14 | Discharge: 2011-11-14 | Disposition: A | Discharge status: Home / Self Care | Payer: Medicare Other | Source: Ambulatory Visit | Attending: Vascular Surgery | Admitting: Vascular Surgery

## 2011-11-14 ENCOUNTER — Encounter (HOSPITAL_COMMUNITY): Payer: Self-pay

## 2011-11-14 HISTORY — DX: Low back pain: M54.5

## 2011-11-14 HISTORY — DX: Low back pain, unspecified: M54.50

## 2011-11-14 HISTORY — DX: Unspecified cataract: H26.9

## 2011-11-14 HISTORY — DX: Gastro-esophageal reflux disease without esophagitis: K21.9

## 2011-11-14 HISTORY — DX: Unspecified osteoarthritis, unspecified site: M19.90

## 2011-11-14 HISTORY — DX: Anemia, unspecified: D64.9

## 2011-11-14 HISTORY — DX: Urinary tract infection, site not specified: N39.0

## 2011-11-14 HISTORY — DX: Anxiety disorder, unspecified: F41.9

## 2011-11-14 HISTORY — DX: Shortness of breath: R06.02

## 2011-11-14 LAB — COMPREHENSIVE METABOLIC PANEL
Alkaline Phosphatase: 72 U/L (ref 39–117)
BUN: 17 mg/dL (ref 6–23)
Calcium: 10.2 mg/dL (ref 8.4–10.5)
GFR calc Af Amer: >90 mL/min (ref >90)
Glucose, Bld: 97 mg/dL (ref 70–99)
Total Protein: 7.4 g/dL (ref 6.0–8.3)

## 2011-11-14 LAB — URINALYSIS, ROUTINE W REFLEX MICROSCOPIC
Bilirubin Urine: NEGATIVE
Ketones, ur: NEGATIVE mg/dL
Nitrite: POSITIVE — AB
Protein, ur: NEGATIVE mg/dL
pH: 7.5 (ref 5.0–8.0)

## 2011-11-14 LAB — CBC
HCT: 44.6 % (ref 36.0–46.0)
Hemoglobin: 15 g/dL (ref 12.0–15.0)
MCH: 30.7 pg (ref 26.0–34.0)
MCHC: 33.6 g/dL (ref 30.0–36.0)
MCV: 91.2 fL (ref 78.0–100.0)

## 2011-11-14 LAB — BLOOD GAS, ARTERIAL
Bicarbonate: 26.6 mEq/L — ABNORMAL HIGH (ref 20.0–24.0)
O2 Saturation: 94 %
Patient temperature: 98.6
TCO2: 27.9 mmol/L (ref 0–100)

## 2011-11-14 LAB — PROTIME-INR: Prothrombin Time: 13.2 seconds (ref 11.6–15.2)

## 2011-11-14 LAB — URINE MICROSCOPIC-ADD ON

## 2011-11-14 LAB — PREPARE RBC (CROSSMATCH)

## 2011-11-14 NOTE — Pre-Procedure Instructions (Signed)
22 Lauren Morrow  11/14/2011   Your procedure is scheduled on:  Friday November 21, 2011  Report to [facility] at 5:30 AM.  Call this number if you have problems the morning of surgery: [phone]   Remember:   Do not eat food or drink :After Midnight.      Take these medicines the morning of surgery with A SIP OF WATER: omeprazole, zoloft   Do not wear jewelry, make-up or nail polish.  Do not wear lotions, powders, or perfumes.   Do not shave 48 hours prior to surgery. Men may shave face and neck.  Do not bring valuables to the hospital.  Contacts, dentures or bridgework may not be worn into surgery.  Leave suitcase in the car. After surgery it may be brought to your room.  For patients admitted to the hospital, checkout time is 11:00 AM the day of discharge.   Patients discharged the day of surgery will not be allowed to drive home.  Name and phone number of your driver: family / friend  Special Instructions: Shower using CHG 2 nights before surgery and the night before surgery.  If you shower the day of surgery use CHG.  Use special wash - you have one bottle of CHG for all showers.  You should use approximately 1/3 of the bottle for each shower.   Please read over the following fact sheets that you were given: Pain Booklet, Coughing and Deep Breathing, Blood Transfusion Information, MRSA Information and Surgical Site Infection Prevention

## 2011-11-14 NOTE — Progress Notes (Signed)
Forwarded chart to anesthesia to review cardiac records.  

## 2011-11-16 LAB — URINE CULTURE

## 2011-11-18 NOTE — Consult Note (Signed)
Anesthesiology Chart Review:  78 Y/O female for AAA repair on 11/21/11 by Dr. Hart Rochester . Patient is S/P AVR 11/22/10 with bioprosthetic valve. Post-op course complicated by a fib. Pre-op cath was (-) for CAD, NL LV function on ECHO. Felt to be an acceptable risk for planned surgery by Dr. Kirke Corin cardiology). Will evaluate patient Ms. Lauren Morrow on the day of surgery.  Kipp Brood, MD

## 2011-11-20 MED ORDER — VANCOMYCIN HCL IN DEXTROSE 1-5 GM/200ML-% IV SOLN
1000.0000 mg | INTRAVENOUS | Status: AC
  Administered 2011-11-21: 1000 mg via INTRAVENOUS
  Filled 2011-11-20: qty 200

## 2011-11-21 ENCOUNTER — Inpatient Hospital Stay (HOSPITAL_COMMUNITY)
Admission: RE | Admit: 2011-11-21 | Discharge: 2011-12-02 | DRG: 237 | Disposition: A | Discharge status: Skilled Nursing Facility | Payer: Medicare Other | Source: Ambulatory Visit | Attending: Vascular Surgery | Admitting: Vascular Surgery

## 2011-11-21 ENCOUNTER — Inpatient Hospital Stay (HOSPITAL_COMMUNITY): Payer: Medicare Other | Admitting: Anesthesiology

## 2011-11-21 ENCOUNTER — Inpatient Hospital Stay (HOSPITAL_COMMUNITY): Payer: Medicare Other

## 2011-11-21 ENCOUNTER — Encounter (HOSPITAL_COMMUNITY): Payer: Self-pay | Admitting: Anesthesiology

## 2011-11-21 ENCOUNTER — Encounter (HOSPITAL_COMMUNITY)
Admission: RE | Disposition: A | Discharge status: Skilled Nursing Facility | Payer: Self-pay | Source: Ambulatory Visit | Attending: Vascular Surgery

## 2011-11-21 DIAGNOSIS — R0603 Acute respiratory distress: Secondary | ICD-10-CM

## 2011-11-21 DIAGNOSIS — G9340 Encephalopathy, unspecified: Secondary | ICD-10-CM

## 2011-11-21 DIAGNOSIS — R197 Diarrhea, unspecified: Secondary | ICD-10-CM

## 2011-11-21 DIAGNOSIS — I10 Essential (primary) hypertension: Secondary | ICD-10-CM

## 2011-11-21 DIAGNOSIS — I714 Abdominal aortic aneurysm, without rupture, unspecified: Principal | ICD-10-CM | POA: Diagnosis present

## 2011-11-21 DIAGNOSIS — E8770 Fluid overload, unspecified: Secondary | ICD-10-CM

## 2011-11-21 DIAGNOSIS — I350 Nonrheumatic aortic (valve) stenosis: Secondary | ICD-10-CM

## 2011-11-21 DIAGNOSIS — D62 Acute posthemorrhagic anemia: Secondary | ICD-10-CM

## 2011-11-21 DIAGNOSIS — Z01812 Encounter for preprocedural laboratory examination: Secondary | ICD-10-CM

## 2011-11-21 DIAGNOSIS — K560 Paralytic ileus: Secondary | ICD-10-CM | POA: Diagnosis not present

## 2011-11-21 DIAGNOSIS — J9600 Acute respiratory failure, unspecified whether with hypoxia or hypercapnia: Secondary | ICD-10-CM | POA: Diagnosis not present

## 2011-11-21 DIAGNOSIS — I711 Thoracic aortic aneurysm, ruptured, unspecified: Secondary | ICD-10-CM

## 2011-11-21 DIAGNOSIS — D6959 Other secondary thrombocytopenia: Secondary | ICD-10-CM

## 2011-11-21 DIAGNOSIS — N390 Urinary tract infection, site not specified: Secondary | ICD-10-CM | POA: Diagnosis not present

## 2011-11-21 DIAGNOSIS — J95821 Acute postprocedural respiratory failure: Secondary | ICD-10-CM | POA: Diagnosis present

## 2011-11-21 DIAGNOSIS — I498 Other specified cardiac arrhythmias: Secondary | ICD-10-CM | POA: Diagnosis present

## 2011-11-21 DIAGNOSIS — IMO0002 Reserved for concepts with insufficient information to code with codable children: Secondary | ICD-10-CM | POA: Diagnosis present

## 2011-11-21 DIAGNOSIS — Y838 Other surgical procedures as the cause of abnormal reaction of the patient, or of later complication, without mention of misadventure at the time of the procedure: Secondary | ICD-10-CM

## 2011-11-21 DIAGNOSIS — Z8679 Personal history of other diseases of the circulatory system: Secondary | ICD-10-CM

## 2011-11-21 DIAGNOSIS — E876 Hypokalemia: Secondary | ICD-10-CM

## 2011-11-21 DIAGNOSIS — Z952 Presence of prosthetic heart valve: Secondary | ICD-10-CM

## 2011-11-21 DIAGNOSIS — D696 Thrombocytopenia, unspecified: Secondary | ICD-10-CM | POA: Diagnosis not present

## 2011-11-21 DIAGNOSIS — I959 Hypotension, unspecified: Secondary | ICD-10-CM | POA: Diagnosis not present

## 2011-11-21 DIAGNOSIS — E46 Unspecified protein-calorie malnutrition: Secondary | ICD-10-CM | POA: Diagnosis present

## 2011-11-21 HISTORY — PX: ABDOMINAL AORTIC ANEURYSM REPAIR: SHX42

## 2011-11-21 HISTORY — PX: LAPAROTOMY: SHX154

## 2011-11-21 HISTORY — PX: PATCH ANGIOPLASTY: SHX6230

## 2011-11-21 LAB — BASIC METABOLIC PANEL
BUN: 12 mg/dL (ref 6–23)
BUN: 12 mg/dL (ref 6–23)
CO2: 23 mEq/L (ref 19–32)
CO2: 22 mEq/L (ref 19–32)
Calcium: 7 mg/dL — ABNORMAL LOW (ref 8.4–10.5)
Chloride: 108 mEq/L (ref 96–112)
Creatinine, Ser: 0.65 mg/dL (ref 0.50–1.10)
GFR calc Af Amer: >90 mL/min (ref >90)
GFR calc non Af Amer: 80 mL/min — ABNORMAL LOW (ref >90)
Glucose, Bld: 146 mg/dL — ABNORMAL HIGH (ref 70–99)
Potassium: 4.4 mEq/L (ref 3.5–5.1)
Sodium: 140 mEq/L (ref 135–145)

## 2011-11-21 LAB — CBC
HCT: 35.5 % — ABNORMAL LOW (ref 36.0–46.0)
HCT: 30.1 % — ABNORMAL LOW (ref 36.0–46.0)
Hemoglobin: 11.7 g/dL — ABNORMAL LOW (ref 12.0–15.0)
MCH: 29.9 pg (ref 26.0–34.0)
MCHC: 33 g/dL (ref 30.0–36.0)
MCV: 90.1 fL (ref 78.0–100.0)
Platelets: 126 10*3/uL — ABNORMAL LOW (ref 150–400)
RBC: 3.93 MIL/uL (ref 3.87–5.11)
RBC: 3.34 MIL/uL — ABNORMAL LOW (ref 3.87–5.11)
RDW: 15.4 % (ref 11.5–15.5)
WBC: 16.4 10*3/uL — ABNORMAL HIGH (ref 4.0–10.5)

## 2011-11-21 LAB — POCT I-STAT 7, (LYTES, BLD GAS, ICA,H+H)
Acid-Base Excess: 2 mmol/L (ref 0.0–2.0)
Acid-base deficit: 4 mmol/L — ABNORMAL HIGH (ref 0.0–2.0)
Acid-base deficit: 3 mmol/L — ABNORMAL HIGH (ref 0.0–2.0)
Bicarbonate: 27 mEq/L — ABNORMAL HIGH (ref 20.0–24.0)
Bicarbonate: 22.7 mEq/L (ref 20.0–24.0)
Calcium, Ion: 0.97 mmol/L — ABNORMAL LOW (ref 1.13–1.30)
Calcium, Ion: 1.14 mmol/L (ref 1.13–1.30)
Calcium, Ion: 0.88 mmol/L — ABNORMAL LOW (ref 1.13–1.30)
HCT: 34 % — ABNORMAL LOW (ref 36.0–46.0)
HCT: 36 % (ref 36.0–46.0)
Hemoglobin: 10.9 g/dL — ABNORMAL LOW (ref 12.0–15.0)
Hemoglobin: 13.9 g/dL (ref 12.0–15.0)
Hemoglobin: 12.2 g/dL (ref 12.0–15.0)
O2 Saturation: 100 %
O2 Saturation: 100 %
O2 Saturation: 100 %
Patient temperature: 36
Patient temperature: 35
Patient temperature: 35.4
Potassium: 4.4 mEq/L (ref 3.5–5.1)
Potassium: 4.4 mEq/L (ref 3.5–5.1)
Sodium: 137 mEq/L (ref 135–145)
Sodium: 141 mEq/L (ref 135–145)
TCO2: 28 mmol/L (ref 0–100)
TCO2: 24 mmol/L (ref 0–100)
pCO2 arterial: 42.7 mmHg (ref 35.0–45.0)
pCO2 arterial: 50 mmHg — ABNORMAL HIGH (ref 35.0–45.0)
pCO2 arterial: 38.9 mmHg (ref 35.0–45.0)
pH, Arterial: 7.405 (ref 7.350–7.450)
pH, Arterial: 7.357 (ref 7.350–7.450)
pH, Arterial: 7.267 — ABNORMAL LOW (ref 7.350–7.450)
pO2, Arterial: 328 mmHg — ABNORMAL HIGH (ref 80.0–100.0)
pO2, Arterial: 349 mmHg — ABNORMAL HIGH (ref 80.0–100.0)
pO2, Arterial: 432 mmHg — ABNORMAL HIGH (ref 80.0–100.0)
pO2, Arterial: 351 mmHg — ABNORMAL HIGH (ref 80.0–100.0)

## 2011-11-21 LAB — POCT I-STAT 4, (NA,K, GLUC, HGB,HCT)
Glucose, Bld: 267 mg/dL — ABNORMAL HIGH (ref 70–99)
HCT: 24 % — ABNORMAL LOW (ref 36.0–46.0)
Hemoglobin: 8.2 g/dL — ABNORMAL LOW (ref 12.0–15.0)

## 2011-11-21 LAB — BLOOD GAS, ARTERIAL
Bicarbonate: 20.6 mEq/L (ref 20.0–24.0)
Drawn by: 275531
O2 Saturation: 96.2 %
pCO2 arterial: 50.9 mmHg — ABNORMAL HIGH (ref 35.0–45.0)
pO2, Arterial: 74.8 mmHg — ABNORMAL LOW (ref 80.0–100.0)

## 2011-11-21 LAB — APTT: aPTT: 29 seconds (ref 24–37)

## 2011-11-21 LAB — PROTIME-INR: Prothrombin Time: 16.5 seconds — ABNORMAL HIGH (ref 11.6–15.2)

## 2011-11-21 LAB — PREPARE RBC (CROSSMATCH)

## 2011-11-21 SURGERY — ANEURYSM ABDOMINAL AORTIC REPAIR
Anesthesia: General | Site: Groin | Laterality: Right | Wound class: Clean

## 2011-11-21 SURGERY — LAPAROTOMY, EXPLORATORY
Anesthesia: General | Site: Abdomen | Wound class: Clean

## 2011-11-21 MED ORDER — LACTATED RINGERS IV SOLN
INTRAVENOUS | Status: DC | PRN
  Administered 2011-11-21 (×3): via INTRAVENOUS

## 2011-11-21 MED ORDER — LIDOCAINE HCL (CARDIAC) 20 MG/ML IV SOLN
INTRAVENOUS | Status: DC | PRN
  Administered 2011-11-21: 100 mg via INTRAVENOUS

## 2011-11-21 MED ORDER — LABETALOL HCL 5 MG/ML IV SOLN
INTRAVENOUS | Status: DC | PRN
  Administered 2011-11-21 (×3): 2.5 mg via INTRAVENOUS

## 2011-11-21 MED ORDER — OXYCODONE-ACETAMINOPHEN 5-325 MG PO TABS: 1.0000 | ORAL_TABLET | ORAL | Status: DC | PRN

## 2011-11-21 MED ORDER — LABETALOL HCL 5 MG/ML IV SOLN
10.0000 mg | INTRAVENOUS | Status: DC | PRN
  Administered 2011-11-24 – 2011-11-25 (×5): 10 mg via INTRAVENOUS
  Administered 2011-11-26: 11:00:00 via INTRAVENOUS
  Administered 2011-11-26 – 2011-12-01 (×2): 10 mg via INTRAVENOUS
  Filled 2011-11-21 (×9): qty 4

## 2011-11-21 MED ORDER — EPHEDRINE SULFATE 50 MG/ML IJ SOLN
INTRAMUSCULAR | Status: DC | PRN
  Administered 2011-11-21 (×2): 5 mg via INTRAVENOUS

## 2011-11-21 MED ORDER — SODIUM CHLORIDE 0.9 % IV SOLN
INTRAVENOUS | Status: DC | PRN
  Administered 2011-11-21 (×2): via INTRAVENOUS

## 2011-11-21 MED ORDER — CEFUROXIME SODIUM 1.5 G IJ SOLR
1.5000 g | Freq: Two times a day (BID) | INTRAMUSCULAR | Status: AC
  Administered 2011-11-22 (×2): 1.5 g via INTRAVENOUS
  Filled 2011-11-21 (×2): qty 1.5

## 2011-11-21 MED ORDER — VITAMIN D3 25 MCG (1000 UNIT) PO TABS
1000.0000 [IU] | ORAL_TABLET | Freq: Every day | ORAL | Status: DC
  Filled 2011-11-21 (×2): qty 1

## 2011-11-21 MED ORDER — ONDANSETRON HCL 4 MG/2ML IJ SOLN: 4.0000 mg | Freq: Once | INTRAMUSCULAR | Status: DC | PRN

## 2011-11-21 MED ORDER — ASPIRIN EC 325 MG PO TBEC
325.0000 mg | DELAYED_RELEASE_TABLET | Freq: Every day | ORAL | Status: DC
  Filled 2011-11-21 (×2): qty 1

## 2011-11-21 MED ORDER — MANNITOL 25 % IV SOLN
INTRAVENOUS | Status: DC | PRN
  Administered 2011-11-21 (×2): 12.5 g via INTRAVENOUS

## 2011-11-21 MED ORDER — CALCIUM CHLORIDE 10 % IV SOLN
INTRAVENOUS | Status: DC | PRN
  Administered 2011-11-21 (×2): 100 mg via INTRAVENOUS

## 2011-11-21 MED ORDER — METOPROLOL TARTRATE 1 MG/ML IV SOLN: 2.0000 mg | INTRAVENOUS | Status: DC | PRN

## 2011-11-21 MED ORDER — SENNOSIDES-DOCUSATE SODIUM 8.6-50 MG PO TABS
1.0000 | ORAL_TABLET | Freq: Every evening | ORAL | Status: DC | PRN
  Filled 2011-11-21: qty 1

## 2011-11-21 MED ORDER — DIPHENHYDRAMINE HCL 12.5 MG/5ML PO ELIX
25.0000 mg | ORAL_SOLUTION | Freq: Every evening | ORAL | Status: DC | PRN
  Filled 2011-11-21: qty 10

## 2011-11-21 MED ORDER — CHLORHEXIDINE GLUCONATE 0.12 % MT SOLN
15.0000 mL | Freq: Two times a day (BID) | OROMUCOSAL | Status: DC
  Administered 2011-11-21 – 2011-11-25 (×9): 15 mL via OROMUCOSAL
  Filled 2011-11-21 (×9): qty 15

## 2011-11-21 MED ORDER — SERTRALINE HCL 50 MG PO TABS
50.0000 mg | ORAL_TABLET | Freq: Every day | ORAL | Status: DC
  Filled 2011-11-21: qty 1

## 2011-11-21 MED ORDER — CEFAZOLIN SODIUM 1-5 GM-% IV SOLN
INTRAVENOUS | Status: AC
  Filled 2011-11-21: qty 50

## 2011-11-21 MED ORDER — SODIUM BICARBONATE 8.4 % IV SOLN
INTRAVENOUS | Status: AC
  Filled 2011-11-21: qty 50

## 2011-11-21 MED ORDER — PHENOL 1.4 % MT LIQD: 1.0000 | OROMUCOSAL | Status: DC | PRN

## 2011-11-21 MED ORDER — HYDRALAZINE HCL 20 MG/ML IJ SOLN
10.0000 mg | INTRAMUSCULAR | Status: DC | PRN
  Administered 2011-11-21 – 2011-11-30 (×4): 10 mg via INTRAVENOUS
  Filled 2011-11-21: qty 1
  Filled 2011-11-21 (×3): qty 0.5

## 2011-11-21 MED ORDER — ONDANSETRON HCL 4 MG/2ML IJ SOLN
INTRAMUSCULAR | Status: DC | PRN
  Administered 2011-11-21: 4 mg via INTRAVENOUS

## 2011-11-21 MED ORDER — CEFAZOLIN SODIUM-DEXTROSE 2-3 GM-% IV SOLR
INTRAVENOUS | Status: DC | PRN
  Administered 2011-11-21: 2 g via INTRAVENOUS

## 2011-11-21 MED ORDER — SODIUM CHLORIDE 0.9 % IV SOLN
500.0000 mL | Freq: Once | INTRAVENOUS | Status: AC | PRN
  Administered 2011-11-21 (×2): 500 mL via INTRAVENOUS

## 2011-11-21 MED ORDER — PANTOPRAZOLE SODIUM 40 MG IV SOLR
40.0000 mg | Freq: Every day | INTRAVENOUS | Status: DC
  Administered 2011-11-23 – 2011-11-29 (×7): 40 mg via INTRAVENOUS
  Filled 2011-11-21 (×9): qty 40

## 2011-11-21 MED ORDER — ONE-DAILY MULTI VITAMINS PO TABS: 1.0000 | ORAL_TABLET | Freq: Every day | ORAL | Status: DC

## 2011-11-21 MED ORDER — VITAMIN D 1000 UNITS PO CAPS: 1000.0000 [IU] | ORAL_CAPSULE | Freq: Every day | ORAL | Status: DC

## 2011-11-21 MED ORDER — SUCCINYLCHOLINE CHLORIDE 20 MG/ML IJ SOLN
INTRAMUSCULAR | Status: DC | PRN
  Administered 2011-11-21: 100 mg via INTRAVENOUS

## 2011-11-21 MED ORDER — MAGNESIUM SULFATE 40 MG/ML IJ SOLN
2.0000 g | Freq: Once | INTRAMUSCULAR | Status: AC | PRN
  Administered 2011-11-21: 2 g via INTRAVENOUS
  Filled 2011-11-21: qty 100

## 2011-11-21 MED ORDER — ETOMIDATE 2 MG/ML IV SOLN
INTRAVENOUS | Status: DC | PRN
  Administered 2011-11-21: 12 mg via INTRAVENOUS

## 2011-11-21 MED ORDER — MORPHINE SULFATE 2 MG/ML IJ SOLN
2.0000 mg | INTRAMUSCULAR | Status: DC | PRN
Start: 2011-11-21 — End: 2011-11-22
  Administered 2011-11-21: 4 mg via INTRAVENOUS
  Administered 2011-11-22: 2 mg via INTRAVENOUS
  Filled 2011-11-21: qty 2
  Filled 2011-11-21: qty 1

## 2011-11-21 MED ORDER — BISACODYL 10 MG RE SUPP: 10.0000 mg | Freq: Every day | RECTAL | Status: DC | PRN

## 2011-11-21 MED ORDER — PROPOFOL 10 MG/ML IV BOLUS
INTRAVENOUS | Status: DC | PRN
  Administered 2011-11-21 (×2): 100 mg via INTRAVENOUS

## 2011-11-21 MED ORDER — METOPROLOL TARTRATE 25 MG PO TABS
25.0000 mg | ORAL_TABLET | Freq: Two times a day (BID) | ORAL | Status: DC
  Filled 2011-11-21 (×3): qty 1

## 2011-11-21 MED ORDER — ONDANSETRON HCL 4 MG/2ML IJ SOLN: 4.0000 mg | Freq: Four times a day (QID) | INTRAMUSCULAR | Status: DC | PRN

## 2011-11-21 MED ORDER — VITAMIN C 500 MG PO TABS
500.0000 mg | ORAL_TABLET | Freq: Every day | ORAL | Status: DC
  Filled 2011-11-21 (×2): qty 1

## 2011-11-21 MED ORDER — KCL IN DEXTROSE-NACL 20-5-0.45 MEQ/L-%-% IV SOLN
INTRAVENOUS | Status: DC
  Administered 2011-11-21: 100 mL/h via INTRAVENOUS
  Administered 2011-11-21 – 2011-11-22 (×2): via INTRAVENOUS
  Administered 2011-11-25: 20 mL/h via INTRAVENOUS
  Filled 2011-11-21 (×5): qty 1000

## 2011-11-21 MED ORDER — PROTAMINE SULFATE 10 MG/ML IV SOLN
INTRAVENOUS | Status: DC | PRN
  Administered 2011-11-21: 100 mg via INTRAVENOUS

## 2011-11-21 MED ORDER — B COMPLEX PO TABS: 1.0000 | ORAL_TABLET | Freq: Every day | ORAL | Status: DC

## 2011-11-21 MED ORDER — DOPAMINE-DEXTROSE 3.2-5 MG/ML-% IV SOLN
INTRAVENOUS | Status: DC | PRN
  Administered 2011-11-21: 3 ug/kg/min via INTRAVENOUS

## 2011-11-21 MED ORDER — BIOTENE DRY MOUTH MT LIQD
15.0000 mL | Freq: Four times a day (QID) | OROMUCOSAL | Status: DC
  Administered 2011-11-22 – 2011-11-25 (×10): 15 mL via OROMUCOSAL

## 2011-11-21 MED ORDER — HEMOSTATIC AGENTS (NO CHARGE) OPTIME
TOPICAL | Status: DC | PRN
  Administered 2011-11-21 (×2): 1 via TOPICAL

## 2011-11-21 MED ORDER — HEPARIN SODIUM (PORCINE) 1000 UNIT/ML IJ SOLN
INTRAMUSCULAR | Status: AC
  Filled 2011-11-21: qty 1

## 2011-11-21 MED ORDER — ROCURONIUM BROMIDE 100 MG/10ML IV SOLN
INTRAVENOUS | Status: DC | PRN
  Administered 2011-11-21 (×2): 50 mg via INTRAVENOUS

## 2011-11-21 MED ORDER — SODIUM CHLORIDE 0.9 % IR SOLN
Status: DC | PRN
  Administered 2011-11-21: 09:00:00

## 2011-11-21 MED ORDER — LACTATED RINGERS IV SOLN
INTRAVENOUS | Status: DC | PRN
  Administered 2011-11-21 (×2): via INTRAVENOUS

## 2011-11-21 MED ORDER — FENTANYL CITRATE 0.05 MG/ML IJ SOLN
INTRAMUSCULAR | Status: DC | PRN
  Administered 2011-11-21: 75 ug via INTRAVENOUS
  Administered 2011-11-21: 100 ug via INTRAVENOUS
  Administered 2011-11-21 (×2): 50 ug via INTRAVENOUS
  Administered 2011-11-21: 100 ug via INTRAVENOUS
  Administered 2011-11-21 (×2): 50 ug via INTRAVENOUS
  Administered 2011-11-21 (×2): 100 ug via INTRAVENOUS
  Administered 2011-11-21: 25 ug via INTRAVENOUS

## 2011-11-21 MED ORDER — SODIUM CHLORIDE 0.9 % IV SOLN
10.0000 mg | INTRAVENOUS | Status: DC | PRN
  Administered 2011-11-21: 5 ug/min via INTRAVENOUS

## 2011-11-21 MED ORDER — PANTOPRAZOLE SODIUM 40 MG PO TBEC
40.0000 mg | DELAYED_RELEASE_TABLET | Freq: Every day | ORAL | Status: DC
Start: 2011-11-21 — End: 2011-11-21

## 2011-11-21 MED ORDER — GLYCOPYRROLATE 0.2 MG/ML IJ SOLN
INTRAMUSCULAR | Status: DC | PRN
  Administered 2011-11-21: 0.1 mg via INTRAVENOUS

## 2011-11-21 MED ORDER — ACETAMINOPHEN 325 MG PO TABS: 325.0000 mg | ORAL_TABLET | ORAL | Status: DC | PRN

## 2011-11-21 MED ORDER — SODIUM CHLORIDE 0.9 % IV SOLN: INTRAVENOUS | Status: DC

## 2011-11-21 MED ORDER — 0.9 % SODIUM CHLORIDE (POUR BTL) OPTIME
TOPICAL | Status: DC | PRN
  Administered 2011-11-21 (×2): 1000 mL

## 2011-11-21 MED ORDER — SODIUM BICARBONATE 8.4 % IV SOLN
50.0000 meq | Freq: Once | INTRAVENOUS | Status: AC
  Administered 2011-11-21: 50 meq via INTRAVENOUS

## 2011-11-21 MED ORDER — FENTANYL CITRATE 0.05 MG/ML IJ SOLN
INTRAMUSCULAR | Status: DC | PRN
  Administered 2011-11-21: 100 ug via INTRAVENOUS
  Administered 2011-11-21 (×3): 50 ug via INTRAVENOUS

## 2011-11-21 MED ORDER — ALBUMIN HUMAN 5 % IV SOLN
INTRAVENOUS | Status: DC | PRN
  Administered 2011-11-21 (×2): via INTRAVENOUS

## 2011-11-21 MED ORDER — KCL IN DEXTROSE-NACL 20-5-0.45 MEQ/L-%-% IV SOLN
INTRAVENOUS | Status: AC
  Filled 2011-11-21: qty 1000

## 2011-11-21 MED ORDER — ALUM & MAG HYDROXIDE-SIMETH 200-200-20 MG/5ML PO SUSP: 15.0000 mL | ORAL | Status: DC | PRN

## 2011-11-21 MED ORDER — ACETAMINOPHEN 325 MG RE SUPP
325.0000 mg | RECTAL | Status: DC | PRN
  Filled 2011-11-21: qty 2

## 2011-11-21 MED ORDER — MIDAZOLAM HCL 5 MG/5ML IJ SOLN
INTRAMUSCULAR | Status: DC | PRN
  Administered 2011-11-21 (×2): 1 mg via INTRAVENOUS

## 2011-11-21 MED ORDER — LIDOCAINE HCL (CARDIAC) 20 MG/ML IV SOLN
INTRAVENOUS | Status: DC | PRN
  Administered 2011-11-21: 40 mg via INTRAVENOUS

## 2011-11-21 MED ORDER — CALCIUM CARBONATE-VITAMIN D 500-200 MG-UNIT PO TABS
1.0000 | ORAL_TABLET | Freq: Two times a day (BID) | ORAL | Status: DC
  Filled 2011-11-21 (×3): qty 1

## 2011-11-21 MED ORDER — HYDROMORPHONE HCL PF 1 MG/ML IJ SOLN: 0.2500 mg | INTRAMUSCULAR | Status: DC | PRN

## 2011-11-21 MED ORDER — POTASSIUM CHLORIDE CRYS ER 20 MEQ PO TBCR: 20.0000 meq | EXTENDED_RELEASE_TABLET | Freq: Once | ORAL | Status: AC | PRN

## 2011-11-21 MED ORDER — OMEGA-3 FATTY ACIDS 1000 MG PO CAPS: 1.0000 g | ORAL_CAPSULE | Freq: Every day | ORAL | Status: DC

## 2011-11-21 MED ORDER — DOPAMINE-DEXTROSE 3.2-5 MG/ML-% IV SOLN: 3.0000 ug/kg/min | INTRAVENOUS | Status: DC

## 2011-11-21 MED ORDER — PROPOFOL INFUSION 10 MG/ML OPTIME
INTRAVENOUS | Status: DC | PRN
  Administered 2011-11-21: 15 ug/kg/min via INTRAVENOUS

## 2011-11-21 MED ORDER — 0.9 % SODIUM CHLORIDE (POUR BTL) OPTIME
TOPICAL | Status: DC | PRN
  Administered 2011-11-21: 2000 mL
  Administered 2011-11-21: 3000 mL

## 2011-11-21 MED ORDER — OMEGA-3-ACID ETHYL ESTERS 1 G PO CAPS
1.0000 g | ORAL_CAPSULE | Freq: Every day | ORAL | Status: DC
  Filled 2011-11-21 (×2): qty 1

## 2011-11-21 MED ORDER — PANTOPRAZOLE SODIUM 40 MG PO TBEC: 40.0000 mg | DELAYED_RELEASE_TABLET | Freq: Every day | ORAL | Status: DC

## 2011-11-21 MED ORDER — SODIUM CHLORIDE 0.9 % IR SOLN
Status: DC | PRN
  Administered 2011-11-21: 19:00:00

## 2011-11-21 MED ORDER — VECURONIUM BROMIDE 10 MG IV SOLR
INTRAVENOUS | Status: DC | PRN
  Administered 2011-11-21: 5 mg via INTRAVENOUS
  Administered 2011-11-21 (×4): 1 mg via INTRAVENOUS

## 2011-11-21 MED ORDER — THROMBIN 20000 UNITS EX SOLR
CUTANEOUS | Status: AC
  Filled 2011-11-21: qty 20000

## 2011-11-21 MED ORDER — ADULT MULTIVITAMIN W/MINERALS CH
1.0000 | ORAL_TABLET | Freq: Every day | ORAL | Status: DC
  Filled 2011-11-21 (×2): qty 1

## 2011-11-21 MED ORDER — MECLIZINE HCL 25 MG PO TABS
25.0000 mg | ORAL_TABLET | ORAL | Status: DC | PRN
  Filled 2011-11-21: qty 1

## 2011-11-21 MED ORDER — GUAIFENESIN-DM 100-10 MG/5ML PO SYRP: 15.0000 mL | ORAL_SOLUTION | ORAL | Status: DC | PRN

## 2011-11-21 MED ORDER — B COMPLEX-C PO TABS
1.0000 | ORAL_TABLET | Freq: Every day | ORAL | Status: DC
  Filled 2011-11-21 (×2): qty 1

## 2011-11-21 MED ORDER — HEPARIN SODIUM (PORCINE) 1000 UNIT/ML IJ SOLN
INTRAMUSCULAR | Status: DC | PRN
  Administered 2011-11-21: 2000 [IU] via INTRAVENOUS
  Administered 2011-11-21: 6000 [IU] via INTRAVENOUS

## 2011-11-21 MED ORDER — DOCUSATE SODIUM 100 MG PO CAPS: 100.0000 mg | ORAL_CAPSULE | Freq: Every day | ORAL | Status: DC

## 2011-11-21 MED ORDER — PROPOFOL 10 MG/ML IV EMUL
0.0000 ug/kg/min | INTRAVENOUS | Status: DC
  Administered 2011-11-22: 10 ug/kg/min via INTRAVENOUS
  Administered 2011-11-22: 25 ug/kg/min via INTRAVENOUS
  Administered 2011-11-23: 10 ug/kg/min via INTRAVENOUS
  Filled 2011-11-21 (×3): qty 100

## 2011-11-21 SURGICAL SUPPLY — 75 items
BAIR HUGGER FULL ACCESS UNDERBODY ×3 IMPLANT
BAIR HUGGER STER HORSESHOE (MISCELLANEOUS) ×3 IMPLANT
BLADE SURG CLIPPER 3M 9600 (MISCELLANEOUS) ×3 IMPLANT
CANISTER SUCTION 2500CC (MISCELLANEOUS) ×3 IMPLANT
CATH EMB 4FR 80CM (CATHETERS) ×3 IMPLANT
CATH EMB 5FR 80CM (CATHETERS) ×3 IMPLANT
CLIP TI LARGE 6 (CLIP) ×3 IMPLANT
CLIP TI MEDIUM 24 (CLIP) ×3 IMPLANT
CLIP TI WIDE RED SMALL 24 (CLIP) ×3 IMPLANT
CLOTH BEACON ORANGE TIMEOUT ST (SAFETY) ×3 IMPLANT
COVER SURGICAL LIGHT HANDLE (MISCELLANEOUS) ×3 IMPLANT
DRAPE WARM FLUID 44X44 (DRAPE) ×3 IMPLANT
DRSG COVADERM 4X14 (GAUZE/BANDAGES/DRESSINGS) ×3 IMPLANT
ELECT BLADE 4.0 EZ CLEAN MEGAD (MISCELLANEOUS) ×3
ELECT BLADE 6.5 EXT (BLADE) ×3 IMPLANT
ELECT REM PT RETURN 9FT ADLT (ELECTROSURGICAL) ×3
ELECTRODE BLDE 4.0 EZ CLN MEGD (MISCELLANEOUS) ×2 IMPLANT
ELECTRODE REM PT RTRN 9FT ADLT (ELECTROSURGICAL) ×2 IMPLANT
FELT TEFLON 4 X1 (Mesh General) ×3 IMPLANT
GLOVE BIOGEL PI IND STRL 6.5 (GLOVE) ×10 IMPLANT
GLOVE BIOGEL PI IND STRL 7.0 (GLOVE) ×6 IMPLANT
GLOVE BIOGEL PI IND STRL 7.5 (GLOVE) ×4 IMPLANT
GLOVE BIOGEL PI INDICATOR 6.5 (GLOVE) ×5
GLOVE BIOGEL PI INDICATOR 7.0 (GLOVE) ×3
GLOVE BIOGEL PI INDICATOR 7.5 (GLOVE) ×2
GLOVE ECLIPSE 6.5 STRL STRAW (GLOVE) ×3 IMPLANT
GLOVE SS BIOGEL STRL SZ 7 (GLOVE) ×6 IMPLANT
GLOVE SUPERSENSE BIOGEL SZ 7 (GLOVE) ×3
GLOVE SURG SS PI 6.5 STRL IVOR (GLOVE) ×6 IMPLANT
GLOVE SURG SS PI 7.0 STRL IVOR (GLOVE) ×9 IMPLANT
GLOVE SURG SS PI 7.5 STRL IVOR (GLOVE) ×3 IMPLANT
GOWN BRE IMP SLV SIRUS LXLNG (GOWN DISPOSABLE) ×6 IMPLANT
GOWN PREVENTION PLUS XXLARGE (GOWN DISPOSABLE) ×6 IMPLANT
GOWN STRL NON-REIN LRG LVL3 (GOWN DISPOSABLE) ×12 IMPLANT
GOWN STRL REIN 2XL LVL4 (GOWN DISPOSABLE) ×3 IMPLANT
GOWN STRL REIN XL XLG (GOWN DISPOSABLE) ×6 IMPLANT
GRAFT HEMASHIELD 16X8MM (Vascular Products) ×3 IMPLANT
INSERT FOGARTY 61MM (MISCELLANEOUS) ×3 IMPLANT
INSERT FOGARTY SM (MISCELLANEOUS) ×6 IMPLANT
KIT BASIN OR (CUSTOM PROCEDURE TRAY) ×3 IMPLANT
KIT ROOM TURNOVER OR (KITS) ×3 IMPLANT
LOOP VESSEL MAXI BLUE (MISCELLANEOUS) IMPLANT
LOOP VESSEL MINI RED (MISCELLANEOUS) IMPLANT
NS IRRIG 1000ML POUR BTL (IV SOLUTION) ×6 IMPLANT
PACK AORTA (CUSTOM PROCEDURE TRAY) ×3 IMPLANT
PAD ARMBOARD 7.5X6 YLW CONV (MISCELLANEOUS) ×6 IMPLANT
SPECIMEN JAR MEDIUM (MISCELLANEOUS) ×3 IMPLANT
SPONGE LAP 18X18 X RAY DECT (DISPOSABLE) ×3 IMPLANT
SPONGE LAP 4X18 X RAY DECT (DISPOSABLE) ×3 IMPLANT
STAPLER VISISTAT 35W (STAPLE) ×6 IMPLANT
SURGILUBE 3G PEEL PACK STRL (MISCELLANEOUS) ×6 IMPLANT
SUT ETHIBOND 5 LR DA (SUTURE) IMPLANT
SUT PROLENE 1 XLH 60 (SUTURE) ×6 IMPLANT
SUT PROLENE 3 0 SH1 36 (SUTURE) ×9 IMPLANT
SUT PROLENE 4 0 RB 1 (SUTURE) ×1
SUT PROLENE 4 0 SH DA (SUTURE) ×3 IMPLANT
SUT PROLENE 4-0 RB1 .5 CRCL 36 (SUTURE) ×2 IMPLANT
SUT PROLENE 5 0 C 1 24 (SUTURE) ×3 IMPLANT
SUT PROLENE 5 0 C 1 36 (SUTURE) ×6 IMPLANT
SUT PROLENE 5 0 CC 1 (SUTURE) ×30 IMPLANT
SUT PROLENE 5 0 CC1 (SUTURE) ×6 IMPLANT
SUT PROLENE 6 0 C 1 30 (SUTURE) ×9 IMPLANT
SUT SILK 1 TIES 10X30 (SUTURE) ×3 IMPLANT
SUT SILK 2 0 SH CR/8 (SUTURE) ×6 IMPLANT
SUT SILK 3 0 SH CR/8 (SUTURE) IMPLANT
SUT VIC AB 2-0 CTX 36 (SUTURE) IMPLANT
SUT VIC AB 3-0 MH 27 (SUTURE) ×9 IMPLANT
SUT VIC AB 3-0 SH 27 (SUTURE)
SUT VIC AB 3-0 SH 27X BRD (SUTURE) IMPLANT
SYR 3ML LL SCALE MARK (SYRINGE) ×6 IMPLANT
Supple Peri-Guard Repair Patch 4cmx 4cm ×3 IMPLANT
TOWEL OR 17X24 6PK STRL BLUE (TOWEL DISPOSABLE) ×6 IMPLANT
TOWEL OR 17X26 10 PK STRL BLUE (TOWEL DISPOSABLE) ×6 IMPLANT
TRAY FOLEY CATH 14FRSI W/METER (CATHETERS) ×6 IMPLANT
WATER STERILE IRR 1000ML POUR (IV SOLUTION) ×9 IMPLANT

## 2011-11-21 SURGICAL SUPPLY — 53 items
CANISTER SUCTION 2500CC (MISCELLANEOUS) ×3 IMPLANT
CLIP TI MEDIUM 24 (CLIP) ×3 IMPLANT
CLIP TI WIDE RED SMALL 24 (CLIP) ×3 IMPLANT
CLOTH BEACON ORANGE TIMEOUT ST (SAFETY) ×3 IMPLANT
COVER SURGICAL LIGHT HANDLE (MISCELLANEOUS) ×3 IMPLANT
DRAPE WARM FLUID 44X44 (DRAPE) ×3 IMPLANT
ELECT BLADE 6.5 EXT (BLADE) IMPLANT
ELECT REM PT RETURN 9FT ADLT (ELECTROSURGICAL) ×3
ELECTRODE REM PT RTRN 9FT ADLT (ELECTROSURGICAL) ×2 IMPLANT
GLOVE BIO SURGEON STRL SZ7 (GLOVE) ×3 IMPLANT
GLOVE BIOGEL PI IND STRL 7.5 (GLOVE) ×8 IMPLANT
GLOVE BIOGEL PI INDICATOR 7.5 (GLOVE) ×4
GLOVE SS BIOGEL STRL SZ 7 (GLOVE) ×4 IMPLANT
GLOVE SUPERSENSE BIOGEL SZ 7 (GLOVE) ×2
GOWN STRL NON-REIN LRG LVL3 (GOWN DISPOSABLE) ×12 IMPLANT
HEMOSTAT SNOW SURGICEL 2X4 (HEMOSTASIS) ×3 IMPLANT
INSERT FOGARTY 61MM (MISCELLANEOUS) ×3 IMPLANT
INSERT FOGARTY SM (MISCELLANEOUS) ×12 IMPLANT
KIT BASIN OR (CUSTOM PROCEDURE TRAY) ×3 IMPLANT
KIT REMOVER STAPLE SKIN (MISCELLANEOUS) ×9 IMPLANT
KIT ROOM TURNOVER OR (KITS) ×3 IMPLANT
LOOP VESSEL MAXI BLUE (MISCELLANEOUS) IMPLANT
LOOP VESSEL MINI RED (MISCELLANEOUS) IMPLANT
NS IRRIG 1000ML POUR BTL (IV SOLUTION) ×6 IMPLANT
PACK AORTA (CUSTOM PROCEDURE TRAY) ×3 IMPLANT
PAD ARMBOARD 7.5X6 YLW CONV (MISCELLANEOUS) ×6 IMPLANT
SPECIMEN JAR MEDIUM (MISCELLANEOUS) ×3 IMPLANT
SPONGE GAUZE 4X4 12PLY (GAUZE/BANDAGES/DRESSINGS) ×3 IMPLANT
SPONGE LAP 18X18 X RAY DECT (DISPOSABLE) IMPLANT
SPONGE LAP 4X18 X RAY DECT (DISPOSABLE) ×3 IMPLANT
STAPLER VISISTAT 35W (STAPLE) ×6 IMPLANT
SURGIFLO W/THROMBIN 8M KIT (HEMOSTASIS) ×3 IMPLANT
SUT ETHIBOND 5 LR DA (SUTURE) IMPLANT
SUT PDS II 0 TP-1 LOOPED 60 (SUTURE) ×6 IMPLANT
SUT PROLENE 1 XLH 60 (SUTURE) ×6 IMPLANT
SUT PROLENE 3 0 SH1 36 (SUTURE) ×3 IMPLANT
SUT PROLENE 4 0 RB 1 (SUTURE)
SUT PROLENE 4-0 RB1 .5 CRCL 36 (SUTURE) IMPLANT
SUT PROLENE 5 0 C 1 36 (SUTURE) ×3 IMPLANT
SUT PROLENE 5 0 CC 1 (SUTURE) IMPLANT
SUT PROLENE 5 0 CC1 (SUTURE) ×3 IMPLANT
SUT PROLENE 6 0 C 1 30 (SUTURE) ×3 IMPLANT
SUT SILK 2 0 SH CR/8 (SUTURE) ×3 IMPLANT
SUT SILK 3 0 SH CR/8 (SUTURE) IMPLANT
SUT VIC AB 2-0 CTX 36 (SUTURE) IMPLANT
SUT VIC AB 3-0 MH 27 (SUTURE) ×9 IMPLANT
SUT VIC AB 3-0 SH 27 (SUTURE)
SUT VIC AB 3-0 SH 27X BRD (SUTURE) IMPLANT
TAPE CLOTH SURG 4X10 WHT LF (GAUZE/BANDAGES/DRESSINGS) ×3 IMPLANT
TOWEL OR 17X24 6PK STRL BLUE (TOWEL DISPOSABLE) ×6 IMPLANT
TOWEL OR 17X26 10 PK STRL BLUE (TOWEL DISPOSABLE) ×6 IMPLANT
TRAY FOLEY CATH 14FRSI W/METER (CATHETERS) IMPLANT
WATER STERILE IRR 1000ML POUR (IV SOLUTION) ×6 IMPLANT

## 2011-11-21 NOTE — Progress Notes (Signed)
Pt being taken back to the OR by Dr Hart Rochester

## 2011-11-21 NOTE — Op Note (Signed)
OPERATIVE REPORT  Date of Surgery: 11/21/2011  Surgeon: Josephina Gip, MD  Assistant: Dr. Durene Cal, Lianne Cure PA  Pre-op Diagnosis: Abdominal aortic aneurysm-Peri and infra renal AAA Post-op Diagnosis: Same plus intraoperative laceration left iliac vein  Procedure: Procedure(s): #1 resection and grafting of infrarenal abdominal aortic aneurysm with insertion aorta note by common iliac graft using 16 x 8 mm Hemashield Dacron graft #2 repair laceration of left renal vein with patch angioplasty using bovine patch  Anesthesia: General  EBL: 1500 cc  Complications: None  Procedure Details: Patient going to the operating room placed in the supine position at which time satisfactory general endotracheal anesthesia was administered. A radial arterial line and Swan-Ganz catheter were inserted by anesthesia for monitoring purposes. Abdomen and groins were prepped with Betadine scrub and solution draped in routine sterile manner. Midline incision was made from xiphoid to pubis carried down through subcutaneous tissue and linea alba using the Bovie. Perineal cavity was entered and thoroughly explored. The stomach duodenum small bowel and colon unremarkable. The liver was smooth no masses palpable. Gallbladder appeared normal no stones were palpable. Transverse colon was elevated and the intestines reflected to the right side retroperitoneum incised exposing a 5-6 cm infrarenal aortic aneurysm. Both common iliac arteries were exposed and carefully circumferentially dissected free. It was noted that the iliac veins were quite thin and friable in appearance and extreme care was taken not to injure the veins. The arteries were quite high and there were 2 renal arteries on each side. In order to get any access to the peri-renal aorta it was necessary to divide the left renal vein. It was doubly ligated with #1 silk ties being careful to preserve collateral flow through the adrenal branch and gonadal  branch. This exposed the aorta nicely and control was obtained proximal to the renal arteries. The aneurysm extended up to both renal arteries bilaterally. Patient be given 25 g of mannitol and was now heparinized. Aorta was occluded proximal to the renal arteries both common iliac arteries occluded with vascular clamps and the renal arteries all temporarily occluded with vascular bulldogs. Aneurysm was opened anteriorly lumbar branches oversewn with 2-0 silk figure-of-eight sutures from within. There was no laminated thrombus within the aneurysm sac. The aorta was transected circumferentially about 2 cm distal to both sets of renal arteries. There was a lot of calcium in the posterior lateral of the aorta and it did not appear feasible to endarterectomize this. Anterior wall was relatively soft. A 16 x 8 mm Hemashield Dacron graft was fashioned appropriately sewn end in the proximal aortic stump using continuous 3-0 Prolene buttressing this the stripper fell. There were some areas of calcification posteriorly making this technically difficult but there were no leaks when it was completed. Following this the clamps were replaced to the infrarenal aorta reestablishing flow to the renal arteries and 38 minutes. There was good diuresis over the next few hours following this. Beginning on the left side the common iliac artery was transected. It was diffusely diseased but widely patent. It was necessary to endarterectomize the left common femoral artery to perform an anastomosis between between the left limb of the graft left common iliac artery a 5-0 Prolene. This was checked for leaks none were present. Following this we returned to the right side. We carefully then expose the right common iliac artery slightly distally at this time significant venous bleeding came from the left iliac vein despite our careful approach. There was obviously a tear in the  left iliac vein at that point. At this point manual control of the  linear tear in the left iliac vein which is an extremely friable fine was controlled manually by while the right and left common iliac veins were controlled as was the proximal inferior vena cava with external compression. The in the left iliac vein at its junction with the right iliac vein into the inferior vena cava was visualized. It was about 4 cm in length and because of the friable nature of the anterior aspect of iliac vein this required a patch angioplasty to repair. We used a bovine patch sewn in place with 5-0 Prolene following completion there was good flow through both iliac veins and the vena cava and attention was returned to the right common iliac artery which was anastomosed end to end to the right limb of the graft with 5-0 Prolene. Following completion of this clamps released there was an excellent pulse in both limbs. There was a palpable pulse in the left femoral artery but there is not good Doppler flow initially. Because of the diffuse disease in the iliac system I clamped the left limb of the graft made a small opening just proximal to the anastomosis of the left common iliac artery with 11 blade. Passed a 5 Fogarty which easily traversed the entire leg to the ankle and upon return no thrombus was returned. The opening in the graft was reclosed with 6-0 Prolene clamp released at this point there was excellent Doppler flow in the proximal left iliac as well as the common femoral artery and the posterior tibial artery at the ankle. There was good Doppler flow in the right femoral artery as well. Adequate hemostasis was achieved following administration of protamine and the aneurysm sac was closed over the graft with 3-0 Vicryl retroperitoneum approximated with 3-0 Vicryl thorough irrigation peritoneal cavity linea alba closed with #1 Prolene the skin with staples. Both feet had audible Doppler flow the posterior tibial arteries left better than right as it was preoperatively. Patient was taken  to the recovery in stable condition on the ventilator  Patient received 3 units of packed red blood cells, 500 cc of blood from the Cell Saver during the procedure and was stable hemodynamically throughout   Josephina Gip, MD 11/21/2011 1:00 PM

## 2011-11-21 NOTE — H&P (View-Only) (Signed)
Subjective:     Patient ID: Lauren Morrow, female   DOB: 08/21/1924, 78 y.o.   MRN: [id number]  HPI this 78-year-old female returns today to discuss treatment options for her abdominal aortic aneurysm. She had a CT angiogram performed earlier today which I have reviewed by computer. It was hoped that she would be a candidate for aortic stent grafting but there is no significantinfra renal neck. Patient is not a candidate for aortic stent grafting. Patient did have aortic valve replacement performed 12 months ago and was slow to recover. She does ambulate on a daily basis however does not have severe COPD. She has good ventricular function and her valve is functioning well.   Past Medical History  Diagnosis Date  . Varicose veins   . Seasonal allergies   . Osteomyelitis     history of osteomyelistis in the leg  . Aortic stenosis   . Dizziness   . Hypertension   . Atrial fibrillation     Post op  . Peripheral vascular disease     History  Substance Use Topics  . Smoking status: Never Smoker   . Smokeless tobacco: Never Used  . Alcohol Use: No    Family History  Problem Relation Age of Onset  . Other Mother     Varicose Veins  . Cancer Sister   . Hyperlipidemia Brother   . Other Brother     Allergies  Allergen Reactions  . Avelox (Moxifloxacin Hcl In Nacl) Rash    Avelox began at approximately 0800. At 0807 pt noted to have red-streaking proximal to the PIV site, up the left arm, to the neck/chest area. Avelox immediately discontinued. Pt received about 1/4 the dose or 100mg. Vital signs remained stable. The anesthesiologist and surgeon were notified and shown the site of red, rashy streaking.  . Penicillins Swelling  . Sulfa Antibiotics Other (See Comments)    unknown    Current outpatient prescriptions:aspirin EC 325 MG tablet, Take 1 tablet (325 mg total) by mouth daily., Disp: 30 tablet, Rfl: 0;  b complex vitamins tablet, Take 1 tablet by mouth daily., Disp: , Rfl: ;   calcium-vitamin D (OSCAL WITH D) 500-200 MG-UNIT per tablet, Take 1 tablet by mouth 2 (two) times daily.  , Disp: , Rfl: ;  Cholecalciferol (VITAMIN D) 1000 UNITS capsule, Take 1,000 Units by mouth daily., Disp: , Rfl:  diphenhydrAMINE (BENADRYL) 25 mg capsule, Take 25 mg by mouth at bedtime.  , Disp: , Rfl: ;  fish oil-omega-3 fatty acids 1000 MG capsule, Take 1 g by mouth daily.  , Disp: , Rfl: ;  meclizine (ANTIVERT) 25 MG tablet, Take 25 mg by mouth as needed., Disp: , Rfl: ;  Multiple Vitamin (MULTIVITAMIN) tablet, Take 1 tablet by mouth daily.  , Disp: , Rfl: ;  omeprazole (PRILOSEC) 20 MG capsule, Take 20 mg by mouth daily., Disp: , Rfl:  sertraline (ZOLOFT) 50 MG tablet, Take 50 mg by mouth daily. , Disp: , Rfl: ;  vitamin C (ASCORBIC ACID) 500 MG tablet, Take 500 mg by mouth daily.  , Disp: , Rfl: ;  diphenhydrAMINE (BENADRYL) 12.5 MG/5ML elixir, Take 10 mLs (25 mg total) by mouth at bedtime as needed for sleep., Disp: 120 mL, Rfl: 0 No current facility-administered medications for this visit. Facility-Administered Medications Ordered in Other Visits: iohexol (OMNIPAQUE) 350 MG/ML injection 80 mL, 80 mL, Intravenous, Once PRN, Medication Radiologist, MD, 80 mL at 10/28/11 0904  BP 150/79  Pulse 73  Resp   20  Ht 5' 8" (1.727 m)  Wt 146 lb 6.4 oz (66.407 kg)  BMI 22.26 kg/m2  Body mass index is 22.26 kg/(m^2).          Review of Systems     Objective:   Physical Exam blood pressure 150/79 heart rate 73 respirations 20 Gen.-alert and oriented x3 in no apparent distress HEENT normal for age Lungs no rhonchi or wheezing Cardiovascular regular rhythm no murmurs carotid pulses 3+ palpable no bruits audible-crisp aortic valve sounds  Abdomen soft nontender no palpable masses Musculoskeletal free of  major deformities Skin clear -no rashes Neurologic normal Lower extremities 3+ femoral and popliteal pulses palpable bilaterally with no edema       Assessment:     I discussed  the situation at length with the patient and her family today as well as Dr. Arida Patient has an aneurysm which is enlarged approximately 0.6 cm in the past 6-8 months and currently measures about 5.4 cm in maximum diameter I discussed mortality and morbidity of an open aneurysm resection with the patient and gave the option of following this aneurysm versus open repair.    Plan:     Patient and family will meet with Dr.Aridaon Thursday of this week to further discuss this option of surgery versus continued observation. I will be in touch with us regarding their decision      

## 2011-11-21 NOTE — Consult Note (Signed)
Name: Lauren Morrow MRN: [id number] DOB: 12-04-1924    LOS: 0  Referring Provider:  Dr. Hart Rochester Reason for Referral:  Ventilator management post op  PULMONARY / CRITICAL CARE MEDICINE  HPI:  Lauren Morrow is an 78 y/o woman who underwent AAA repair this afternoon that was complicated by peritoneal bleed after surgery requiring return to the OR for repair.  Her immediate post op Hg was 11, this dropped acutely to 8 and she had a hypotensive event.  She was returned to the OR, the source of bleeding was identified and repaired.  She remained intubated post-operatively for airway protection.   Past Medical History  Diagnosis Date  . Varicose veins   . Seasonal allergies   . Osteomyelitis     history of osteomyelistis in the leg  . Aortic stenosis     Dr. Terrilee Files, saw last 09/11/11  . Dizziness   . Peripheral vascular disease   . Hypertension     sees Dr. Sherril Croon, primary  . Atrial fibrillation     Post op, sees  Dr. Kirke Corin  . Anxiety   . Shortness of breath     "gets short of breath at time since the heart surgery 2012"  . Urinary tract infection     hx of  . Kidney stones     hx of  . GERD (gastroesophageal reflux disease)   . Arthritis   . Anemia     hx of  . Low back pain   . Cataracts, bilateral    Past Surgical History  Procedure Date  . Spine surgery 1967  1976    dr. Ollen Bowl  . Aortic valve replacement 11/22/2010    21mm pericardial valve serial#[id number], model [id number]  . Cardiac catheterization   . Aortic valve replacement     In 2012 with a bioprosthetic valve  . Bladder tact     1960's   Prior to Admission medications   Medication Sig Start Date End Date Taking? Authorizing Provider  aspirin EC 325 MG tablet Take 1 tablet (325 mg total) by mouth daily. 10/09/11  Yes Iran Ouch, MD  b complex vitamins tablet Take 1 tablet by mouth daily.   Yes Historical Provider, MD  calcium-vitamin D (OSCAL WITH D) 500-200 MG-UNIT per tablet Take 1 tablet by mouth 2  (two) times daily.     Yes Historical Provider, MD  Cholecalciferol (VITAMIN D) 1000 UNITS capsule Take 1,000 Units by mouth daily.   Yes Historical Provider, MD  fish oil-omega-3 fatty acids 1000 MG capsule Take 1 g by mouth daily.     Yes Historical Provider, MD  meclizine (ANTIVERT) 25 MG tablet Take 25 mg by mouth as needed.   Yes Historical Provider, MD  metoprolol tartrate (LOPRESSOR) 25 MG tablet Take 25 mg by mouth 2 (two) times daily.   Yes Historical Provider, MD  omeprazole (PRILOSEC) 20 MG capsule Take 20 mg by mouth daily.   Yes Historical Provider, MD  sertraline (ZOLOFT) 50 MG tablet Take 50 mg by mouth daily.  12/20/10  Yes Historical Provider, MD  diphenhydrAMINE (BENADRYL) 12.5 MG/5ML elixir Take 10 mLs (25 mg total) by mouth at bedtime as needed for sleep. 11/29/10 12/09/10  Donielle Margaretann Loveless, PA  Multiple Vitamin (MULTIVITAMIN) tablet Take 1 tablet by mouth daily.      Historical Provider, MD  vitamin C (ASCORBIC ACID) 500 MG tablet Take 500 mg by mouth daily.      Historical Provider, MD   Allergies  Allergies  Allergen Reactions  . Avelox (Moxifloxacin Hcl In Nacl) Rash    Avelox began at approximately 0800. At 0807 pt noted to have red-streaking proximal to the PIV site, up the left arm, to the neck/chest area. Avelox immediately discontinued. Pt received about 1/4 the dose or 100mg . Vital signs remained stable. The anesthesiologist and surgeon were notified and shown the site of red, rashy streaking.  Marland Kitchen Penicillins Swelling  . Sulfa Antibiotics Other (See Comments)    unknown    Family History Family History  Problem Relation Age of Onset  . Other Mother     Varicose Veins  . Cancer Sister   . Hyperlipidemia Brother   . Other Brother    Social History  reports that she has never smoked. She has never used smokeless tobacco. She reports that she does not drink alcohol or use illicit drugs.  Review Of Systems:  Could not be obtained as pt was intubated and  sedated  Brief patient description:  78 y/o woman s/p AAA repair who remained intubated after procedure  Events Since Admission: Requiring mechanical ventilation over night.   Current Status:  Vital Signs: Temp:  [96.8 F (36 C)-98.2 F (36.8 C)] 96.8 F (36 C) (11/15 1815) Pulse Rate:  [58-99] 80  (11/15 1830) Resp:  [13-37] 29  (11/15 1830) BP: (68-190)/(37-90) 101/67 mmHg (11/15 1830) SpO2:  [95 %-100 %] 100 % (11/15 1830) Arterial Line BP: (72-148)/(41-75) 94/56 mmHg (11/15 1830) FiO2 (%):  [100 %] 100 % (11/15 1445) Weight:  [65.4 kg (144 lb 2.9 oz)] 65.4 kg (144 lb 2.9 oz) (11/15 2100)  Physical Examination: General:  Elderly woman lying in bed, intubated and sedated Neuro:  sedated HEENT:  PERRL, ETT in place Neck:  supple Cardiovascular:  NRRR, II/VI systolic murmur at LUSB Lungs:  Diminished in base, otherwise clear Abdomen:  Dressing in place Musculoskeletal:  Trace edema, no clubbine, pulses 2+ Skin:  No rashes or other lesions  Principal Problem:  *AAA (abdominal aortic aneurysm)   ASSESSMENT AND PLAN  PULMONARY  Lab 11/21/11 2006 11/21/11 1922 11/21/11 1738 11/21/11 1232 11/21/11 1115  PHART 7.366 7.267* 7.232* 7.293* 7.357  PCO2ART 38.9 50.0* 50.9* 47.0* 42.7  PO2ART 351.0* 336.0* 74.8* 432.0* 349.0*  HCO3 22.7 23.4 20.6 23.1 24.2*  O2SAT 100.0 100.0 96.2 100.0 100.0   Ventilator Settings: Vent Mode:  [-] PRVC FiO2 (%):  [100 %] 100 % Set Rate:  [14 bmp] 14 bmp Vt Set:  [510 mL] 510 mL PEEP:  [5 cmH20] 5 cmH20 Plateau Pressure:  [17 cmH20] 17 cmH20 CXR:  No infiltrates, Swann-Ganz in place ETT:  1cm above carina  A:  Remains intubated post operatively P:   PRVC 8cc/kg Wean FiO2 for sats >94% SBT in AM if meets criteria At risk for pulmonary edema/TRALI after significant blood product administration Repeat CXR in AM  CARDIOVASCULAR No results found for this basename: TROPONINI:5,LATICACIDVEN:5, O2SATVEN:5,PROBNP:5 in the last 168  hours ECG:  NSR Lines: PA catheter  A: S/p AAA repair with return to OR for post op bleeding P:  Swann in place, monitoring cardiac output Hypertensive on arrival to SICU - hydralizine PRN Improved with increased sedation  RENAL  Lab 11/21/11 2006 11/21/11 1922 11/21/11 1726 11/21/11 1617 11/21/11 1259  NA 143 142 140 142 140  K 3.7 4.4 -- -- --  CL -- -- 108 -- 108  CO2 -- -- 22 -- 23  BUN -- -- 12 -- 12  CREATININE -- -- 0.65 --  0.62  CALCIUM -- -- 7.0* -- 8.0*  MG -- -- -- -- 1.4*  PHOS -- -- -- -- --   Intake/Output      11/15 0701 - 11/16 0700   I.V. (mL/kg) 8800 (134.6)   Blood 5703   IV Piggyback 500   Total Intake(mL/kg) 15003 (229.4)   Urine (mL/kg/hr) 1175 (1.2)   Blood 2600   Total Output 3775   Net +11228        Foley:  In place  A:  At risk for electrolyte abnormalities in setting of transfusions P:   Monitor urine output Monitor electrolytes, replete K and Mag as needed  GASTROINTESTINAL No results found for this basename: AST:5,ALT:5,ALKPHOS:5,BILITOT:5,PROT:5,ALBUMIN:5 in the last 168 hours  A:  S/p AAA repair P:   NPO for now Diet per surgery  HEMATOLOGIC  Lab 11/21/11 2006 11/21/11 1922 11/21/11 1726 11/21/11 1617 11/21/11 1259  HGB 11.2* 12.2 10.0* 8.2* 11.7*  HCT 33.0* 36.0 30.1* 24.0* 35.5*  PLT -- -- 126* -- 136*  INR -- -- 1.33 -- 1.37  APTT -- -- 29 -- 29   A:  Anemia in setting of post op bleeding P:  Monitor H/H per surgery  INFECTIOUS  Lab 11/21/11 1726 11/21/11 1259  WBC 14.9* 16.4*  PROCALCITON -- --   Cultures: none Antibiotics: Per surgery  A:  No current signs or symptoms of infection P:   Monitor for signs and symptoms of infection  ENDOCRINE No results found for this basename: GLUCAP:5 in the last 168 hours A:  No current problems   P:   Monitor glucose post op  NEUROLOGIC  A:  Intubated P:   Sedate with propofol for Rass 0 to -2  BEST PRACTICE / DISPOSITION Level of Care:  ICU Primary  Service:  Surgery Consultants:  PCCM Code Status:  full Diet:  NPO DVT Px:  SCDs GI Px:  PPI Skin Integrity:  good Social / Family:  aware  GIDDINGS, Charlann Lange., M.D. Pulmonary and Critical Care Medicine [facility] Pager: [phone]  I spent 35 minutes of critical care time in the care of this patient separate from procedures which are documented elsewhere.   11/21/2011, 10:12 PM

## 2011-11-21 NOTE — Progress Notes (Signed)
Extubated to 4 l [place] by Dr Michelle Piper with RT present. Pt tolerated procedure well. O2 sats  100%

## 2011-11-21 NOTE — Op Note (Signed)
Vascular Surgery Progress Note  Subjective: Patient extubated in PACU. Was ready for transfer to 2300 and suddenly dropped systolic blood pressure from 120 systolic to 70. Again tachycardic at 100-110-sinus tach. Patient was alert and oriented x3. Did not complain of severe pain. Hemoglobin checked was 8 g compared to 11 g initially postop. Given 2 units of packed red blood cells and 2 units of fresh frozen plasma over 25 minute period with improvement in systolic blood pressure to 130/70 and heart rate decreased to 80s. The patient remains alert and oriented with no specific complaints other than nausea  Objective:  Filed Vitals:   11/21/11 1700  BP: 113/66  Pulse: 99  Temp: 98.2 F (36.8 C)  Resp: 24    Alert and oriented x3 Abdomen slightly distended appropriate tenderness Extremities 3+ femoral pulses Excellent urinary output 200 cc passed our   Labs:  Lab 11/21/11 1259  CREATININE 0.62    Lab 11/21/11 1617 11/21/11 1259 11/21/11 1232  NA 142 140 141  K 5.0 4.4 4.4  CL -- 108 --  CO2 -- 23 --  BUN -- 12 --  CREATININE -- 0.62 --  LABGLOM -- -- --  GLUCOSE 267* -- --  CALCIUM -- 8.0* --    Lab 11/21/11 1617 11/21/11 1259 11/21/11 1232  WBC -- 16.4* --  HGB 8.2* 11.7* 11.6*  HCT 24.0* 35.5* 34.0*  PLT -- 136* --    Lab 11/21/11 1259  INR 1.37       Imaging: Dg Chest 2 View  11/21/2011  *RADIOLOGY REPORT*  Clinical Data: Preop aneurysm repair.  CHEST - 2 VIEW  Comparison: 03/25/2011  Findings: Prior median sternotomy and valve replacement. There is hyperinflation of the lungs compatible with COPD.  Heart is normal size.  Tortuosity of the thoracic aorta.  Lungs are clear.  No effusions.  Old right rib fractures noted.  IMPRESSION: COPD.  No active disease.   Original Report Authenticated By: Charlett Nose, M.D.    Dg Chest Portable 1 View  11/21/2011  *RADIOLOGY REPORT*  Clinical Data: Postop from abdominal aortic aneurysm repair. Central line placement.   PORTABLE CHEST - 1 VIEW  Comparison: 11/21/2011  Findings: Right jugular Swan-Ganz catheter is seen with tip in the right pulmonary artery.  Endotracheal tube and nasogastric tube are in appropriate position.  No pneumothorax identified.  Low lung volumes are seen however both lungs remain grossly clear. Heart size is stable.  Previous aortic valve replacement again noted.  Old right rib fracture deformities again demonstrated.  IMPRESSION: Postoperative chest.  No evidence of pneumothorax or other acute findings.   Original Report Authenticated By: Myles Rosenthal, M.D.    Dg Abd Portable 1v  11/21/2011  *RADIOLOGY REPORT*  Clinical Data: Postop.  Abdominal aortic aneurysm repair.  PORTABLE ABDOMEN - 1 VIEW  Comparison: None.  Findings: The bowel gas pattern is normal.  Midline skin staples are seen.  Nasogastric tip is seen in the distal stomach.  IMPRESSION: No acute findings.  Nasogastric tip in appropriate position in distal stomach.   Original Report Authenticated By: Myles Rosenthal, M.D.     Assessment/Plan:  POD #0  LOS: 0 days  s/p Procedure(s): ANEURYSM ABDOMINAL AORTIC REPAIR PATCH ANGIOPLASTY-left iliac vein  Doing well post further transfusion in PACU with hemoglobin now 10.2 g at 5 PM Will observe for an additional 15-20 minutes and PACU and then transferred to 2300 suspect patient had a calibration of hemoglobin from surgery causing sudden drop in blood pressure.  We'll continue to observe    Josephina Gip, MD 11/21/2011 5:13 PM

## 2011-11-21 NOTE — Anesthesia Preprocedure Evaluation (Signed)
Anesthesia Evaluation  Patient identified by MRN, date of birth, ID band Patient awake    Reviewed: Allergy & Precautions, H&P , NPO status , Patient's Chart, lab work & pertinent test results  Airway Mallampati: I TM Distance: >3 FB Neck ROM: full    Dental   Pulmonary shortness of breath,          Cardiovascular hypertension, + dysrhythmias Atrial Fibrillation + Valvular Problems/Murmurs Rhythm:irregular Rate:Normal     Neuro/Psych    GI/Hepatic GERD-  ,  Endo/Other    Renal/GU      Musculoskeletal   Abdominal   Peds  Hematology   Anesthesia Other Findings   Reproductive/Obstetrics                           Anesthesia Physical Anesthesia Plan  ASA: IV and emergent  Anesthesia Plan: General ETT   Post-op Pain Management:    Induction:   Airway Management Planned:   Additional Equipment:   Intra-op Plan:   Post-operative Plan:   Informed Consent:   Only emergency history available  Plan Discussed with: CRNA and Surgeon  Anesthesia Plan Comments:         Anesthesia Quick Evaluation

## 2011-11-21 NOTE — Preoperative (Signed)
Beta Blockers   Reason not to administer Beta Blockers:Not Applicable 

## 2011-11-21 NOTE — Anesthesia Postprocedure Evaluation (Signed)
Anesthesia Post Note  Patient: Lauren Morrow  Procedure(s) Performed: Procedure(s) (LRB): ANEURYSM ABDOMINAL AORTIC REPAIR (N/A) PATCH ANGIOPLASTY (Right)  Anesthesia type: general  Patient location: PACU  Post pain: Pain level controlled  Post assessment: Patient's Cardiovascular Status Stable  Last Vitals:  Filed Vitals:   11/21/11 1500  BP: 99/48  Pulse: 65  Temp:   Resp: 17    Post vital signs: Reviewed and stable  Level of consciousness: sedated  Complications: No apparent anesthesia complications

## 2011-11-21 NOTE — Op Note (Signed)
OPERATIVE REPORT  Date of Surgery: 11/21/2011  Surgeon: Josephina Gip, MD  Assistant: Dr. Leonides Sake, Lianne Cure PA  Pre-op Diagnosis: Status Post Resection and Grafting abdominal aortic aneursym, bleeding  Post-op Diagnosis: Same Procedure: Procedure(s): EXPLORATORY LAPAROTOMY with evacuation of intra-abdominal hematoma and ligation bleeding lumbar branch Anesthesia: General  EBL: Minimal  Complications: None  Procedure Details: Patient was returned to the operating room having undergone resection and grafting of infrarenal abdominal aortic aneurysm early in the day. After prepping and draping routine sterile manner the midline incision was reopened. There was a large amount of liquefied hematoma in the peritoneal cavity probably 2-3 units. There is also obvious collection of blood in the retroperitoneum. Sutures were removed and the retroperitoneal closure and this area was thoroughly explored. The proximal anastomosis had no bleeding. Distal anastomosis had no bleeding on the left side. There was some arterial bleeding in the posterior wall on the right side which may have occurred when we mobilized the branch to explore it. This was repaired with 5-0 Prolene suture. The main bleeding seemed to be coming from a lumbar branch on the patient's left side posteriorly where there was brisk arterial bleeding. This was controlled with a figure-of-eight of 2-0 silk suture. The repair of the left iliac vein was carefully inspected there was no bleeding coming from this area. There was some diffuse oozing from the pelvis. No other arterial bleeding or venous bleeding was noted specifically. This was examined for about 30 minutes and no evidence of any active bleeding noted in the diffuse ooze. The flow seal hemostatic agent was then applied to the retroperitoneal space and an old aneurysm closed over the graft with 3-0 Vicryl retroperitoneum reapproximated with 3-0 Vicryl linea alba with closed with  #1 PDS and skin with staples the patient then taken to the surgical intensive care unit on the ventilator   Josephina Gip, MD 11/21/2011 8:08 PM

## 2011-11-21 NOTE — Anesthesia Preprocedure Evaluation (Signed)
Anesthesia Evaluation  Patient identified by MRN, date of birth, ID band Patient awake    Reviewed: Allergy & Precautions, H&P , NPO status , Patient's Chart, lab work & pertinent test results  Airway Mallampati: I TM Distance: >3 FB Neck ROM: full    Dental   Pulmonary shortness of breath,          Cardiovascular hypertension, + dysrhythmias Atrial Fibrillation + Valvular Problems/Murmurs Rhythm:irregular Rate:Normal     Neuro/Psych    GI/Hepatic GERD-  ,  Endo/Other    Renal/GU Renal disease     Musculoskeletal   Abdominal   Peds  Hematology   Anesthesia Other Findings   Reproductive/Obstetrics                           Anesthesia Physical Anesthesia Plan  ASA: III  Anesthesia Plan: General   Post-op Pain Management:    Induction: Intravenous  Airway Management Planned: Oral ETT  Additional Equipment: Arterial line, CVP and PA Cath  Intra-op Plan:   Post-operative Plan: Post-operative intubation/ventilation and Possible Post-op intubation/ventilation  Informed Consent: I have reviewed the patients History and Physical, chart, labs and discussed the procedure including the risks, benefits and alternatives for the proposed anesthesia with the patient or authorized representative who has indicated his/her understanding and acceptance.     Plan Discussed with: CRNA, Anesthesiologist and Surgeon  Anesthesia Plan Comments:         Anesthesia Quick Evaluation

## 2011-11-21 NOTE — Interval H&P Note (Signed)
History and Physical Interval Note:  11/21/2011 7:28 AM  Lauren Morrow  has presented today for surgery, with the diagnosis of AAA  The various methods of treatment have been discussed with the patient and family. After consideration of risks, benefits and other options for treatment, the patient has consented to  Procedure(s) (LRB) with comments: ANEURYSM ABDOMINAL AORTIC REPAIR (N/A) as a surgical intervention .  The patient's history has been reviewed, patient examined, no change in status, stable for surgery.  I have reviewed the patient's chart and labs.  Questions were answered to the patient's satisfaction.     Josephina Gip

## 2011-11-21 NOTE — Transfer of Care (Signed)
Immediate Anesthesia Transfer of Care Note  Patient: Lauren Morrow  Procedure(s) Performed: Procedure(s) (LRB) with comments: ANEURYSM ABDOMINAL AORTIC REPAIR (N/A) - using 16 x 8 mm x 40 cm hemashield graft PATCH ANGIOPLASTY (Right) - Iliac vein  Patient Location: PACU  Anesthesia Type:General  Level of Consciousness: unresponsive  Airway & Oxygen Therapy: Patient remains intubated per anesthesia plan  Post-op Assessment: Report given to PACU RN  Post vital signs: Reviewed and stable  Complications: No apparent anesthesia complications

## 2011-11-21 NOTE — Transfer of Care (Signed)
Immediate Anesthesia Transfer of Care Note  Patient: Lauren Morrow  Procedure(s) Performed: Procedure(s) (LRB) with comments: EXPLORATORY LAPAROTOMY (N/A) - exploratory laparotomy, evacuation of hematoma, suture ligation of lumbar arterial branch   Patient Location: SICU  Anesthesia Type:General  Level of Consciousness: sedated  Airway & Oxygen Therapy: Patient remains intubated per anesthesia plan and Patient placed on Ventilator (see vital sign flow sheet for setting)  Post-op Assessment: Report given to PACU RN and Post -op Vital signs reviewed and stable  Post vital signs: Reviewed and stable  Complications: No apparent anesthesia complications

## 2011-11-21 NOTE — Anesthesia Postprocedure Evaluation (Signed)
  Anesthesia Post-op Note  Patient: Lauren Morrow  Procedure(s) Performed: Procedure(s) (LRB) with comments: EXPLORATORY LAPAROTOMY (N/A) - exploratory laparotomy, evacuation of hematoma, suture ligation of lumbar arterial branch   Patient Location: SICU  Anesthesia Type:General  Level of Consciousness: sedated and Patient remains intubated per anesthesia plan  Airway and Oxygen Therapy: Patient connected to T-piece oxygen  Post-op Pain: mild  Post-op Assessment: Patient's Cardiovascular Status Stable  Post-op Vital Signs: stable  Complications: No apparent anesthesia complications

## 2011-11-21 NOTE — Progress Notes (Signed)
Dr. Imogene Burn notified with pts C.I./C.O.  No orders received.  I will continue to monitor.

## 2011-11-22 ENCOUNTER — Inpatient Hospital Stay (HOSPITAL_COMMUNITY): Payer: Medicare Other

## 2011-11-22 DIAGNOSIS — Z9889 Other specified postprocedural states: Secondary | ICD-10-CM

## 2011-11-22 DIAGNOSIS — E8779 Other fluid overload: Secondary | ICD-10-CM

## 2011-11-22 DIAGNOSIS — I959 Hypotension, unspecified: Secondary | ICD-10-CM | POA: Diagnosis present

## 2011-11-22 DIAGNOSIS — J95821 Acute postprocedural respiratory failure: Secondary | ICD-10-CM | POA: Diagnosis present

## 2011-11-22 DIAGNOSIS — D6959 Other secondary thrombocytopenia: Secondary | ICD-10-CM | POA: Diagnosis present

## 2011-11-22 DIAGNOSIS — D62 Acute posthemorrhagic anemia: Secondary | ICD-10-CM | POA: Diagnosis present

## 2011-11-22 DIAGNOSIS — IMO0002 Reserved for concepts with insufficient information to code with codable children: Secondary | ICD-10-CM

## 2011-11-22 DIAGNOSIS — Z8679 Personal history of other diseases of the circulatory system: Secondary | ICD-10-CM

## 2011-11-22 LAB — PREPARE FRESH FROZEN PLASMA
Unit division: 0
Unit division: 0
Unit division: 0
Unit division: 0
Unit division: 0
Unit division: 0
Unit division: 0

## 2011-11-22 LAB — PREPARE PLATELET PHERESIS

## 2011-11-22 LAB — POCT I-STAT 3, ART BLOOD GAS (G3+)
Acid-base deficit: 5 mmol/L — ABNORMAL HIGH (ref 0.0–2.0)
Acid-base deficit: 5 mmol/L — ABNORMAL HIGH (ref 0.0–2.0)
Bicarbonate: 21.9 mEq/L (ref 20.0–24.0)
Bicarbonate: 20.2 mEq/L (ref 20.0–24.0)
O2 Saturation: 96 %
O2 Saturation: 96 %
Patient temperature: 98.4
Patient temperature: 37.7
TCO2: 23 mmol/L (ref 0–100)
TCO2: 21 mmol/L (ref 0–100)
pH, Arterial: 7.269 — ABNORMAL LOW (ref 7.350–7.450)

## 2011-11-22 LAB — AMYLASE: Amylase: 79 U/L (ref 0–105)

## 2011-11-22 LAB — CBC
HCT: 38.4 % (ref 36.0–46.0)
Hemoglobin: 13.7 g/dL (ref 12.0–15.0)
Hemoglobin: 13.8 g/dL (ref 12.0–15.0)
MCH: 30.3 pg (ref 26.0–34.0)
MCH: 29.2 pg (ref 26.0–34.0)
MCHC: 35.7 g/dL (ref 30.0–36.0)
MCV: 85 fL (ref 78.0–100.0)
Platelets: 101 10*3/uL — ABNORMAL LOW (ref 150–400)
RBC: 4.73 MIL/uL (ref 3.87–5.11)

## 2011-11-22 LAB — COMPREHENSIVE METABOLIC PANEL
BUN: 13 mg/dL (ref 6–23)
Calcium: 6.4 mg/dL — CL (ref 8.4–10.5)
Creatinine, Ser: 0.6 mg/dL (ref 0.50–1.10)
GFR calc Af Amer: >90 mL/min (ref >90)
GFR calc non Af Amer: 81 mL/min — ABNORMAL LOW (ref >90)
Glucose, Bld: 175 mg/dL — ABNORMAL HIGH (ref 70–99)
Sodium: 138 mEq/L (ref 135–145)
Total Protein: 4.3 g/dL — ABNORMAL LOW (ref 6.0–8.3)

## 2011-11-22 LAB — GLUCOSE, CAPILLARY
Glucose-Capillary: 106 mg/dL — ABNORMAL HIGH (ref 70–99)
Glucose-Capillary: 105 mg/dL — ABNORMAL HIGH (ref 70–99)

## 2011-11-22 LAB — MAGNESIUM: Magnesium: 1.7 mg/dL (ref 1.5–2.5)

## 2011-11-22 LAB — TROPONIN I: Troponin I: <0.3 ng/mL (ref <0.30)

## 2011-11-22 MED ORDER — METOPROLOL TARTRATE 1 MG/ML IV SOLN
2.0000 mg | INTRAVENOUS | Status: AC | PRN
  Administered 2011-11-23 (×2): 5 mg via INTRAVENOUS
  Filled 2011-11-22 (×2): qty 5

## 2011-11-22 MED ORDER — DOPAMINE-DEXTROSE 3.2-5 MG/ML-% IV SOLN
INTRAVENOUS | Status: AC
  Filled 2011-11-22: qty 250

## 2011-11-22 MED ORDER — DOPAMINE-DEXTROSE 3.2-5 MG/ML-% IV SOLN
2.0000 ug/kg/min | INTRAVENOUS | Status: DC
  Administered 2011-11-22: 3 ug/kg/min via INTRAVENOUS
  Filled 2011-11-22: qty 250

## 2011-11-22 MED ORDER — LACTATED RINGERS IV SOLN: INTRAVENOUS | Status: DC

## 2011-11-22 MED ORDER — FENTANYL CITRATE 0.05 MG/ML IJ SOLN
25.0000 ug | INTRAMUSCULAR | Status: DC | PRN
  Administered 2011-11-22 (×2): 50 ug via INTRAVENOUS
  Administered 2011-11-23: 25 ug via INTRAVENOUS
  Filled 2011-11-22 (×2): qty 2

## 2011-11-22 MED ORDER — LACTATED RINGERS IV BOLUS (SEPSIS)
500.0000 mL | Freq: Once | INTRAVENOUS | Status: AC
  Administered 2011-11-22: 500 mL via INTRAVENOUS

## 2011-11-22 MED ORDER — SODIUM CHLORIDE 0.9 % IV SOLN
1.0000 g | Freq: Once | INTRAVENOUS | Status: AC
  Administered 2011-11-22: 1 g via INTRAVENOUS
  Filled 2011-11-22: qty 10

## 2011-11-22 NOTE — Progress Notes (Signed)
PT Cancellation Note  Patient Details Name: LOTTI ANDLER MRN: [id number] DOB: 1924-08-26   Cancelled Treatment:    Reason Eval/Treat Not Completed: Medical issues which prohibited therapy;Patient not medically ready Will try back. 11/23/11. 11/22/2011  Salem Bing, PT [phone] (pager)    Aspen Lawrance, Eliseo Gum 11/22/2011, 12:40 PM

## 2011-11-22 NOTE — Progress Notes (Signed)
eLink Physician-Brief Progress Note Patient Name: Lauren Morrow DOB: [birth year] MRN: [id number]  Date of Service  11/22/2011   HPI/Events of Note  Hypocalcemia   eICU Interventions  Repalced      [facility] 11/22/2011, 5:50 AM

## 2011-11-22 NOTE — Progress Notes (Addendum)
[facility] [place]  Daily Progress Note  Assessment/Planning: POD #1 s/p Aortobi-iliac bypass, x-lap and ligation of lumbar artery and evacuation of retroperitoneal hematoma   NEURO: cont. Sedation for vent.  PULM: surprisingly clean lungs on xray but given hemodynamic lability and expected fluid shifts would not be surprised if pt needs to remain on vent for a few day, vent mgmt per PCCM.  No ventilation issues to suggest abd. compartment syndrome at this point.  Cardiac: labile BP, no pressors needed yet; cont . SG catheter to help guide resuscitation  GI: cont NGT, NPO for now; will need to watch for an abdominal compartment syndrome given significant L retroperitoneal hematoma still present which may cause fluid shift into abdomen.  FEN: electrolyte mgmt per protocol, NPO for now  RENAL: UOP acceptable, but some oliguria would not be unexpected given a suprarenal clamp was needed for this juxtarenal AAA  HEME: stable H/H, platelet low but no obvious evidence of active bleeding so I merely observe at this time  ID: finish post-prophylactic abx  Overall: cont in SICU, better post-op course than I anticipated.  Expect significant volume shifts over the next few days.  Appreciate PCCM's help.  Subjective  - 1 Day Post-Op  Sedated, no events overnight  Objective Filed Vitals:   11/22/11 0423 11/22/11 0500 11/22/11 0600 11/22/11 0700  BP:  86/53 103/60 100/59  Pulse: 78 76 79 81  Temp: 98.8 F (37.1 C) 98.6 F (37 C) 99.3 F (37.4 C) 99.9 F (37.7 C)  TempSrc:   Core (Comment)   Resp: 18 18 18 18   Height:      Weight:      SpO2: 100% 99% 100% 99%    Intake/Output Summary (Last 24 hours) at 11/22/11 0804 Last data filed at 11/22/11 0700  Gross per 24 hour  Intake 16437.3 ml  Output   4170 ml  Net 12267.3 ml   NEURO sedated, responsive to touch PULM  BLL rales, no rhonchi CV  RRR GI  soft, distended, appropriate grimace to palp., -G/R,  bandaged REN UOP > 30 cc/hr VASC  Dopplerable B PT (R>L), somewhat difficult B femoral pulses to palpate due to pannus  Laboratory CBC    Component Value Date/Time   WBC 9.9 11/22/2011 0400   HGB 13.7 11/22/2011 0400   HCT 38.4 11/22/2011 0400   PLT 93* 11/22/2011 0400    BMET    Component Value Date/Time   NA 138 11/22/2011 0400   K 4.3 11/22/2011 0400   CL 108 11/22/2011 0400   CO2 22 11/22/2011 0400   GLUCOSE 175* 11/22/2011 0400   BUN 13 11/22/2011 0400   CREATININE 0.60 11/22/2011 0400   CREATININE 0.60 10/23/2011 1030   CALCIUM 6.4* 11/22/2011 0400   GFRNONAA 81* 11/22/2011 0400   GFRAA >90 11/22/2011 0400    Leonides Sake, MD [facility] [place] Office: [phone] Pager: [phone]  11/22/2011, 8:04 AM

## 2011-11-22 NOTE — Progress Notes (Signed)
CRITICAL VALUE ALERT  Critical value received:  Calcium 6.4  Date of notification:  11/22/2011  Time of notification:  0600  Critical value read back:yes  Nurse who received alert:  Burna Cash RN  MD notified (1st page):  Dr. Frederico Hamman  Time of first page:  0615  MD notified (2nd page):  Time of second page:  Responding MD:    Time MD responded:

## 2011-11-22 NOTE — Progress Notes (Signed)
[facility] [place]  Soft BP, responsive to 500 cc bolus.  Given significant blood volumes given, I am concerned for overloading her.  I suspect the oliguria that is developing is also related to relative hypoperfusion of her kidneys.  Given the cross clamp time from the operation, I am starting some dopamine to get better perfusion.  I suspect once the propofol is stopped once she is extubated (likely tomorrow) the relative hypotension will likely resolve.  Check another CBC at 1800.  No evidence currently of acute bleeding.  Leonides Sake, MD [facility] [place] Office: [phone] Pager: [phone]  11/22/2011, 3:13 PM

## 2011-11-22 NOTE — Progress Notes (Signed)
INITIAL ADULT NUTRITION ASSESSMENT Date: 11/22/2011   Time: 10:49 AM Reason for Assessment: vent  INTERVENTION: 1.  Enteral nutrition; If warranted by MD, recommend initiation of Osmolite 1.2 @ 10 mL/hr continuous.  Advance by 10 mL q 6 hrs to 40 mL/hr goal with Prostat BID to provide 1454 kcal, 83g protein, 787 mL free water.   DOCUMENTATION CODES Per approved criteria  -Not Applicable    ASSESSMENT: Female 78 y.o.  Dx: AAA (abdominal aortic aneurysm)  Hx:  Past Medical History  Diagnosis Date  . Varicose veins   . Seasonal allergies   . Osteomyelitis     history of osteomyelistis in the leg  . Aortic stenosis     Dr. Terrilee Files, saw last 09/11/11  . Dizziness   . Peripheral vascular disease   . Hypertension     sees Dr. Sherril Croon, primary  . Atrial fibrillation     Post op, sees  Dr. Kirke Corin  . Anxiety   . Shortness of breath     "gets short of breath at time since the heart surgery 2012"  . Urinary tract infection     hx of  . Kidney stones     hx of  . GERD (gastroesophageal reflux disease)   . Arthritis   . Anemia     hx of  . Low back pain   . Cataracts, bilateral    Past Surgical History  Procedure Date  . Spine surgery 1967  1976    dr. Ollen Bowl  . Aortic valve replacement 11/22/2010    21mm pericardial valve serial#[id number], model [id number]  . Cardiac catheterization   . Aortic valve replacement     In 2012 with a bioprosthetic valve  . Bladder tact     1960's    Related Meds:  Scheduled Meds:   . antiseptic oral rinse  15 mL Mouth Rinse QID  . [COMPLETED] calcium gluconate  1 g Intravenous Once  . cefUROXime (ZINACEF)  IV  1.5 g Intravenous Q12H  . chlorhexidine  15 mL Mouth Rinse BID  . [EXPIRED] dextrose 5 % and 0.45 % NaCl with KCl 20 mEq/L      . [COMPLETED] lactated ringers  500 mL Intravenous Once  . pantoprazole (PROTONIX) IV  40 mg Intravenous Daily  . [EXPIRED] sodium bicarbonate      . [COMPLETED] sodium bicarbonate  50 mEq Intravenous  Once  . [DISCONTINUED] aspirin EC  325 mg Oral Daily  . [DISCONTINUED] b complex vitamins  1 tablet Oral Daily  . [DISCONTINUED] B-complex with vitamin C  1 tablet Oral Daily  . [DISCONTINUED] calcium-vitamin D  1 tablet Oral BID  . [DISCONTINUED] cholecalciferol  1,000 Units Oral Daily  . [DISCONTINUED] docusate sodium  100 mg Oral Daily  . [DISCONTINUED] fish oil-omega-3 fatty acids  1 g Oral Daily  . [DISCONTINUED] metoprolol tartrate  25 mg Oral BID  . [DISCONTINUED] multivitamin  1 tablet Oral Daily  . [DISCONTINUED] multivitamin with minerals  1 tablet Oral Daily  . [DISCONTINUED] omega-3 acid ethyl esters  1 g Oral Daily  . [DISCONTINUED] pantoprazole  40 mg Oral Daily  . [DISCONTINUED] pantoprazole  40 mg Oral Daily  . [DISCONTINUED] sertraline  50 mg Oral Daily  . [DISCONTINUED] vitamin C  500 mg Oral Daily  . [DISCONTINUED] Vitamin D  1,000 Units Oral Daily   Continuous Infusions:   . dextrose 5 % and 0.45 % NaCl with KCl 20 mEq/L 100 mL/hr at 11/22/11 0400  . lactated  ringers 20 mL/hr at 11/22/11 0400  . propofol 10 mcg/kg/min (11/22/11 1025)  . [DISCONTINUED] sodium chloride    . [DISCONTINUED] DOPamine     PRN Meds:.[COMPLETED] sodium chloride, acetaminophen, acetaminophen, alum & mag hydroxide-simeth, bisacodyl, fentaNYL, hydrALAZINE, labetalol, [COMPLETED] magnesium sulfate 1 - 4 g bolus IVPB, metoprolol, ondansetron, phenol, [EXPIRED] potassium chloride, senna-docusate, [DISCONTINUED] 0.9 % irrigation (POUR BTL), [DISCONTINUED] 0.9 % irrigation (POUR BTL), [DISCONTINUED] diphenhydrAMINE, [DISCONTINUED] guaiFENesin-dextromethorphan [DISCONTINUED] hemostatic agents, [DISCONTINUED] heparin 6000 unit irrigation, [DISCONTINUED] heparin 6000 unit irrigation, [DISCONTINUED]  HYDROmorphone (DILAUDID) injection, [DISCONTINUED] meclizine, [DISCONTINUED] metoprolol, [DISCONTINUED]  morphine injection, [DISCONTINUED] ondansetron (ZOFRAN) IV, [DISCONTINUED]  oxyCODONE-acetaminophen   Ht: 5\' 8"  (172.7 cm)  Wt: 144 lb 6.4 oz (65.5 kg)  Ideal Wt: 63.6 kg % Ideal Wt: 102%  Usual Wt: 143-146 lbs x1 yr per chart review % Usual Wt: 100%  Body mass index is 21.96 kg/(m^2).  Food/Nutrition Related Hx: unable to assess, surgery was planned  Labs:  CMP     Component Value Date/Time   NA 138 11/22/2011 0400   K 4.3 11/22/2011 0400   CL 108 11/22/2011 0400   CO2 22 11/22/2011 0400   GLUCOSE 175* 11/22/2011 0400   BUN 13 11/22/2011 0400   CREATININE 0.60 11/22/2011 0400   CREATININE 0.60 10/23/2011 1030   CALCIUM 6.4* 11/22/2011 0400   PROT 4.3* 11/22/2011 0400   ALBUMIN 2.2* 11/22/2011 0400   AST 38* 11/22/2011 0400   ALT 18 11/22/2011 0400   ALKPHOS 31* 11/22/2011 0400   BILITOT 0.5 11/22/2011 0400   GFRNONAA 81* 11/22/2011 0400   GFRAA >90 11/22/2011 0400    CBC    Component Value Date/Time   WBC 9.9 11/22/2011 0400   RBC 4.52 11/22/2011 0400   HGB 13.7 11/22/2011 0400   HCT 38.4 11/22/2011 0400   PLT 93* 11/22/2011 0400   MCV 85.0 11/22/2011 0400   MCH 30.3 11/22/2011 0400   MCHC 35.7 11/22/2011 0400   RDW 16.0* 11/22/2011 0400    Intake: NPO Output:   Intake/Output Summary (Last 24 hours) at 11/22/11 1053 Last data filed at 11/22/11 1027  Gross per 24 hour  Intake 14092.8 ml  Output   2795 ml  Net 11297.8 ml   Last BM was PTA  Diet Order: NPO  Supplements/Tube Feeding: none at this time  IVF:    dextrose 5 % and 0.45 % NaCl with KCl 20 mEq/L Last Rate: 100 mL/hr at 11/22/11 0400  lactated ringers Last Rate: 20 mL/hr at 11/22/11 0400  propofol Last Rate: 10 mcg/kg/min (11/22/11 1025)  [DISCONTINUED] sodium chloride   [DISCONTINUED] DOPamine     Estimated Nutritional Needs:   Kcal: 1472 Protein: 85-97g Fluid: >1.9 L/day  Pt admitted for planned AAA repair.  Post-op course complicated by peritoneal bleed that required additional repair.   Patient is currently intubated on ventilator support.  MV:  7.6 L/min Temp:Temp (24hrs), Avg:98.2 F (36.8 C), Min:93.6 F (34.2 C), Max:100.4 F (38 C)  Propofol: 3.9 ml/hr which provides 102 kcal/day  RN expects pt to remain intubated.  Recent adjustments made to propofol- rate decreased.   NUTRITION DIAGNOSIS: -Inadequate oral intake (NI-2.1).  Status: Ongoing  RELATED TO: mechanical ventilation  AS EVIDENCE BY: pt intubated, NPO  MONITORING/EVALUATION(Goals): 1.  Enteral nutrition; initiation with tolerance if pt to remain intubated >24 hrs.  EDUCATION NEEDS: -Education not appropriate at this time    Loyce Dys, MS RD LDN Clinical Inpatient Dietitian Pager: [phone] Weekend/After hours pager: [phone]

## 2011-11-22 NOTE — Progress Notes (Signed)
Name: Lauren Morrow MRN: [id number] DOB: 10/24/1924    LOS: 1  Referring Provider:  Dr. Hart Rochester Reason for Referral:  Ventilator management post op  PULMONARY / CRITICAL CARE MEDICINE  Profile: 78 y/o woman who underwent AAA repair 11/15 complicated by peritoneal bleed after surgery requiring return to the OR for repair. She remained intubated post-operatively for airway protection.    Events/Studies:   Current Status: RASS -2 on propofol. BP labile  Vital Signs: Temp:  [93.6 F (34.2 C)-100.8 F (38.2 C)] 100.8 F (38.2 C) (11/16 1400) Pulse Rate:  [62-99] 92  (11/16 1400) Resp:  [14-37] 15  (11/16 1400) BP: (67-143)/(37-87) 92/55 mmHg (11/16 1400) SpO2:  [94 %-100 %] 94 % (11/16 1400) Arterial Line BP: (72-167)/(41-82) 98/54 mmHg (11/16 1400) FiO2 (%):  [40 %-50 %] 40 % (11/16 1143) Weight:  [65.4 kg (144 lb 2.9 oz)-65.5 kg (144 lb 6.4 oz)] 65.5 kg (144 lb 6.4 oz) (11/16 0400)  Physical Examination: General: intubated and sedated Neuro:  sedated HEENT:  PERRL, ETT in place Neck:  supple Cardiovascular:  NRRR, II/VI systolic murmur at LUSB Lungs:  Diminished in base, otherwise clear Abdomen:  Dressing in place Musculoskeletal:  Trace edema, no clubbine, pulses 2+ Skin:  No rashes or other lesions  BMET    Component Value Date/Time   NA 138 11/22/2011 0400   K 4.3 11/22/2011 0400   CL 108 11/22/2011 0400   CO2 22 11/22/2011 0400   GLUCOSE 175* 11/22/2011 0400   BUN 13 11/22/2011 0400   CREATININE 0.60 11/22/2011 0400   CREATININE 0.60 10/23/2011 1030   CALCIUM 6.4* 11/22/2011 0400   GFRNONAA 81* 11/22/2011 0400   GFRAA >90 11/22/2011 0400    CBC    Component Value Date/Time   WBC 9.9 11/22/2011 0400   RBC 4.52 11/22/2011 0400   HGB 13.7 11/22/2011 0400   HCT 38.4 11/22/2011 0400   PLT 93* 11/22/2011 0400   MCV 85.0 11/22/2011 0400   MCH 30.3 11/22/2011 0400   MCHC 35.7 11/22/2011 0400   RDW 16.0* 11/22/2011 0400   ABG    Component Value  Date/Time   PHART 7.335* 11/22/2011 0637   PCO2ART 38.2 11/22/2011 0637   PO2ART 88.0 11/22/2011 0637   HCO3 20.2 11/22/2011 0637   TCO2 21 11/22/2011 0637   ACIDBASEDEF 5.0* 11/22/2011 0637   O2SAT 96.0 11/22/2011 0637   CXR: NACPD    Principal Problem:  *S/P AAA repair with re-exploration for post op bleeding  No analgesia presently Active Problems:  Respiratory failure, post-operative   remains a little too physiologically unstable to wean  Acute blood loss anemia  No evidence of ongoing bleeding  No indication for RBCs presently  Thrombocytopenia due to blood loss  No indication for platelets presently  Hypotension, likely sedation related (propofol)   PLAN Cont full vent Fentanyl added for pain mgmt Try to reduce propofol NS bolus given Monitor CBC Daily WUA/SBT when indicated  35 mins CCM time  Billy Fischer, MD ; [facility] service Mobile [phone].  After 5:30 PM or weekends, call [phone]

## 2011-11-23 ENCOUNTER — Inpatient Hospital Stay (HOSPITAL_COMMUNITY): Payer: Medicare Other

## 2011-11-23 DIAGNOSIS — E8770 Fluid overload, unspecified: Secondary | ICD-10-CM | POA: Diagnosis not present

## 2011-11-23 DIAGNOSIS — E876 Hypokalemia: Secondary | ICD-10-CM | POA: Diagnosis not present

## 2011-11-23 DIAGNOSIS — E8779 Other fluid overload: Secondary | ICD-10-CM

## 2011-11-23 DIAGNOSIS — J95821 Acute postprocedural respiratory failure: Secondary | ICD-10-CM

## 2011-11-23 DIAGNOSIS — D6959 Other secondary thrombocytopenia: Secondary | ICD-10-CM

## 2011-11-23 DIAGNOSIS — Z9889 Other specified postprocedural states: Secondary | ICD-10-CM

## 2011-11-23 LAB — TYPE AND SCREEN
ABO/RH(D): A POS
Antibody Screen: NEGATIVE
Unit division: 0
Unit division: 0
Unit division: 0
Unit division: 0
Unit division: 0
Unit division: 0
Unit division: 0
Unit division: 0
Unit division: 0
Unit division: 0
Unit division: 0
Unit division: 0
Unit division: 0
Unit division: 0
Unit division: 0
Unit division: 0
Unit division: 0
Unit division: 0
Unit division: 0
Unit division: 0
Unit division: 0

## 2011-11-23 LAB — POCT I-STAT 3, ART BLOOD GAS (G3+)
pCO2 arterial: 39.7 mmHg (ref 35.0–45.0)
pH, Arterial: 7.384 (ref 7.350–7.450)

## 2011-11-23 LAB — CBC
Platelets: 82 10*3/uL — ABNORMAL LOW (ref 150–400)
RBC: 4.07 MIL/uL (ref 3.87–5.11)
RDW: 16.3 % — ABNORMAL HIGH (ref 11.5–15.5)
WBC: 17 10*3/uL — ABNORMAL HIGH (ref 4.0–10.5)

## 2011-11-23 LAB — BASIC METABOLIC PANEL
CO2: 24 mEq/L (ref 19–32)
Calcium: 7.7 mg/dL — ABNORMAL LOW (ref 8.4–10.5)
Chloride: 106 mEq/L (ref 96–112)
Creatinine, Ser: 0.73 mg/dL (ref 0.50–1.10)
GFR calc Af Amer: 88 mL/min — ABNORMAL LOW (ref >90)
Sodium: 140 mEq/L (ref 135–145)

## 2011-11-23 LAB — TROPONIN I
Troponin I: <0.3 ng/mL (ref <0.30)
Troponin I: <0.3 ng/mL (ref <0.30)

## 2011-11-23 MED ORDER — POTASSIUM CHLORIDE 10 MEQ/50ML IV SOLN
INTRAVENOUS | Status: AC
  Filled 2011-11-23: qty 300

## 2011-11-23 MED ORDER — POTASSIUM CHLORIDE 10 MEQ/50ML IV SOLN
10.0000 meq | INTRAVENOUS | Status: AC
  Administered 2011-11-23 (×6): 10 meq via INTRAVENOUS

## 2011-11-23 MED ORDER — FENTANYL CITRATE 0.05 MG/ML IJ SOLN
25.0000 ug | INTRAMUSCULAR | Status: DC | PRN
  Filled 2011-11-23: qty 2

## 2011-11-23 MED ORDER — FUROSEMIDE 10 MG/ML IJ SOLN
40.0000 mg | Freq: Once | INTRAMUSCULAR | Status: AC
  Administered 2011-11-23: 40 mg via INTRAVENOUS

## 2011-11-23 NOTE — Progress Notes (Signed)
Orders received for Bipap by Dr. Sung Amabile. Increased work of breathing and RR. Bipap initiated at 14/6 with 50%. Will monitor and cycle on and off as tolerated.

## 2011-11-23 NOTE — Progress Notes (Addendum)
[facility] [place]  Daily Progress Note  Assessment/Planning: POD #2 s/p Aortobi-iliac bypass, Redo X-lap for bleeding   NEURO: sed. For vent, can d/c once extubated  PULM: minimal vent. Setting, pCXR demonstrates some ? RLL collapse vs ATX, will defer to PCCM extubation decision  CV: BP better with DA, suspect will normalize once propofol d/c; D/C SG cath  GI: appropriate exam, cont NGT and NPO  FEN: correct K and Mg per ICU protocol  RENAL: UOP improved with increased profusion and DA drip, Cr still ok; keep foley (necessary to continue monitoring kidney function)  HEME: drop in H/H likely false, no clinical evidence of bleeding  ID: suspect mild temp. Elevation due to atelectiasis  Subjective  - 2 Days Post-Op  No events  Objective Filed Vitals:   11/23/11 0400 11/23/11 0500 11/23/11 0600 11/23/11 0700  BP: 137/58 102/57 112/57 116/59  Pulse: 99 93 98 93  Temp: 100.4 F (38 C) 100.4 F (38 C) 100.4 F (38 C) 99.9 F (37.7 C)  TempSrc:      Resp: 16 15 15 15   Height:      Weight:  176 lb 5.9 oz (80 kg)    SpO2: 95% 93% 93% 94%    Intake/Output Summary (Last 24 hours) at 11/23/11 0807 Last data filed at 11/23/11 0600  Gross per 24 hour  Intake 1920.2 ml  Output   1395 ml  Net  525.2 ml   NEURO tracks, opens eyes PULM  RLL rales CV  RRR, CVP 12 GI  soft, distended, inc bandage, appropriate grimace to palpation, NGT 800 cc/24 hour RENAL 635 cc/24 hour, UOP 30 cc/hr over last 12 hours VASC  BLE dopplerable signals  Laboratory CBC    Component Value Date/Time   WBC 17.0* 11/23/2011 0300   HGB 12.0 11/23/2011 0300   HCT 34.3* 11/23/2011 0300   PLT 82* 11/23/2011 0300    BMET    Component Value Date/Time   NA 140 11/23/2011 0300   K 3.4* 11/23/2011 0300   CL 106 11/23/2011 0300   CO2 24 11/23/2011 0300   GLUCOSE 133* 11/23/2011 0300   BUN 18 11/23/2011 0300   CREATININE 0.73 11/23/2011 0300   CREATININE 0.60 10/23/2011  1030   CALCIUM 7.7* 11/23/2011 0300   GFRNONAA 76* 11/23/2011 0300   GFRAA 88* 11/23/2011 0300    Leonides Sake, MD [facility] [place] Office: [phone] Pager: [phone]  11/23/2011, 8:07 AM

## 2011-11-23 NOTE — Progress Notes (Signed)
Dr. Sung Amabile called re:  Increased RR to upper 40's with use of neck accessory muscles, pt alert will continue to monitor

## 2011-11-23 NOTE — Progress Notes (Signed)
Name: Lauren Morrow MRN: [id number] DOB: [birth year]    LOS: 2  Referring Provider:  Dr. Hart Rochester Reason for Referral:  Ventilator management post op  PULMONARY / CRITICAL CARE MEDICINE  Profile: 78 y/o woman who underwent AAA repair 11/15 complicated by peritoneal bleed after surgery requiring return to the OR for repair. She remained intubated post-operatively for airway protection.     Current Status: RASS +1. Passed SBT marginally. Indicates that she desperately wants ETT out. ADD: post extubation, tachypnea. Mild respiratory distress  Vital Signs: Temp:  [98.5 F (36.9 C)-101.1 F (38.4 C)] 98.5 F (36.9 C) (11/17 1200) Pulse Rate:  [88-121] 91  (11/17 1400) Resp:  [15-34] 25  (11/17 1400) BP: (92-152)/(53-72) 131/66 mmHg (11/17 1400) SpO2:  [90 %-97 %] 97 % (11/17 1400) Arterial Line BP: (71-152)/(38-78) 129/62 mmHg (11/17 1400) FiO2 (%):  [40 %-50 %] 50 % (11/17 1300) Weight:  [80 kg (176 lb 5.9 oz)] 80 kg (176 lb 5.9 oz) (11/17 0500)  Physical Examination: General: Mild resp distress, improved on BiPAP Neuro:  Cognition intact, no focal deficits HEENT:  WNL Neck:  No JVD Cardiovascular:  NRRR, II/VI systolic murmur at LUSB Lungs:  Clear anteriorly Abdomen:  Distended, firm but not rigid, mildly diffusely tender Musculoskeletal:  + edema, pulses 2+ Skin:  No rashes or other lesions  BMET    Component Value Date/Time   NA 140 11/23/2011 0300   K 3.4* 11/23/2011 0300   CL 106 11/23/2011 0300   CO2 24 11/23/2011 0300   GLUCOSE 133* 11/23/2011 0300   BUN 18 11/23/2011 0300   CREATININE 0.73 11/23/2011 0300   CREATININE 0.60 10/23/2011 1030   CALCIUM 7.7* 11/23/2011 0300   GFRNONAA 76* 11/23/2011 0300   GFRAA 88* 11/23/2011 0300    CBC    Component Value Date/Time   WBC 17.0* 11/23/2011 0300   RBC 4.07 11/23/2011 0300   HGB 12.0 11/23/2011 0300   HCT 34.3* 11/23/2011 0300   PLT 82* 11/23/2011 0300   MCV 84.3 11/23/2011 0300   MCH 29.5 11/23/2011  0300   MCHC 35.0 11/23/2011 0300   RDW 16.3* 11/23/2011 0300   ABG    Component Value Date/Time   PHART 7.384 11/23/2011 1057   PCO2ART 39.7 11/23/2011 1057   PO2ART 73.0* 11/23/2011 1057   HCO3 23.7 11/23/2011 1057   TCO2 25 11/23/2011 1057   ACIDBASEDEF 1.0 11/23/2011 1057   O2SAT 94.0 11/23/2011 1057   CXR:  Patchy bibasilar atx/infiltrates    Principal Problem:  *S/P AAA repair with re-exploration for post op bleeding   post op mgmt per [facility]  Active Problems:  Respiratory failure, post-operative   Extubated, marginal due to abdominal noncompliance. Better on BiPAP   Acute blood loss anemia  No evidence of ongoing bleeding  No indication for RBCs presently   Thrombocytopenia due to blood loss  No indication for platelets presently   Hypotension, resolved  Volume overload  Hypokalemia   PLAN Cont PRN NPPV Cont low dose Fentanyl for pain mgmt Lasix ordered Replete K+  Monitor CBC Timing of initiation of diet per surgery  35 mins CCM time  Billy Fischer, MD ; [facility] service Mobile [phone].  After 5:30 PM or weekends, call [phone]

## 2011-11-23 NOTE — Progress Notes (Signed)
PT Cancellation Note  Patient Details Name: Lauren Morrow MRN: [id number] DOB: 02-Mar-1924   Cancelled Treatment:    Reason Eval/Treat Not Completed: Medical issues which prohibited therapy   Noted still intubated post op  Will likely be working on extubation today  Will hold PT eval for today, and plan to follow up tomorrow (11/18)   Van Clines [place], [place] [phone]  11/23/2011, 8:11 AM

## 2011-11-23 NOTE — Procedures (Signed)
Extubation Procedure Note  Patient Details:   Name: MAHALAH MONTELLANO DOB: Oct 27, 1924 MRN: [id number]   Airway Documentation:     Evaluation  O2 sats: stable throughout Complications: No apparent complications Patient did tolerate procedure well. Bilateral Breath Sounds: Expiratory wheezes Suctioning: Airway Yes  Dr. Sung Amabile called for orders to extubate by nursing. ABG performed and with in parameter. Positive cuff leak. RSBI=65%   Newt Lukes 11/23/2011, 11:35 AM

## 2011-11-23 NOTE — Progress Notes (Signed)
RT placed PT back on BiPAP / NIV due to PT SOB and increased RR- PT tolerating well at this time- RN aware.

## 2011-11-24 ENCOUNTER — Encounter (HOSPITAL_COMMUNITY): Payer: Self-pay | Admitting: Vascular Surgery

## 2011-11-24 DIAGNOSIS — I10 Essential (primary) hypertension: Secondary | ICD-10-CM

## 2011-11-24 LAB — BASIC METABOLIC PANEL
BUN: 18 mg/dL (ref 6–23)
Calcium: 8.1 mg/dL — ABNORMAL LOW (ref 8.4–10.5)
GFR calc non Af Amer: 79 mL/min — ABNORMAL LOW (ref >90)
Glucose, Bld: 115 mg/dL — ABNORMAL HIGH (ref 70–99)
Sodium: 140 mEq/L (ref 135–145)

## 2011-11-24 LAB — CBC
Hemoglobin: 10.4 g/dL — ABNORMAL LOW (ref 12.0–15.0)
MCH: 30 pg (ref 26.0–34.0)
MCHC: 34.3 g/dL (ref 30.0–36.0)

## 2011-11-24 MED ORDER — POTASSIUM CHLORIDE 10 MEQ/50ML IV SOLN
10.0000 meq | INTRAVENOUS | Status: AC
  Administered 2011-11-24 (×2): 10 meq via INTRAVENOUS
  Filled 2011-11-24: qty 100

## 2011-11-24 MED ORDER — BISACODYL 10 MG RE SUPP: 10.0000 mg | Freq: Every day | RECTAL | Status: DC | PRN

## 2011-11-24 MED ORDER — METOPROLOL TARTRATE 1 MG/ML IV SOLN
2.5000 mg | Freq: Three times a day (TID) | INTRAVENOUS | Status: DC
  Administered 2011-11-24 – 2011-11-26 (×6): 2.5 mg via INTRAVENOUS
  Filled 2011-11-24 (×10): qty 5

## 2011-11-24 NOTE — Evaluation (Signed)
Physical Therapy Evaluation Patient Details Name: Lauren Morrow MRN: [id number] DOB: 03-31-1924 Today's Date: 11/24/2011 Time: 1000-1034 PT Time Calculation (min): 34 min  PT Assessment / Plan / Recommendation Clinical Impression  78 yo s/p AAA repair with retroperitoneal bleeding requiring exploratiory laparotomy. Pt with recent AVR requiring post-acute SNF stay for rehab x 28 days. Pt lives alone and will require post-acute therapies again.    PT Assessment  Patient needs continued PT services    Follow Up Recommendations  SNF;Supervision/Assistance - 24 hour    Does the patient have the potential to tolerate intense rehabilitation      Barriers to Discharge Decreased caregiver support      Equipment Recommendations  None recommended by PT    Recommendations for Other Services     Frequency Min 3X/week    Precautions / Restrictions Precautions Precautions: Fall Restrictions Weight Bearing Restrictions: No   Pertinent Vitals/Pain HR max 142, down to 116 in chair RR 22-42 (pt responded well to cuing for slow, deep breaths) SaO2 on 5L 90-96%      Mobility  Bed Mobility Bed Mobility: Rolling Right;Right Sidelying to Sit;Sitting - Scoot to Edge of Bed Rolling Right: 4: Min assist;With rail Right Sidelying to Sit: 3: Mod assist;HOB flat;With rails Sitting - Scoot to Edge of Bed: 4: Min guard Details for Bed Mobility Assistance: requires assist to bend legs to assist with lower body coming over to her side; assist to move legs over EOB and to raise torso to sitting; incr time to scoot to EOB Transfers Transfers: Sit to Stand;Stand to Sit Sit to Stand: 4: Min assist;From elevated surface;With upper extremity assist;From bed (ICU bed with seat deflated) Stand to Sit: 3: Mod assist;With upper extremity assist;With armrests;To chair/3-in-1;To elevated surface (cushion in seat of chair) Details for Transfer Assistance: slight assist to come forward over her BOS; as  sitting, incr assist to guide hips to center of cushion and to control descent Ambulation/Gait Ambulation/Gait Assistance: 1: +2 Total assist Ambulation/Gait: Patient Percentage: 70% Ambulation Distance (Feet): 2 Feet Assistive device: 2 person hand held assist Ambulation/Gait Assistance Details: pt with wide BOS and poor lateral wt-shifting to unweight legs for advancing/stepping; very small steps to travel 2 ft  Gait Pattern: Step-to pattern;Decreased stride length;Decreased hip/knee flexion - right;Decreased hip/knee flexion - left;Decreased weight shift to right;Decreased weight shift to left;Right foot flat;Left foot flat;Shuffle;Wide base of support    Shoulder Instructions     Exercises General Exercises - Lower Extremity Ankle Circles/Pumps: AROM;Both;10 reps;Supine   PT Diagnosis: Difficulty walking;Generalized weakness  PT Problem List: Decreased strength;Decreased activity tolerance;Decreased balance;Decreased mobility;Decreased cognition;Decreased knowledge of use of DME;Cardiopulmonary status limiting activity PT Treatment Interventions: DME instruction;Gait training;Functional mobility training;Therapeutic activities;Therapeutic exercise;Balance training;Cognitive remediation;Patient/family education   PT Goals Acute Rehab PT Goals PT Goal Formulation: With patient Time For Goal Achievement: 12/08/11 Potential to Achieve Goals: Good Pt will Roll Supine to Right Side: with modified independence;with rail PT Goal: Rolling Supine to Right Side - Progress: Goal set today Pt will go Supine/Side to Sit: with supervision;with HOB 0 degrees;with rail PT Goal: Supine/Side to Sit - Progress: Goal set today Pt will go Sit to Supine/Side: with supervision;with HOB 0 degrees;with rail PT Goal: Sit to Supine/Side - Progress: Goal set today Pt will go Sit to Stand: with supervision;with upper extremity assist PT Goal: Sit to Stand - Progress: Goal set today Pt will go Stand to Sit: with  supervision;with upper extremity assist PT Goal: Stand to Sit - Progress: Goal  set today Pt will Ambulate: with min assist;>150 feet;with least restrictive assistive device PT Goal: Ambulate - Progress: Goal set today  Visit Information  Last PT Received On: 11/24/11 Assistance Needed: +2 (for lines/walking) PT/OT Co-Evaluation/Treatment: Yes    Subjective Data  Subjective: Pt reports she plans to go to rehab in [place] (as she did after AVR ) Patient Stated Goal: ultimately return home alone   Prior Functioning  Home Living Lives With: Alone Available Help at Discharge: Skilled Nursing Facility Type of Home: House Home Access: Stairs to enter [employer] of Steps: 2 in back; 3 in front Entrance Stairs-Rails: Right;Left Home Layout: One level Bathroom Shower/Tub: Health visitor: Standard Bathroom Accessibility: Yes How Accessible: Accessible via walker Home Adaptive Equipment: Straight cane;Walker - rolling;Shower chair with back;Grab bars in shower Prior Function Level of Independence: Independent with assistive device(s) Able to Take Stairs?: Yes Driving: Yes Vocation: Retired Musician: H[employer] Dominant Hand: Right    Cognition  Overall Cognitive Status: Impaired Area of Impairment: Awareness of errors;Problem solving Arousal/Alertness: Awake/alert Orientation Level: Appears intact for tasks assessed Behavior During Session: [facility] for tasks performed Awareness of Errors: Assistance required to identify errors made Awareness of Errors - Other Comments: unaware of losing balance posteriorly as fatigued; Problem Solving: required assist to position comb in her hand to reach different parts of her head/hair Cognition - Other Comments: Pt. slow to process information    Extremity/Trunk Assessment Right Upper Extremity Assessment RUE ROM/Strength/Tone: Deficits RUE ROM/Strength/Tone Deficits: Shoulder strength 2/5; AAROM WFL; elbow  distally 3/5. RUE Coordination: Deficits RUE Coordination Deficits: due to weakness and arthritic deformity Left Upper Extremity Assessment LUE ROM/Strength/Tone: Deficits LUE ROM/Strength/Tone Deficits: 3-/5 strength shoulder; elbow distally 3+/5 LUE Coordination: Deficits LUE Coordination Deficits: Pt. with arthritic deformity bil. hands Right Lower Extremity Assessment RLE ROM/Strength/Tone: Deficits RLE ROM/Strength/Tone Deficits: AAROM WFL; strength grossly 3+ to 4 Left Lower Extremity Assessment LLE ROM/Strength/Tone: Deficits LLE ROM/Strength/Tone Deficits: AAROM WFL; strength grossly 3+ to 4 Trunk Assessment Trunk Assessment: Normal   Balance Balance Balance Assessed: Yes Static Sitting Balance Static Sitting - Balance Support: Bilateral upper extremity supported;Feet supported (hands on her knees) Static Sitting - Level of Assistance: 5: Stand by assistance Dynamic Sitting Balance Dynamic Sitting - Balance Support: Right upper extremity supported;Left upper extremity supported;Feet supported;During functional activity (alternating UE support) Dynamic Sitting - Level of Assistance: 4: Min assist Dynamic Sitting - Balance Activities: Other (comment) (combing hair) Static Standing Balance Static Standing - Balance Support: Bilateral upper extremity supported Static Standing - Level of Assistance: 4: Min assist Static Standing - Comment/# of Minutes: stood for ~5 minutes with pericare due to incontinence of Bowels  End of Session PT - End of Session Equipment Utilized During Treatment: Gait belt Activity Tolerance: Patient limited by fatigue Patient left: in chair;with call bell/phone within reach;with nursing in room Nurse Communication: Mobility status  GP     Kriston Pasquarello 11/24/2011, 11:05 AM  Pager [phone]

## 2011-11-24 NOTE — Progress Notes (Signed)
RN took pt off bipap per md, placed on 5L [place]

## 2011-11-24 NOTE — Progress Notes (Signed)
Patient ID: Lauren Morrow, female   DOB: 1924-04-07, 78 y.o.   MRN: [id number] Vascular Surgery Progress Note  Subjective: 3 days post resection and grafting of abdominal aortic aneurysm with return to OR for bleeding from lumbar branch within the aneurysm sac. Patient has been hemodynamically stable over weekend. Hemoglobin has been stable with no further evidence of bleeding. Renal function has been stable with no rise in creatinine and adequate urinary output. Patient extubated yesterday and currently on BiPAP with O2 sats satisfactory. Bowel movement yesterday.  Objective:  Filed Vitals:   11/24/11 0700  BP: 145/55  Pulse: 82  Temp:   Resp: 23    General alert and oriented x3 with BiPAP mask in place Lungs no rhonchi or wheezing Abdomen relatively soft-appropriate-nontender-nasogastric tube in place  Extremities 3+ femoral pulses bilaterally with well-perfused lower extremities   Labs:  Lab 11/24/11 0424 11/23/11 0300 11/22/11 0400  CREATININE 0.65 0.73 0.60    Lab 11/24/11 0424 11/23/11 0300 11/22/11 0400  NA 140 140 138  K 3.3* 3.4* 4.3  CL 105 106 108  CO2 26 24 22   BUN 18 18 13   CREATININE 0.65 0.73 0.60  LABGLOM -- -- --  GLUCOSE 115* -- --  CALCIUM 8.1* 7.7* 6.4*    Lab 11/24/11 0424 11/23/11 0300 11/22/11 1600  WBC 14.4* 17.0* 18.7*  HGB 10.4* 12.0 13.8  HCT 30.3* 34.3* 39.9  PLT 77* 82* 101*    Lab 11/21/11 1726 11/21/11 1259  INR 1.33 1.37    I/O last 3 completed shifts: In: 1633.4 [I.V.:1073.4; NG/GT:210; IV Piggyback:350] Out: 3500 [Urine:2350; Emesis/NG output:1150]  Imaging: Dg Chest Port 1 View  11/23/2011  *RADIOLOGY REPORT*  Clinical Data: Respiratory failure  PORTABLE CHEST - 1 VIEW  Comparison: 11/22/2011  Findings: Endotracheal tube terminates at the carina.  Withdrawal approximately 3 cm is suggested.  Chronic interstitial markings.  Patchy bibasilar atelectasis, right greater than left.  Possible small pleural effusions.  No  pneumothorax.  Stable right IJ Swan-Ganz catheter with its tip in the right main pulmonary artery.  Enteric tube coursing below the diaphragm.  Heart is normal in size.  Prosthetic aortic valve.  Multiple right rib deformities.  IMPRESSION: Endotracheal tube terminates at the carina.  Withdrawal approximately 3 cm is suggested.  Patchy bibasilar opacities, likely atelectasis, with possible small pleural effusions.   Original Report Authenticated By: Charline Bills, M.D.     Assessment/Plan:  POD #3  LOS: 3 days  s/p Procedure(s): Resection and grafting of abdominal aortic aneurysm with return to OR 3 days ago for postoperative bleeding  currently doing well extubated yesterday no evidence of any recurrent bleeding with stable renal function  Plan #1 out of bed today in chair #2 DC nasogastric tube keep strict n.p.o. #3 continue pulmonary toilet and monitoring renal function #4 continue to watch platelet count which is 77,000 and relatively stable no indication for platelet transfusion #5 continue to monitor CBC-hemoglobin stable at 10 g  Continues to make progress    Josephina Gip, MD 11/24/2011 7:42 AM

## 2011-11-24 NOTE — Care Management Note (Signed)
    Page 1 of 1   12/02/2011     4:35:59 PM   CARE MANAGEMENT NOTE 12/02/2011  Patient:  Lauren Morrow, Lauren Morrow   Account Number:  [id number]  Date Initiated:  11/24/2011  Documentation initiated by:  [name]  Subjective/Objective Assessment:   Planned AAA - then retroperitoneal bleed requiring emergent return to surgery - post op on vent.,     Action/Plan:   Anticipated DC Date:  12/01/2011   Anticipated DC Plan:  SKILLED NURSING FACILITY  In-house referral  Clinical Social Worker      DC Planning Services  CM consult      Choice offered to / List presented to:             Status of service:  Completed, signed off Medicare Important Message given?   (If response is "NO", the following Medicare IM given date fields will be blank) Date Medicare IM given:   Date Additional Medicare IM given:    Discharge Disposition:  SKILLED NURSING FACILITY  Per UR Regulation:  Reviewed for med. necessity/level of care/duration of stay  If discussed at Long Length of Stay Meetings, dates discussed:    Comments:  ContactAlgis Liming Sister [phone]                 Isley,Nancy Sister [phone]  12/02/11 Varetta Chavers,RN,BSN [phone] PT FOR DC TO SNF TODAY, PER CSW ARRANGEMENTS.  11-24-11 2pm [name], RNBSN [phone] Patient sitting up in chair. Confirmed lives at home alone. States on discharge she plans to go to [facility] and nursing facility.  For contact - ok to call Bonita Quin - sister. SW consult placed.

## 2011-11-24 NOTE — Evaluation (Signed)
Occupational Therapy Evaluation Patient Details Name: Lauren Morrow MRN: [id number] DOB: 1924-11-27 Today's Date: 11/24/2011 Time: 4098-1191 OT Time Calculation (min): 33 min  OT Assessment / Plan / Recommendation Clinical Impression  This 78 y.o. female admitted for elective AAA repair.   Pt. is doing well post op day 1, and will benefit from acute OT to maximize safety and independence with BADLs.  Pt. will  benefit from SNF level rehab at discharge to allow her to return home at modified independent level    OT Assessment  Patient needs continued OT Services    Follow Up Recommendations  SNF    Barriers to Discharge Decreased caregiver support    Equipment Recommendations  None recommended by PT    Recommendations for Other Services    Frequency  Min 2X/week    Precautions / Restrictions Precautions Precautions: Fall Restrictions Weight Bearing Restrictions: No       ADL  Eating/Feeding: Moderate assistance (for ice chips) Where Assessed - Eating/Feeding: Chair Grooming: Maximal assistance;Brushing hair Where Assessed - Grooming: Unsupported sitting Upper Body Bathing: Maximal assistance Where Assessed - Upper Body Bathing: Supported sitting Lower Body Bathing: +1 Total assistance Where Assessed - Lower Body Bathing: Supported sit to stand Upper Body Dressing: Maximal assistance Where Assessed - Upper Body Dressing: Unsupported sitting Lower Body Dressing: +1 Total assistance Where Assessed - Lower Body Dressing: Supported sit to Pharmacist, hospital: +2 Total assistance Toilet Transfer: Patient Percentage: 70% Statistician Method: Stand pivot Toileting - Clothing Manipulation and Hygiene: +1 Total assistance Where Assessed - Glass blower/designer Manipulation and Hygiene: Standing Equipment Used: Gait belt ADL Comments: Pt. demonstrated difficulty orienting comb correctly to comb hair. Required hand over hand assist, and required assist at elbovws due to  shoulder weakness    OT Diagnosis: Generalized weakness  OT Problem List: Decreased strength;Decreased activity tolerance;Impaired balance (sitting and/or standing);Decreased knowledge of use of DME or AE;Cardiopulmonary status limiting activity OT Treatment Interventions: Self-care/ADL training;DME and/or AE instruction;Therapeutic activities;Patient/family education;Balance training   OT Goals Acute Rehab OT Goals OT Goal Formulation: With patient Time For Goal Achievement: 12/08/11 Potential to Achieve Goals: Good ADL Goals Pt Will Perform Eating: Independently;Sitting, chair ADL Goal: Eating - Progress: Goal set today Pt Will Perform Grooming: with min assist;Standing at sink ADL Goal: Grooming - Progress: Goal set today Pt Will Perform Upper Body Bathing: with set-up;Sitting, chair ADL Goal: Upper Body Bathing - Progress: Goal set today Pt Will Perform Lower Body Bathing: with min assist;Sit to stand from chair;Sit to stand from bed ADL Goal: Lower Body Bathing - Progress: Goal set today Pt Will Perform Lower Body Dressing: with min assist;Sit to stand from bed;Sit to stand from chair ADL Goal: Lower Body Dressing - Progress: Goal set today Pt Will Transfer to Toilet: Ambulation;Comfort height toilet (min guard assist) ADL Goal: Toilet Transfer - Progress: Goal set today Pt Will Perform Toileting - Clothing Manipulation: with supervision;Standing ADL Goal: Toileting - Clothing Manipulation - Progress: Goal set today Additional ADL Goal #1: Pt. will demonstrate bil. UE strength at least 4/5 ADL Goal: Additional Goal #1 - Progress: Goal set today Additional ADL Goal #2: Pt. will participate in 25 mins therapeutic activity to increase endurance as needed for ADLs ADL Goal: Additional Goal #2 - Progress: Goal set today  Visit Information  Last OT Received On: 11/24/11 Assistance Needed: +2 (for lines/walking) PT/OT Co-Evaluation/Treatment: Yes    Subjective Data  Subjective: "I  feel okay" Patient Stated Goal: Pt did not state  Prior Functioning     Home Living Lives With: Alone Available Help at Discharge: Skilled Nursing Facility Type of Home: House Home Access: Stairs to enter [employer] of Steps: 2 in back; 3 in front Entrance Stairs-Rails: Right;Left Home Layout: One level Bathroom Shower/Tub: Health visitor: Standard Bathroom Accessibility: Yes How Accessible: Accessible via walker Home Adaptive Equipment: Straight cane;Walker - rolling;Shower chair with back;Grab bars in shower Prior Function Level of Independence: Independent with assistive device(s) Able to Take Stairs?: Yes Driving: Yes Vocation: Retired Musician: [employer] Dominant Hand: Right         Vision/Perception     Cognition  Overall Cognitive Status: Impaired Area of Impairment: Awareness of errors;Problem solving Arousal/Alertness: Awake/alert Orientation Level: Appears intact for tasks assessed Behavior During Session: [facility] for tasks performed Awareness of Errors: Assistance required to identify errors made Awareness of Errors - Other Comments: unaware of losing balance posteriorly as fatigued; Problem Solving: required assist to position comb in her hand to reach different parts of her head/hair Cognition - Other Comments: Pt. slow to process information    Extremity/Trunk Assessment Right Upper Extremity Assessment RUE ROM/Strength/Tone: Deficits RUE ROM/Strength/Tone Deficits: Shoulder strength 2/5; AAROM WFL; elbow distally 3/5. RUE Coordination: Deficits RUE Coordination Deficits: due to weakness and arthritic deformity Left Upper Extremity Assessment LUE ROM/Strength/Tone: Deficits LUE ROM/Strength/Tone Deficits: 3-/5 strength shoulder; elbow distally 3+/5 LUE Coordination: Deficits LUE Coordination Deficits: Pt. with arthritic deformity bil. hands Right Lower Extremity Assessment RLE ROM/Strength/Tone:  Deficits RLE ROM/Strength/Tone Deficits: AAROM WFL; strength grossly 3+ to 4 Left Lower Extremity Assessment LLE ROM/Strength/Tone: Deficits LLE ROM/Strength/Tone Deficits: AAROM WFL; strength grossly 3+ to 4 Trunk Assessment Trunk Assessment: Normal     Mobility Bed Mobility Bed Mobility: Rolling Right;Right Sidelying to Sit;Sitting - Scoot to Edge of Bed Rolling Right: 4: Min assist;With rail Right Sidelying to Sit: 3: Mod assist;HOB flat;With rails Sitting - Scoot to Edge of Bed: 4: Min guard Details for Bed Mobility Assistance: requires assist to bend legs to assist with lower body coming over to her side; assist to move legs over EOB and to raise torso to sitting; incr time to scoot to EOB Transfers Transfers: Sit to Stand;Stand to Sit Sit to Stand: 4: Min assist;From elevated surface;With upper extremity assist;From bed (ICU bed with seat deflated) Stand to Sit: 3: Mod assist;With upper extremity assist;With armrests;To chair/3-in-1;To elevated surface (cushion in seat of chair) Details for Transfer Assistance: slight assist to come forward over her BOS; as sitting, incr assist to guide hips to center of cushion and to control descent     Shoulder Instructions     Exercise General Exercises - Lower Extremity Ankle Circles/Pumps: AROM;Both;10 reps;Supine   Balance Balance Balance Assessed: Yes Static Sitting Balance Static Sitting - Balance Support: Bilateral upper extremity supported;Feet supported (hands on her knees) Static Sitting - Level of Assistance: 5: Stand by assistance Dynamic Sitting Balance Dynamic Sitting - Balance Support: Right upper extremity supported;Left upper extremity supported;Feet supported;During functional activity (alternating UE support) Dynamic Sitting - Level of Assistance: 4: Min assist Dynamic Sitting - Balance Activities: Other (comment) (combing hair) Static Standing Balance Static Standing - Balance Support: Bilateral upper extremity  supported Static Standing - Level of Assistance: 4: Min assist Static Standing - Comment/# of Minutes: stood for ~5 minutes with pericare due to incontinence of Bowels   End of Session OT - End of Session Equipment Utilized During Treatment: Gait belt Activity Tolerance: Patient limited by fatigue Patient left: in chair;with call  bell/phone within reach;with nursing in room Nurse Communication: Mobility status  GO     Jeani Hawking M 11/24/2011, 11:06 AM

## 2011-11-24 NOTE — Progress Notes (Signed)
eLink Physician-Brief Progress Note Patient Name: Lauren Morrow DOB: 02/11/1924 MRN: [id number]  Date of Service  11/24/2011   HPI/Events of Note     eICU Interventions  Hypokalemia -repleted    Intervention Category Intermediate Interventions: Electrolyte abnormality - evaluation and management  ALVA,RAKESH V. 11/24/2011, 5:15 AM

## 2011-11-24 NOTE — Progress Notes (Signed)
UR Completed.  Adrien Dietzman Jane [phone] 11/24/2011  

## 2011-11-24 NOTE — Progress Notes (Signed)
PULMONARY  / CRITICAL CARE MEDICINE  Name: Lauren Morrow MRN: [id number] DOB: 05-12-1924    LOS: 3  REFERRING MD :  Josephina Gip    CHIEF COMPLAINT: Post-op respiratory failure  BRIEF PATIENT DESCRIPTION:  78 yo female never smoker admitted on 11/21/2011 for elective repair of AAA.  Developed hypotension/tachycardia post-op in ICU 2nd to retroperitoneal hematoma.  Returned to OR emergently 11/15, and remained on vent post-op.  PCCM consulted 11/15.  Significant PMHx Aortic stenosis s/p bioprosthetic AVR, HTN, PAF, PVD  LINES / TUBES: ETT 11/15>>11/17 Lt Radial aline 11/15>> Rt IJ CVL 11/15>>  CULTURES: None  ANTIBIOTICS: Vancomycin 11/15>>11/15 Ancef 11/15>>11/15 Cefuroxime 11/15>>11/16  SIGNIFICANT EVENTS:  11/15 retroperitoneal bleeding, post-op VDRF 11/17 BiPAP prn after extubation  LEVEL OF CARE:  ICU PRIMARY SERVICE:  Hart [place], [facility] CONSULTANTS:  PCCM CODE STATUS: Full DIET:  NPO DVT Px:  SCD GI Px:  Protonix  INTERVAL HISTORY:  Breathing okay off BiPAP.  Denies chest pain or congestion.  VITAL SIGNS: Temp:  [98 F (36.7 C)-100.8 F (38.2 C)] 98.9 F (37.2 C) (11/18 0814) Pulse Rate:  [80-121] 119  (11/18 0804) Resp:  [18-42] 28  (11/18 0804) BP: (92-191)/(49-90) 145/55 mmHg (11/18 0700) SpO2:  [93 %-100 %] 93 % (11/18 0804) Arterial Line BP: (86-206)/(45-93) 133/49 mmHg (11/18 0700) FiO2 (%):  [40 %-50 %] 50 % (11/18 0600) Weight:  [174 lb 9.7 oz (79.2 kg)] 174 lb 9.7 oz (79.2 kg) (11/18 0600) HEMODYNAMICS: PAP: (29-39)/(21-26) 29/21 mmHg VENTILATOR SETTINGS: Vent Mode:  [-] BIPAP FiO2 (%):  [40 %-50 %] 50 % Set Rate:  [8 bmp] 8 bmp PEEP:  [5 cmH20-6 cmH20] 6 cmH20 Pressure Support:  [5 cmH20] 5 cmH20 INTAKE / OUTPUT: Intake/Output      11/17 0701 - 11/18 0700 11/18 0701 - 11/19 0700   I.V. (mL/kg) 472.8 (6)    NG/GT 30    IV Piggyback 350    Total Intake(mL/kg) 852.8 (10.8)    Urine (mL/kg/hr) 1990 (1)    Emesis/NG output 550    Total  Output 2540    Net -1687.2         Stool Occurrence 1 x      PHYSICAL EXAMINATION: General:  No distress Neuro:  Awake, follows commands HEENT:  No sinus tenderness Cardiovascular:  s1s2 regular, 2/6 SM Lungs:  No wheeze Abdomen:  Soft, decreased bowel sounds, wound dressing clean Musculoskeletal:  No edema Skin:  No rashes   LABS:  Lab 11/24/11 0424 11/23/11 1057 11/23/11 0600 11/23/11 0300 11/22/11 1630 11/22/11 1600 11/22/11 0637 11/22/11 0402 11/22/11 0400 11/21/11 1726 11/21/11 1259  HGB 10.4* -- -- 12.0 -- 13.8 -- -- -- -- --  WBC 14.4* -- -- 17.0* -- 18.7* -- -- -- -- --  PLT 77* -- -- 82* -- 101* -- -- -- -- --  NA 140 -- -- 140 -- -- -- -- 138 -- --  K 3.3* -- -- 3.4* -- -- -- -- -- -- --  CL 105 -- -- 106 -- -- -- -- 108 -- --  CO2 26 -- -- 24 -- -- -- -- 22 -- --  GLUCOSE 115* -- -- 133* -- -- -- -- 175* -- --  BUN 18 -- -- 18 -- -- -- -- 13 -- --  CREATININE 0.65 -- -- 0.73 -- -- -- -- 0.60 -- --  CALCIUM 8.1* -- -- 7.7* -- -- -- -- 6.4* -- --  MG -- -- -- 1.7 -- -- -- --  1.7 -- 1.4*  PHOS -- -- -- -- -- -- -- -- -- -- --  AST -- -- -- -- -- -- -- -- 38* -- --  ALT -- -- -- -- -- -- -- -- 18 -- --  ALKPHOS -- -- -- -- -- -- -- -- 31* -- --  BILITOT -- -- -- -- -- -- -- -- 0.5 -- --  PROT -- -- -- -- -- -- -- -- 4.3* -- --  ALBUMIN -- -- -- -- -- -- -- -- 2.2* -- --  APTT -- -- -- -- -- -- -- -- -- 29 29  INR -- -- -- -- -- -- -- -- -- 1.33 1.37  LATICACIDVEN -- -- -- -- -- -- -- -- -- -- --  TROPONINI -- -- <0.30 <0.30 <0.30 -- -- -- -- -- --  PROCALCITON -- -- -- -- -- -- -- -- -- -- --  PROBNP -- -- -- -- -- -- -- -- -- -- --  O2SATVEN -- -- -- -- -- -- -- -- -- -- --  PHART -- 7.384 -- -- -- -- 7.335* 7.269* -- -- --  PCO2ART -- 39.7 -- -- -- -- 38.2 47.8* -- -- --  PO2ART -- 73.0* -- -- -- -- 88.0 96.0 -- -- --    Lab 11/22/11 1557 11/22/11 1222 11/22/11 0749  GLUCAP 105* 104* 106*    IMAGING:   DIAGNOSES: Principal Problem:  *S/P AAA  repair Active Problems:  Respiratory failure, post-operative  Acute blood loss anemia  Thrombocytopenia due to blood loss  Hypotension  Hypervolemia  Hypokalemia   ASSESSMENT / PLAN:  PULMONARY  ASSESSMENT: Post-op respiratory failure. Improved. PLAN:   Change BiPAP to prn only Bronchial hygienee F/u CXR as needed Adjust oxygen to keep SpO2 > 92%  CARDIOVASCULAR  ASSESSMENT:  S/p AAA repair. Hypotension 2nd to hypovolemia/hemorrhage. Resolved. Sinus tachycardia. Hx of HTN. PLAN:  Post-op care per [facility] Keep even fluid balance Add IV lopressor 2.5 mg q8h, and adjust as needed Defer to [facility] when to d/c arterial line   RENAL  ASSESSMENT:   Hypokalemia. PLAN:   F/u and replace electrolytes as needed  GASTROINTESTINAL  ASSESSMENT:   Nutrition. PLAN:   Advance diet per [facility]  HEMATOLOGIC  ASSESSMENT:   Acute blood loss anemia 2nd to retroperitoneal bleed. Thrombocytopenia. PLAN:  F/u CBC Transfuse for Hb < 7 or bleeding  INFECTIOUS  ASSESSMENT:   No evidence for infection. PLAN:   Monitor clinically  ENDOCRINE  ASSESSMENT:   Hyperglycemia. Likely related to acute stress.  No hx of DM.  PLAN:   Monitor blood sugars on BMET  NEUROLOGIC  ASSESSMENT:   Post-op pain control. PLAN:   Per [facility]  CLINICAL SUMMARY:  Respiratory status slowly improving.  Hopefully will no longer need BiPAP.  She is on lopressor as outpt, and BP and HR are increased>>will add back IV lopressor.  Coralyn Helling, MD [facility] Pulmonary/Critical Care 11/24/2011, 9:40 AM Pager:  [phone] After 3pm call: [phone]

## 2011-11-25 ENCOUNTER — Inpatient Hospital Stay (HOSPITAL_COMMUNITY): Payer: Medicare Other

## 2011-11-25 ENCOUNTER — Encounter (HOSPITAL_COMMUNITY): Payer: Self-pay | Admitting: *Deleted

## 2011-11-25 DIAGNOSIS — Z954 Presence of other heart-valve replacement: Secondary | ICD-10-CM

## 2011-11-25 DIAGNOSIS — E876 Hypokalemia: Secondary | ICD-10-CM

## 2011-11-25 DIAGNOSIS — R5381 Other malaise: Secondary | ICD-10-CM

## 2011-11-25 LAB — CBC WITH DIFFERENTIAL/PLATELET
Basophils Absolute: 0 10*3/uL (ref 0.0–0.1)
Basophils Relative: 0 % (ref 0–1)
Eosinophils Relative: 0 % (ref 0–5)
HCT: 32.8 % — ABNORMAL LOW (ref 36.0–46.0)
Lymphocytes Relative: 7 % — ABNORMAL LOW (ref 12–46)
MCHC: 33.2 g/dL (ref 30.0–36.0)
MCV: 89.1 fL (ref 78.0–100.0)
Monocytes Absolute: 1.4 10*3/uL — ABNORMAL HIGH (ref 0.1–1.0)
RDW: 15.5 % (ref 11.5–15.5)

## 2011-11-25 LAB — D-DIMER, QUANTITATIVE: D-Dimer, Quant: 6.19 ug/mL-FEU — ABNORMAL HIGH (ref 0.00–0.48)

## 2011-11-25 LAB — BASIC METABOLIC PANEL
BUN: 17 mg/dL (ref 6–23)
Creatinine, Ser: 0.51 mg/dL (ref 0.50–1.10)
GFR calc Af Amer: >90 mL/min (ref >90)
GFR calc non Af Amer: 85 mL/min — ABNORMAL LOW (ref >90)
Potassium: 3.4 mEq/L — ABNORMAL LOW (ref 3.5–5.1)

## 2011-11-25 LAB — POCT I-STAT 4, (NA,K, GLUC, HGB,HCT)
Potassium: 4.4 mEq/L (ref 3.5–5.1)
Sodium: 143 mEq/L (ref 135–145)

## 2011-11-25 LAB — COMPREHENSIVE METABOLIC PANEL
AST: 23 U/L (ref 0–37)
BUN: 18 mg/dL (ref 6–23)
CO2: 29 mEq/L (ref 19–32)
Calcium: 8.4 mg/dL (ref 8.4–10.5)
Creatinine, Ser: 0.54 mg/dL (ref 0.50–1.10)
GFR calc non Af Amer: 84 mL/min — ABNORMAL LOW (ref >90)

## 2011-11-25 LAB — CBC
MCHC: 33.1 g/dL (ref 30.0–36.0)
RDW: 16.3 % — ABNORMAL HIGH (ref 11.5–15.5)

## 2011-11-25 MED ORDER — FUROSEMIDE 10 MG/ML IJ SOLN
40.0000 mg | Freq: Three times a day (TID) | INTRAMUSCULAR | Status: AC
  Administered 2011-11-25 (×2): 40 mg via INTRAVENOUS
  Filled 2011-11-25 (×2): qty 4

## 2011-11-25 MED ORDER — POTASSIUM CHLORIDE 10 MEQ/50ML IV SOLN
10.0000 meq | INTRAVENOUS | Status: AC
  Administered 2011-11-25 (×4): 10 meq via INTRAVENOUS
  Filled 2011-11-25: qty 50
  Filled 2011-11-25: qty 150

## 2011-11-25 MED ORDER — POTASSIUM CHLORIDE 10 MEQ/100ML IV SOLN
10.0000 meq | Freq: Every day | INTRAVENOUS | Status: DC | PRN
  Administered 2011-11-29 (×3): 10 meq via INTRAVENOUS
  Filled 2011-11-25: qty 400
  Filled 2011-11-25: qty 200
  Filled 2011-11-25 (×2): qty 100

## 2011-11-25 MED ORDER — KCL IN DEXTROSE-NACL 20-5-0.45 MEQ/L-%-% IV SOLN
Freq: Two times a day (BID) | INTRAVENOUS | Status: DC | PRN
  Filled 2011-11-25: qty 1000

## 2011-11-25 MED ORDER — POTASSIUM CHLORIDE 10 MEQ/50ML IV SOLN
INTRAVENOUS | Status: AC
  Administered 2011-11-25: 10 meq via INTRAVENOUS
  Filled 2011-11-25: qty 50

## 2011-11-25 MED ORDER — OXYCODONE-ACETAMINOPHEN 5-325 MG PO TABS
1.0000 | ORAL_TABLET | ORAL | Status: DC | PRN
  Administered 2011-12-01 (×2): 1 via ORAL
  Filled 2011-11-25 (×2): qty 1

## 2011-11-25 MED ORDER — POTASSIUM CHLORIDE 10 MEQ/50ML IV SOLN
10.0000 meq | INTRAVENOUS | Status: AC
  Administered 2011-11-25 (×3): 10 meq via INTRAVENOUS
  Filled 2011-11-25 (×2): qty 50

## 2011-11-25 MED ORDER — PNEUMOCOCCAL VAC POLYVALENT 25 MCG/0.5ML IJ INJ: 0.5000 mL | INJECTION | INTRAMUSCULAR | Status: DC | PRN

## 2011-11-25 MED FILL — Sodium Chloride IV Soln 0.9%: INTRAVENOUS | Qty: 1000 | Status: AC

## 2011-11-25 MED FILL — Heparin Sodium (Porcine) Inj 1000 Unit/ML: INTRAMUSCULAR | Qty: 30 | Status: AC

## 2011-11-25 MED FILL — Sodium Chloride Irrigation Soln 0.9%: Qty: 3000 | Status: AC

## 2011-11-25 NOTE — Progress Notes (Signed)
Didi Ganaway [place],PT Acute Rehabilitation [phone] (pager)  

## 2011-11-25 NOTE — Progress Notes (Addendum)
[facility]  Post-op  Intra-abdominal Surgery note  Date of Surgery: 11/21/2011  Surgeon(s): Pryor Ochoa, MD Fransisco Hertz, MD  4 Days Post-Op Procedure(s): EXPLORATORY LAPAROTOMY  History of Present Illness  Lauren Morrow is a 78 y.o. female who is  up s/p Procedure(s): EXPLORATORY LAPAROTOMY Pt is doing well. denies incisional pain; denies nausea/vomiting; denies diarrhea. has had flatus;has had BM.  Soft minimal bowel sounds.  IMAGING: No results found.  Significant Diagnostic Studies: CBC Lab Results  Component Value Date   WBC 12.9* 11/25/2011   HGB 9.7* 11/25/2011   HCT 29.3* 11/25/2011   MCV 90.7 11/25/2011   PLT 98* 11/25/2011    BMET    Component Value Date/Time   NA 142 11/25/2011 0420   K 3.4* 11/25/2011 0420   CL 109 11/25/2011 0420   CO2 28 11/25/2011 0420   GLUCOSE 104* 11/25/2011 0420   BUN 17 11/25/2011 0420   CREATININE 0.51 11/25/2011 0420   CREATININE 0.60 10/23/2011 1030   CALCIUM 8.2* 11/25/2011 0420   GFRNONAA 85* 11/25/2011 0420   GFRAA >90 11/25/2011 0420    COAG Lab Results  Component Value Date   INR 1.33 11/21/2011   INR 1.37 11/21/2011   INR 1.01 11/14/2011   No results found for this basename: PTT    I/O last 3 completed shifts: In: 900 [I.V.:700; NG/GT:50; IV Piggyback:150] Out: 1426 [Urine:1200; Emesis/NG output:225; Stool:1]    Physical Examination BP Readings from Last 3 Encounters:  11/25/11 151/68  11/25/11 151/68  11/25/11 151/68   Temp Readings from Last 3 Encounters:  11/25/11 98 F (36.7 C) Oral  11/25/11 98 F (36.7 C) Oral  11/25/11 98 F (36.7 C) Oral   SpO2 Readings from Last 3 Encounters:  11/25/11 96%  11/25/11 96%  11/25/11 96%   Pulse Readings from Last 3 Encounters:  11/25/11 78  11/25/11 78  11/25/11 78    General: A&O x 3, WDWN female in NAD Pulmonary: normal non-labored breathing , without Rales, rhonchi,  wheezing Cardiac: Heart rate : regular  ,  Abdomen:faint sounds with abdominal distention mod. Abdominal wound:clean, dry, intact  Neurologic: A&O X 3; Appropriate Affect ; SENSATION: normal; MOTOR FUNCTION:  moving all extremities equally. Speech is fluent/normal  Vascular Exam:BLE warm and well perfused Extremities without ischemic changes, no Gangrene, no cellulitis; no open wounds;  Doppler DP bilaterally      Assessment/Plan: Lauren Morrow is a 78 y.o. female who is 4 Days Post-Op Procedure(s): Resection and grafting of abdominal aortic aneurysm with return to OR 3 days ago for postoperative bleeding  EXPLORATORY LAPAROTOMY Plt. 98,000 Hemoglobin  9.7 Urine out put daily total 871 Faint bowel sounds continue with ice chips K was given in 3 runs to replace 3.3-3.4    Mosetta Pigeon [phone] 11/25/2011 7:39 AM      Agree with above Patient alert and oriented sitting in chair breathing fairly comfortably on nasal oxygen with O2 sat 96% Abdomen mildly distended but nontender 3+ femoral pulses palpable bilaterally  Patient continues to make progress. Will.on sips of clear liquids today and continue to increase mobilization  Plan rehabilitation medicine consult to see if it would be more appropriate for her to go to rehabilitation versus SNF prior to returning home-lives alone

## 2011-11-25 NOTE — Progress Notes (Signed)
Clinical Social Work Department BRIEF PSYCHOSOCIAL ASSESSMENT 11/25/2011  Patient:  Lauren Morrow, Lauren Morrow     Account Number:  [id number]     Admit date:  11/21/2011  Clinical Social Worker:  Madaline Guthrie  Date/Time:  11/25/2011 01:40 PM  Referred by:  Care Management  Date Referred:  11/25/2011 Referred for  SNF Placement   Other Referral:   Interview type:  Patient Other interview type:    PSYCHOSOCIAL DATA Living Status:  ALONE Admitted from facility:   Level of care:   Primary support name:  Algis Liming Primary support relationship to patient:  SIBLING Degree of support available:    CURRENT CONCERNS Current Concerns  Post-Acute Placement   Other Concerns:    SOCIAL WORK ASSESSMENT / PLAN CSW met with patient about plan for discharge. Patient was understanding and compliant with the need for a skilled nursing facility. Patient requested a nursing home in [place], [place] preferably [facility]. CSW explained to patient that she would fax out to numerous nursing facilities and then discuss the bed offers with patient.   Assessment/plan status:  Other - See comment Other assessment/ plan:   Facilitate SNF placement.   Information/referral to community resources:    PATIENT'S/FAMILY'S RESPONSE TO PLAN OF CARE: Patient is in agreement with short term SNF placement.

## 2011-11-25 NOTE — Consult Note (Signed)
Physical Medicine and Rehabilitation Consult Reason for Consult: deconditioning Referring Physician:  Dr. Hart Rochester   HPI: Lauren Morrow is a 78 y.o. female with history of AVR, dizziness, AAA who was admitted on 11/21/11 for resection and grafting of AAA by Dr. Hart Rochester. Patient had sudden drop of BP with tachycardia in PACU. Was treated with 2 units PRBC and 2 units of FFP for ABLA. She was taken back to OR for exp lap with evacuation of intra-abdominal hematoma and ligation of bleeding lumbar branch later that evening. Extubated on 11/17 to BIPAP and volume overload treated with diuretics. Remains NPO except for sips.  PT/OT evaluations done yesterday and patient noted to be deconditioned. MD recommending CIR.    Review of Systems  HENT: Positive for hearing loss.   Eyes: Negative for blurred vision and double vision.  Respiratory: Positive for shortness of breath (with exertion.).   Cardiovascular: Negative for chest pain and palpitations.  Gastrointestinal: Positive for abdominal pain. Negative for nausea and vomiting.  Musculoskeletal: Positive for joint pain (bilateral hands OA). Negative for back pain.  Neurological: Positive for weakness.   Past Medical History  Diagnosis Date  . Varicose veins   . Seasonal allergies   . Osteomyelitis     history of osteomyelistis in the leg  . Aortic stenosis     Dr. Terrilee Files, saw last 09/11/11  . Dizziness   . Peripheral vascular disease   . Hypertension     sees Dr. Sherril Croon, primary  . Atrial fibrillation     Post op, sees  Dr. Kirke Corin  . Anxiety   . Shortness of breath     "gets short of breath at time since the heart surgery 2012"  . Urinary tract infection     hx of  . Kidney stones     hx of  . GERD (gastroesophageal reflux disease)   . Arthritis   . Anemia     hx of  . Low back pain   . Cataracts, bilateral    Past Surgical History  Procedure Date  . Spine surgery 1967  1976    dr. Ollen Bowl  . Aortic valve replacement  11/22/2010    21mm pericardial valve serial#[id number], model [id number]  . Cardiac catheterization   . Aortic valve replacement     In 2012 with a bioprosthetic valve  . Bladder tact     1960's  . Abdominal aortic aneurysm repair 11/21/2011    Procedure: ANEURYSM ABDOMINAL AORTIC REPAIR;  Surgeon: Pryor Ochoa, MD;  Location: [facility] OR;  Service: Vascular;  Laterality: N/A;  using 16 x 8 mm x 40 cm hemashield graft  . Patch angioplasty 11/21/2011    Procedure: PATCH ANGIOPLASTY;  Surgeon: Pryor Ochoa, MD;  Location: [facility] OR;  Service: Vascular;  Laterality: Right;  Iliac vein  . Laparotomy 11/21/2011    Procedure: EXPLORATORY LAPAROTOMY;  Surgeon: Pryor Ochoa, MD;  Location: [facility] OR;  Service: Vascular;  Laterality: N/A;  exploratory laparotomy, evacuation of hematoma, suture ligation of lumbar arterial branch    Family History  Problem Relation Age of Onset  . Other Mother     Varicose Veins  . Cancer Sister   . Hyperlipidemia Brother   . Other Brother    Social History: Widowed. Lives alone.  reports that she has never smoked. She has never used smokeless tobacco. She reports that she does not drink alcohol or use illicit drugs.  Allergies  Allergen Reactions  . Avelox (Moxifloxacin Hcl In  Nacl) Rash    Avelox began at approximately 0800. At 0807 pt noted to have red-streaking proximal to the PIV site, up the left arm, to the neck/chest area. Avelox immediately discontinued. Pt received about 1/4 the dose or 100mg . Vital signs remained stable. The anesthesiologist and surgeon were notified and shown the site of red, rashy streaking.  Marland Kitchen Penicillins Swelling  . Sulfa Antibiotics Other (See Comments)    unknown   Medications Prior to Admission  Medication Sig Dispense Refill  . aspirin EC 325 MG tablet Take 1 tablet (325 mg total) by mouth daily.  30 tablet  0  . b complex vitamins tablet Take 1 tablet by mouth daily.      . calcium-vitamin D (OSCAL WITH D) 500-200 MG-UNIT per tablet  Take 1 tablet by mouth 2 (two) times daily.        . Cholecalciferol (VITAMIN D) 1000 UNITS capsule Take 1,000 Units by mouth daily.      . fish oil-omega-3 fatty acids 1000 MG capsule Take 1 g by mouth daily.        . meclizine (ANTIVERT) 25 MG tablet Take 25 mg by mouth as needed.      . metoprolol tartrate (LOPRESSOR) 25 MG tablet Take 25 mg by mouth 2 (two) times daily.      . Multiple Vitamin (MULTIVITAMIN) tablet Take 1 tablet by mouth daily.        Marland Kitchen omeprazole (PRILOSEC) 20 MG capsule Take 20 mg by mouth daily.      . sertraline (ZOLOFT) 50 MG tablet Take 50 mg by mouth daily.       . diphenhydrAMINE (BENADRYL) 12.5 MG/5ML elixir Take 10 mLs (25 mg total) by mouth at bedtime as needed for sleep.  120 mL  0    Home: Home Living Lives With: Alone Available Help at Discharge: Skilled Nursing Facility Type of Home: House Home Access: Stairs to enter [employer] of Steps: 2 in back; 3 in front Entrance Stairs-Rails: Right;Left Home Layout: One level Bathroom Shower/Tub: Health visitor: Standard Bathroom Accessibility: Yes How Accessible: Accessible via walker Home Adaptive Equipment: Straight cane;Walker - rolling;Shower chair with back;Grab bars in shower  Functional History: Prior Function Able to Take Stairs?: Yes Driving: Yes Vocation: Retired Functional Status:  Mobility: Bed Mobility Bed Mobility: Rolling Right;Right Sidelying to Sit;Sitting - Scoot to Edge of Bed Rolling Right: 4: Min assist;With rail Right Sidelying to Sit: 3: Mod assist;HOB flat;With rails Sitting - Scoot to Edge of Bed: 4: Min guard Transfers Transfers: Sit to Stand;Stand to Sit Sit to Stand: 4: Min assist;From elevated surface;With upper extremity assist;From bed (ICU bed with seat deflated) Stand to Sit: 3: Mod assist;With upper extremity assist;With armrests;To chair/3-in-1;To elevated surface (cushion in seat of chair) Ambulation/Gait Ambulation/Gait Assistance: 1:  +2 Total assist Ambulation/Gait: Patient Percentage: 70% Ambulation Distance (Feet): 2 Feet Assistive device: 2 person hand held assist Ambulation/Gait Assistance Details: pt with wide BOS and poor lateral wt-shifting to unweight legs for advancing/stepping; very small steps to travel 2 ft  Gait Pattern: Step-to pattern;Decreased stride length;Decreased hip/knee flexion - right;Decreased hip/knee flexion - left;Decreased weight shift to right;Decreased weight shift to left;Right foot flat;Left foot flat;Shuffle;Wide base of support    ADL: ADL Eating/Feeding: Moderate assistance (for ice chips) Where Assessed - Eating/Feeding: Chair Grooming: Maximal assistance;Brushing hair Where Assessed - Grooming: Unsupported sitting Upper Body Bathing: Maximal assistance Where Assessed - Upper Body Bathing: Supported sitting Lower Body Bathing: +1 Total assistance Where Assessed -  Lower Body Bathing: Supported sit to stand Upper Body Dressing: Maximal assistance Where Assessed - Upper Body Dressing: Unsupported sitting Lower Body Dressing: +1 Total assistance Where Assessed - Lower Body Dressing: Supported sit to stand Toilet Transfer: +2 Total assistance Toilet Transfer Method: Stand pivot Equipment Used: Gait belt ADL Comments: Pt. demonstrated difficulty orienting comb correctly to comb hair. Required hand over hand assist, and required assist at elbovws due to shoulder weakness  Cognition: Cognition Arousal/Alertness: Awake/alert Orientation Level: Oriented X4 Cognition Overall Cognitive Status: Impaired Area of Impairment: Awareness of errors;Problem solving Arousal/Alertness: Awake/alert Orientation Level: Appears intact for tasks assessed Behavior During Session: [facility] for tasks performed Awareness of Errors: Assistance required to identify errors made Awareness of Errors - Other Comments: unaware of losing balance posteriorly as fatigued; Problem Solving: required assist to position  comb in her hand to reach different parts of her head/hair Cognition - Other Comments: Pt. slow to process information  Blood pressure 123/60, pulse 75, temperature 98 F (36.7 C), temperature source Oral, resp. rate 30, height 5\' 8"  (1.727 m), weight 75.7 kg (166 lb 14.2 oz), SpO2 97.00%. Physical Exam  Nursing note and vitals reviewed. Constitutional: She is oriented to person, place, and time. She appears well-developed and well-nourished.  HENT:  Head: Normocephalic and atraumatic.  Eyes: Pupils are equal, round, and reactive to light.  Neck: Normal range of motion.  Cardiovascular: Normal rate and regular rhythm.   Pulmonary/Chest: Effort normal. She has decreased breath sounds in the right lower field.  Abdominal: Bowel sounds are absent. There is generalized tenderness.       Abdominal incision dressed, area appropiately tender. Bowels sounds decreased but present  Musculoskeletal: She exhibits no edema.  Neurological: She is alert and oriented to person, place, and time.       Moves all 4's with mild proximal weakness, mostly due to pain.  Skin: Skin is warm and dry.    Results for orders placed during the hospital encounter of 11/21/11 (from the past 24 hour(s))  BASIC METABOLIC PANEL     Status: Abnormal   Collection Time   11/25/11  4:20 AM      Component Value Range   Sodium 142  135 - 145 mEq/L   Potassium 3.4 (*) 3.5 - 5.1 mEq/L   Chloride 109  96 - 112 mEq/L   CO2 28  19 - 32 mEq/L   Glucose, Bld 104 (*) 70 - 99 mg/dL   BUN 17  6 - 23 mg/dL   Creatinine, Ser 1.61  0.50 - 1.10 mg/dL   Calcium 8.2 (*) 8.4 - 10.5 mg/dL   GFR calc non Af Amer 85 (*) >90 mL/min   GFR calc Af Amer >90  >90 mL/min  CBC     Status: Abnormal   Collection Time   11/25/11  4:20 AM      Component Value Range   WBC 12.9 (*) 4.0 - 10.5 K/uL   RBC 3.23 (*) 3.87 - 5.11 MIL/uL   Hemoglobin 9.7 (*) 12.0 - 15.0 g/dL   HCT 09.6 (*) 04.5 - 40.9 %   MCV 90.7  78.0 - 100.0 fL   MCH 30.0  26.0 -  34.0 pg   MCHC 33.1  30.0 - 36.0 g/dL   RDW 81.1 (*) 91.4 - 78.2 %   Platelets 98 (*) 150 - 400 K/uL   No results found.  Assessment/Plan: Diagnosis: AAA s/p repair with post-operative complications, deconditioning 1. Does the need for close, 24 hr/day  medical supervision in concert with the patient's rehab needs make it unreasonable for this patient to be served in a less intensive setting? Yes 2. Co-Morbidities requiring supervision/potential complications: abla, AS, htn, afib,hx af AVR 3. Due to bladder management, bowel management, safety, skin/wound care, disease management, medication administration, pain management and patient education, does the patient require 24 hr/day rehab nursing? Yes 4. Does the patient require coordinated care of a physician, rehab nurse, PT (1-2 hrs/day, 5 days/week) and OT (1-2 hrs/day, 5 days/week) to address physical and functional deficits in the context of the above medical diagnosis(es)? Yes Addressing deficits in the following areas: balance, endurance, locomotion, strength, transferring, bowel/bladder control, bathing, dressing, feeding, grooming, toileting and psychosocial support 5. Can the patient actively participate in an intensive therapy program of at least 3 hrs of therapy per day at least 5 days per week? Yes 6. The potential for patient to make measurable gains while on inpatient rehab is excellent 7. Anticipated functional outcomes upon discharge from inpatient rehab are supervision to min assist with PT, supervision to min/mod assist with OT, n/a with SLP. 8. Estimated rehab length of stay to reach the above functional goals is: 10days 9. Does the patient have adequate social supports to accommodate these discharge functional goals? Yes and Potentially 10. Anticipated D/C setting: Home 11. Anticipated post D/C treatments: HH therapy 12. Overall Rehab/Functional Prognosis: excellent  RECOMMENDATIONS: This patient's condition is appropriate  for continued rehabilitative care in the following setting: CIR Patient has agreed to participate in recommended program. Yes  Note that insurance prior authorization may be required for reimbursement for recommended care.  Comment:Rehab RN to follow up.   Ivory Broad, MD     11/25/2011

## 2011-11-25 NOTE — Progress Notes (Addendum)
Physical Therapy Treatment Patient Details Name: Lauren Morrow MRN: [id number] DOB: 07-01-1924 Today's Date: 11/25/2011 Time: 4098-1191 PT Time Calculation (min): 17 min  PT Assessment / Plan / Recommendation Comments on Treatment Session  Pt s/p AAA repair (11/21/11).  Improved strength and mobility today, walked 33ft pushing WC on 3L O2.  Will continue to benefit from skilled physical therapy.    Follow Up Recommendations  SNF;Supervision/Assistance - 24 hour           Equipment Recommendations  None recommended by PT       Frequency Min 3X/week   Plan Discharge plan remains appropriate;Frequency remains appropriate    Precautions / Restrictions Precautions Precautions: Fall Restrictions Weight Bearing Restrictions: No   Pertinent Vitals/Pain HR 75-89 with activity O2 sats remains > 95% with activity, on3L O2 via [place] BP: 123/65 Denies pain.    Mobility  Bed Mobility Bed Mobility: Sit to Supine;Scooting to HOB Sit to Supine: 2: Max assist Scooting to [facility]: 3: Mod assist Details for Bed Mobility Assistance: Pt unable to control sit to supine as she was fatigued from walking, resulting in lying diagonal on bed, vc for positioning.  Transfers Transfers: Sit to Stand;Stand to Sit Sit to Stand: 3: Min assist;From chair/3-in-1;With upper extremity assist Stand to Sit: 3: Min assist;To bed;With upper extremity assist Details for Transfer Assistance: Assist for smooth transfer and vc for hand placement on stand to sit. Ambulation/Gait Ambulation/Gait Assistance: 1: +2 Total assist Ambulation/Gait: Patient Percentage: 70% Ambulation Distance (Feet): 60 Feet Assistive device: Other (Comment) (pushed WC) Ambulation/Gait Assistance Details: shuffled and unsteady gait, difficulty pushing and remaining close to WC, req many vc for safety and encouragement Gait Pattern: Shuffle;Step-to pattern;Decreased stride length Gait velocity: slowed General Gait Details: one standing  rest break; pt surprised with distance she walked Stairs: No Wheelchair Mobility Wheelchair Mobility: No      PT Goals Acute Rehab PT Goals PT Goal: Sit to Supine/Side - Progress: Progressing toward goal PT Goal: Sit to Stand - Progress: Progressing toward goal PT Goal: Stand to Sit - Progress: Progressing toward goal PT Goal: Ambulate - Progress: Progressing toward goal  Visit Information  Last PT Received On: 11/25/11 Assistance Needed: +2    Subjective Data  Subjective: Pt states she is tired and not sure if she is strong enough to walk Patient Stated Goal: ultimately return home alone   Cognition  Overall Cognitive Status: Impaired Area of Impairment: Awareness of errors Arousal/Alertness: Awake/alert Orientation Level: Appears intact for tasks assessed Behavior During Session: [facility] for tasks performed Awareness of Errors: Assistance required to identify errors made Awareness of Errors - Other Comments: unaware of safety errors while pushing WC- vc to stand close     Balance  Balance Balance Assessed: Yes Static Standing Balance Static Standing - Balance Support: Bilateral upper extremity supported Static Standing - Level of Assistance: 4: Min assist Static Standing - Comment/# of Minutes: stood prior to ambulation and for one standing rest break during ambulation without difficulty   End of Session PT - End of Session Equipment Utilized During Treatment: Gait belt Activity Tolerance: Patient limited by fatigue Patient left: in bed;with call bell/phone within reach Nurse Communication: Mobility status       Sharion Balloon 11/25/2011, 12:10 PM  Sharion Balloon, SPT Acute Rehab Services [phone]

## 2011-11-25 NOTE — Progress Notes (Signed)
K this am 3.4, creatinine 0.51. 3 runs KCL ordered per protocol. Will continue to monitor.

## 2011-11-25 NOTE — Progress Notes (Signed)
eLink Physician-Brief Progress Note Patient Name: Lauren Morrow DOB: 1924-04-28 MRN: [id number]  Date of Service  11/25/2011   HPI/Events of Note  The patient with tachypnea   eICU Interventions  We will obtain routine labs, troponin, CXR      [facility] 11/25/2011, 4:52 PM

## 2011-11-25 NOTE — Progress Notes (Signed)
PULMONARY  / CRITICAL CARE MEDICINE  Name: Lauren Morrow MRN: [id number] DOB: 05/11/1924    LOS: 4  REFERRING MD :  Josephina Gip    CHIEF COMPLAINT: Post-op respiratory failure  BRIEF PATIENT DESCRIPTION:  78 yo female never smoker admitted on 11/21/2011 for elective repair of AAA.  Developed hypotension/tachycardia post-op in ICU 2nd to retroperitoneal hematoma.  Returned to OR emergently 11/15, and remained on vent post-op.  PCCM consulted 11/15.  Significant PMHx Aortic stenosis s/p bioprosthetic AVR, HTN, PAF, PVD  LINES / TUBES: ETT 11/15>>11/17 Lt Radial aline 11/15>>11/18 Rt IJ CVL 11/15>>  CULTURES: None  ANTIBIOTICS: Vancomycin 11/15>>11/15 Ancef 11/15>>11/15 Cefuroxime 11/15>>11/16  SIGNIFICANT EVENTS:  11/15 retroperitoneal bleeding, post-op VDRF 11/17 BiPAP prn after extubation  LEVEL OF CARE:  ICU PRIMARY SERVICE:  Hart Rochester, [facility] CONSULTANTS:  PCCM CODE STATUS: Full DIET:  NPO DVT Px:  SCD GI Px:  Protonix  INTERVAL HISTORY:  Did not need BiPAP overnight.  Denies chest pain.  Abd still sore, but improved.  VITAL SIGNS: Temp:  [97.5 F (36.4 C)-98.9 F (37.2 C)] 98 F (36.7 C) (11/19 0731) Pulse Rate:  [73-142] 78  (11/19 0700) Resp:  [18-42] 22  (11/19 0700) BP: (132-192)/(58-86) 151/68 mmHg (11/19 0700) SpO2:  [90 %-100 %] 96 % (11/19 0700) Arterial Line BP: (133-172)/(57-70) 133/57 mmHg (11/18 1000) Weight:  [166 lb 14.2 oz (75.7 kg)] 166 lb 14.2 oz (75.7 kg) (11/19 0600)  INTAKE / OUTPUT: Intake/Output      11/18 0701 - 11/19 0700 11/19 0701 - 11/20 0700   I.V. (mL/kg) 460 (6.1)    NG/GT 20    IV Piggyback 100    Total Intake(mL/kg) 580 (7.7)    Urine (mL/kg/hr) 845 (0.5)    Emesis/NG output 25    Stool 1    Total Output 871    Net -291           PHYSICAL EXAMINATION: General:  No distress Neuro:  Awake, follows commands HEENT:  No sinus tenderness Cardiovascular:  s1s2 regular, 2/6 SM Lungs:  Faint b/l expiratory wheeze,  basilar rales Abdomen:  Soft, decreased bowel sounds, wound dressing clean Musculoskeletal:  No edema Skin:  No rashes   LABS:  Lab 11/25/11 0420 11/24/11 0424 11/23/11 1057 11/23/11 0600 11/23/11 0300 11/22/11 1630 11/22/11 0637 11/22/11 0402 11/22/11 0400 11/21/11 1726 11/21/11 1259  HGB 9.7* 10.4* -- -- 12.0 -- -- -- -- -- --  WBC 12.9* 14.4* -- -- 17.0* -- -- -- -- -- --  PLT 98* 77* -- -- 82* -- -- -- -- -- --  NA 142 140 -- -- 140 -- -- -- -- -- --  K 3.4* 3.3* -- -- -- -- -- -- -- -- --  CL 109 105 -- -- 106 -- -- -- -- -- --  CO2 28 26 -- -- 24 -- -- -- -- -- --  GLUCOSE 104* 115* -- -- 133* -- -- -- -- -- --  BUN 17 18 -- -- 18 -- -- -- -- -- --  CREATININE 0.51 0.65 -- -- 0.73 -- -- -- -- -- --  CALCIUM 8.2* 8.1* -- -- 7.7* -- -- -- -- -- --  MG -- -- -- -- 1.7 -- -- -- 1.7 -- 1.4*  PHOS -- -- -- -- -- -- -- -- -- -- --  AST -- -- -- -- -- -- -- -- 38* -- --  ALT -- -- -- -- -- -- -- -- 18 -- --  ALKPHOS -- -- -- -- -- -- -- -- 31* -- --  BILITOT -- -- -- -- -- -- -- -- 0.5 -- --  PROT -- -- -- -- -- -- -- -- 4.3* -- --  ALBUMIN -- -- -- -- -- -- -- -- 2.2* -- --  APTT -- -- -- -- -- -- -- -- -- 29 29  INR -- -- -- -- -- -- -- -- -- 1.33 1.37  LATICACIDVEN -- -- -- -- -- -- -- -- -- -- --  TROPONINI -- -- -- <0.30 <0.30 <0.30 -- -- -- -- --  PROCALCITON -- -- -- -- -- -- -- -- -- -- --  PROBNP -- -- -- -- -- -- -- -- -- -- --  O2SATVEN -- -- -- -- -- -- -- -- -- -- --  PHART -- -- 7.384 -- -- -- 7.335* 7.269* -- -- --  PCO2ART -- -- 39.7 -- -- -- 38.2 47.8* -- -- --  PO2ART -- -- 73.0* -- -- -- 88.0 96.0 -- -- --    Lab 11/22/11 1557 11/22/11 1222 11/22/11 0749  GLUCAP 105* 104* 106*    IMAGING:   DIAGNOSES: Principal Problem:  *S/P AAA repair Active Problems:  Respiratory failure, post-operative  Acute blood loss anemia  Thrombocytopenia due to blood loss  Hypotension  Hypervolemia  Hypokalemia   ASSESSMENT /  PLAN:  PULMONARY  ASSESSMENT: Post-op respiratory failure. Improved. PLAN:   D/c BiPAP Bronchial hygienee F/u CXR as needed Adjust oxygen to keep SpO2 > 92%  CARDIOVASCULAR  ASSESSMENT:  S/p AAA repair. Hypotension 2nd to hypovolemia/hemorrhage. Resolved. Sinus tachycardia. Hx of HTN. Volume overload. 10.8 liters positive since admission. PLAN:  Post-op care per [facility] Lasix 40 mg q8h x 2 doses 11/19 Continue IV lopressor 2.5 mg q8h, and adjust as needed   RENAL  ASSESSMENT:   Hypokalemia. PLAN:   F/u and replace electrolytes as needed while getting lasix  GASTROINTESTINAL  ASSESSMENT:   Nutrition. PLAN:   Advance diet per [facility]  HEMATOLOGIC  ASSESSMENT:   Acute blood loss anemia 2nd to retroperitoneal bleed. Thrombocytopenia. PLAN:  F/u CBC Transfuse for Hb < 7 or bleeding  INFECTIOUS  ASSESSMENT:   No evidence for infection. PLAN:   Monitor clinically  ENDOCRINE  ASSESSMENT:   Hyperglycemia. Likely related to acute stress.  No hx of DM.  PLAN:   Monitor blood sugars on BMET  NEUROLOGIC  ASSESSMENT:   Post-op pain control. PLAN:   Per [facility]  CLINICAL SUMMARY:  No longer needing BiPAP.  WOB still increased, but improving.  Push diuresis as tolerated.  Mobilize as tolerated.  Coralyn Helling, MD [facility] Pulmonary/Critical Care 11/25/2011, 7:58 AM Pager:  [phone] After 3pm call: [phone]

## 2011-11-26 LAB — CBC
Hemoglobin: 9.8 g/dL — ABNORMAL LOW (ref 12.0–15.0)
MCH: 29.6 pg (ref 26.0–34.0)
MCV: 89.4 fL (ref 78.0–100.0)
Platelets: 130 10*3/uL — ABNORMAL LOW (ref 150–400)
RBC: 3.31 MIL/uL — ABNORMAL LOW (ref 3.87–5.11)
WBC: 10.1 10*3/uL (ref 4.0–10.5)

## 2011-11-26 LAB — BASIC METABOLIC PANEL
BUN: 19 mg/dL (ref 6–23)
CO2: 31 mEq/L (ref 19–32)
CO2: 29 mEq/L (ref 19–32)
Chloride: 98 mEq/L (ref 96–112)
Creatinine, Ser: 0.47 mg/dL — ABNORMAL LOW (ref 0.50–1.10)
GFR calc Af Amer: >90 mL/min (ref >90)
Glucose, Bld: 97 mg/dL (ref 70–99)
Potassium: 3 mEq/L — ABNORMAL LOW (ref 3.5–5.1)
Potassium: 4 mEq/L (ref 3.5–5.1)
Sodium: 140 mEq/L (ref 135–145)

## 2011-11-26 LAB — MAGNESIUM: Magnesium: 1.7 mg/dL (ref 1.5–2.5)

## 2011-11-26 MED ORDER — BISACODYL 10 MG RE SUPP
10.0000 mg | Freq: Once | RECTAL | Status: AC
  Administered 2011-11-26: 10 mg via RECTAL
  Filled 2011-11-26: qty 1

## 2011-11-26 MED ORDER — METOPROLOL TARTRATE 1 MG/ML IV SOLN
5.0000 mg | Freq: Four times a day (QID) | INTRAVENOUS | Status: DC
  Administered 2011-11-26 – 2011-11-30 (×15): 5 mg via INTRAVENOUS
  Filled 2011-11-26 (×22): qty 5

## 2011-11-26 MED ORDER — FUROSEMIDE 10 MG/ML IJ SOLN
INTRAMUSCULAR | Status: AC
  Filled 2011-11-26: qty 4

## 2011-11-26 MED ORDER — METOPROLOL TARTRATE 1 MG/ML IV SOLN: 5.0000 mg | INTRAVENOUS | Status: DC | PRN

## 2011-11-26 MED ORDER — POTASSIUM CHLORIDE 10 MEQ/50ML IV SOLN
10.0000 meq | INTRAVENOUS | Status: AC
  Administered 2011-11-26 (×4): 10 meq via INTRAVENOUS
  Filled 2011-11-26: qty 200

## 2011-11-26 MED ORDER — METOPROLOL TARTRATE 1 MG/ML IV SOLN: 5.0000 mg | Freq: Three times a day (TID) | INTRAVENOUS | Status: DC

## 2011-11-26 MED ORDER — BOOST / RESOURCE BREEZE PO LIQD
1.0000 | Freq: Three times a day (TID) | ORAL | Status: DC
  Administered 2011-11-26 – 2011-11-30 (×5): 1 via ORAL

## 2011-11-26 MED ORDER — FUROSEMIDE 10 MG/ML IJ SOLN
20.0000 mg | Freq: Once | INTRAMUSCULAR | Status: AC
  Administered 2011-11-26: 20 mg via INTRAVENOUS

## 2011-11-26 MED ORDER — POTASSIUM CHLORIDE 10 MEQ/50ML IV SOLN
INTRAVENOUS | Status: AC
  Filled 2011-11-26: qty 200

## 2011-11-26 MED ORDER — POTASSIUM CHLORIDE 10 MEQ/50ML IV SOLN
10.0000 meq | INTRAVENOUS | Status: DC
  Administered 2011-11-26 (×4): 10 meq via INTRAVENOUS

## 2011-11-26 NOTE — Progress Notes (Addendum)
Nutrition Follow-up  Intervention:    Add [employer] supplement 3 times daily between meals (250 kcals, 9 gm protein per 8 fl oz carton) RD to follow for nutrition care plan.  Assessment:   Patient extubated 11/17.  BiPAP prn after extubation.  No N/V or diarrhea.  + BM's and flatus.  Per Dr. Candie Chroman note, can start Clear Liquids -- spoke with RN.  RD to place diet order.  Diet Order:  NPO  Meds: Scheduled Meds:   . [COMPLETED] bisacodyl  10 mg Rectal Once  . furosemide      . [COMPLETED] furosemide  20 mg Intravenous Once  . [COMPLETED] furosemide  40 mg Intravenous Q8H  . metoprolol  5 mg Intravenous Q6H  . pantoprazole (PROTONIX) IV  40 mg Intravenous Daily  . [COMPLETED] potassium chloride  10 mEq Intravenous Q1 Hr x 4  . potassium chloride  10 mEq Intravenous Q1 Hr x 4  . [COMPLETED] potassium chloride      . [DISCONTINUED] antiseptic oral rinse  15 mL Mouth Rinse QID  . [DISCONTINUED] chlorhexidine  15 mL Mouth Rinse BID  . [DISCONTINUED] metoprolol  2.5 mg Intravenous Q8H  . [DISCONTINUED] metoprolol  5 mg Intravenous Q8H  . [COMPLETED] potassium chloride  10 mEq Intravenous Q1 Hr x 4   Continuous Infusions:   . lactated ringers 20 mL/hr at 11/23/11 0700   PRN Meds:.acetaminophen, acetaminophen, alum & mag hydroxide-simeth, dextrose 5 % and 0.45 % NaCl with KCl 20 mEq/L, fentaNYL, hydrALAZINE, labetalol, metoprolol, ondansetron, oxyCODONE-acetaminophen, phenol, pneumococcal 23 valent vaccine, potassium chloride, senna-docusate, [DISCONTINUED] bisacodyl   CMP     Component Value Date/Time   NA 140 11/26/2011 0405   K 3.0* 11/26/2011 0405   CL 101 11/26/2011 0405   CO2 31 11/26/2011 0405   GLUCOSE 97 11/26/2011 0405   BUN 19 11/26/2011 0405   CREATININE 0.48* 11/26/2011 0405   CREATININE 0.60 10/23/2011 1030   CALCIUM 8.3* 11/26/2011 0405   PROT 5.8* 11/25/2011 1652   ALBUMIN 2.5* 11/25/2011 1652   AST 23 11/25/2011 1652   ALT 19 11/25/2011 1652   ALKPHOS  65 11/25/2011 1652   BILITOT 1.2 11/25/2011 1652   GFRNONAA 87* 11/26/2011 0405   GFRAA >90 11/26/2011 0405     Intake/Output Summary (Last 24 hours) at 11/26/11 0952 Last data filed at 11/26/11 4098  Gross per 24 hour  Intake    924 ml  Output   4630 ml  Net  -3706 ml    Weight Status:  71.6 kg (11/20) -- fluctuating  Re-estimated needs:  1800-2000 kcals, 90-100 gm protein  New Nutrition Dx:  Increased Nutrient Needs r/t post-op healing as evidenced by estimated nutrition needs, ongoing  Goal:  Oral intake to meet >/= 90% of estimated nutrition needs, currently unmet  Monitor:  PO intake, weight, labs, I/O's  Kirkland Hun, RD, LDN Pager #: [phone] After-Hours Pager #: [phone]

## 2011-11-26 NOTE — Progress Notes (Signed)
Discussed discharge plans w/ pt.  She wants to d/c to SNF close to her family.  Will inform pt's SW & CIR will sign off. [phone]

## 2011-11-26 NOTE — Progress Notes (Signed)
Physical Therapy Treatment Patient Details Name: Lauren Morrow MRN: [id number] DOB: 10/16/1924 Today's Date: 11/26/2011 Time: 1478-2956 PT Time Calculation (min): 30 min  PT Assessment / Plan / Recommendation Comments on Treatment Session  Became dyspneic 3/4 after a long endurance standing task at the sink and walking in the hall.    Follow Up Recommendations  SNF;Supervision/Assistance - 24 hour     Does the patient have the potential to tolerate intense rehabilitation     Barriers to Discharge        Equipment Recommendations  None recommended by PT    Recommendations for Other Services    Frequency Min 3X/week   Plan Discharge plan remains appropriate;Frequency remains appropriate    Precautions / Restrictions Precautions Precautions: Fall Restrictions Weight Bearing Restrictions: No   Pertinent Vitals/Pain Dyspnea 3/4,     Mobility  Bed Mobility Bed Mobility: Supine to Sit;Sitting - Scoot to Edge of Bed Rolling Right: 4: Min assist;With rail Right Sidelying to Sit: 3: Mod assist;HOB flat;With rails Supine to Sit: 4: Min guard;With rails Sitting - Scoot to Edge of Bed: 4: Min guard Details for Bed Mobility Assistance: assist to come forward Transfers Transfers: Sit to Stand;Stand to Sit Sit to Stand: 4: Min assist;With upper extremity assist;From bed Stand to Sit: 4: Min assist;With upper extremity assist;To chair/3-in-1 Details for Transfer Assistance: Pt. requires cues for hand placement.  Stability assist Ambulation/Gait Ambulation/Gait Assistance: 4: Min assist Ambulation Distance (Feet): 65 Feet Assistive device: Rolling walker Ambulation/Gait Assistance Details: short variable steps with flexed posture, need for a little propulsion assist. Notably quick to fatigue with dyspnea and incr RR Gait Pattern: Step-through pattern;Decreased step length - right;Decreased step length - left;Shuffle;Trunk flexed Gait velocity: slowed Stairs: No Wheelchair  Mobility Wheelchair Mobility: No    Exercises General Exercises - Lower Extremity Heel Slides: AROM;Strengthening;Both;10 reps;Supine;Other (comment) (resistive flexion and extension)   PT Diagnosis:    PT Problem List:   PT Treatment Interventions:     PT Goals Acute Rehab PT Goals Time For Goal Achievement: 12/08/11 Potential to Achieve Goals: Good PT Goal: Supine/Side to Sit - Progress: Progressing toward goal PT Goal: Sit to Supine/Side - Progress: Progressing toward goal PT Goal: Sit to Stand - Progress: Progressing toward goal PT Goal: Stand to Sit - Progress: Progressing toward goal PT Goal: Ambulate - Progress: Progressing toward goal  Visit Information  Last PT Received On: 11/26/11 Assistance Needed: +1 PT/OT Co-Evaluation/Treatment: Yes    Subjective Data  Subjective: Get me up out of this bed...when am I going to get back in bed   Cognition  Overall Cognitive Status: Impaired Area of Impairment: Awareness of errors Arousal/Alertness: Awake/alert Orientation Level: Appears intact for tasks assessed Behavior During Session: [facility] for tasks performed Awareness of Errors: Assistance required to identify errors made Awareness of Errors - Other Comments: unaware of safety errors while pushing WC- vc to stand close  Problem Solving: required assist to position comb in her hand to reach different parts of her head/hair Cognition - Other Comments: Pt. slow to process information.  Pt. yelling out to get out of bed,once therapy session was finished, and she was sitting in recliner, she stated, "but I need to get in the bed"    Balance  Balance Balance Assessed: Yes Static Sitting Balance Static Sitting - Balance Support: Feet supported;Left upper extremity supported;Right upper extremity supported Static Sitting - Level of Assistance: 5: Stand by assistance Static Standing Balance Static Standing - Balance Support: Left upper extremity  supported;Right upper extremity  supported;During functional activity Dynamic Standing Balance Dynamic Standing - Balance Support: Left upper extremity supported Dynamic Standing - Level of Assistance: 4: Min assist Dynamic Standing - Balance Activities: Other (comment) (standing task at the sink) Dynamic Standing - Comments: stood ~ 8 mins with dyspnea 3/4  End of Session PT - End of Session Activity Tolerance: Patient limited by fatigue Patient left: in chair;with call bell/phone within reach Nurse Communication: Mobility status   GP     [name], Eliseo Gum 11/26/2011, 4:15 PM  11/26/2011  [name], PT [phone] (pager)

## 2011-11-26 NOTE — Progress Notes (Addendum)
[facility] [place]  Post-op  Intra-abdominal Surgery note  Date of Surgery: 11/21/2011  Surgeon(s): Pryor Ochoa, MD Fransisco Hertz, MD  5 Days Post-Op Procedure(s): EXPLORATORY LAPAROTOMY  History of Present Illness  Lauren Morrow is a 78 y.o. female who is  up s/p Procedure(s): EXPLORATORY LAPAROTOMY Pt is doing well. denies incisional pain; denies nausea/vomiting; denies diarrhea. has had flatus;has had BM  IMAGING: Dg Chest Port 1 View  11/25/2011  *RADIOLOGY REPORT*  Clinical Data: Respiratory distress.  PORTABLE CHEST - 1 VIEW  Comparison: 11/23/2011  Findings: The patient has been extubated and nasogastric and Swan- Ganz catheters removed.  Lungs show some improved aeration at both bases with persistent bibasilar atelectasis/infiltrates remaining as well as small bilateral pleural effusions.  The heart size is stable.  No overt pulmonary edema.  No evidence of pneumothorax.  IMPRESSION: Improved aeration at the lung bases with persistent atelectasis/infiltrates and small bilateral pleural effusions.   Original Report Authenticated By: Irish Lack, M.D.     Significant Diagnostic Studies: CBC Lab Results  Component Value Date   WBC 10.1 11/26/2011   HGB 9.8* 11/26/2011   HCT 29.6* 11/26/2011   MCV 89.4 11/26/2011   PLT 130* 11/26/2011    BMET    Component Value Date/Time   NA 140 11/26/2011 0405   K 3.0* 11/26/2011 0405   CL 101 11/26/2011 0405   CO2 31 11/26/2011 0405   GLUCOSE 97 11/26/2011 0405   BUN 19 11/26/2011 0405   CREATININE 0.48* 11/26/2011 0405   CREATININE 0.60 10/23/2011 1030   CALCIUM 8.3* 11/26/2011 0405   GFRNONAA 87* 11/26/2011 0405   GFRAA >90 11/26/2011 0405    COAG Lab Results  Component Value Date   INR 1.33 11/21/2011   INR 1.37 11/21/2011   INR 1.01 11/14/2011   No results found for this basename: PTT    I/O last 3 completed shifts: In: 1228 [P.O.:90; I.V.:680; IV Piggyback:458] Out: 5296  [Urine:5295; Stool:1]    Physical Examination BP Readings from Last 3 Encounters:  11/26/11 176/74  11/26/11 176/74  11/26/11 176/74   Temp Readings from Last 3 Encounters:  11/26/11 97.9 F (36.6 C) Oral  11/26/11 97.9 F (36.6 C) Oral  11/26/11 97.9 F (36.6 C) Oral   SpO2 Readings from Last 3 Encounters:  11/26/11 97%  11/26/11 97%  11/26/11 97%   Pulse Readings from Last 3 Encounters:  11/26/11 81  11/26/11 81  11/26/11 81    General: A&O x 3, WDWN female in NAD Pulmonary: normal non-labored breathing , without Rales, rhonchi,  wheezing Cardiac: Heart rate : regular ,tachy this am 99  Abdomen:non-tender Abdominal wound:clean, dry, intact.  Bowel sound faint  Neurologic: A&O X 3; Appropriate Affect ; SENSATION: normal; MOTOR FUNCTION:  moving all extremities equally. Speech is fluent/normal  Vascular Exam:BLE warm and well perfused Extremities without ischemic changes, no Gangrene, no cellulitis; no open wounds;  Vascular Exam:BLE warm and well perfused  Extremities without ischemic changes, no Gangrene, no cellulitis; no open wounds;  Doppler DP bilaterally Motor intact, but weak       Assessment/Plan: Lauren Morrow is a 78 y.o. female who is 5 Days Post-Op Procedure(s): Resection and grafting of abdominal aortic aneurysm with return to OR 3 days ago for postoperative bleeding K depletion with replacement via 4 runs IV Hemoglobin stable 9.8 Urine output 4 liters after lasix yesterday Hypertensive this am over the last hour patient was given hydrALAZINE 10 mg labetalol  10 mg, IV BP 177/81 Patient is not asking for pain medication Small sips of water and ice chips.  Bowel sounds faint.       Clinton Gallant [facility] [phone] 11/26/2011 7:38 AM      Agree with above Abdomen soft nontender 3+ femoral pulses well-perfused lower extremities Breathing comfortably with O2 sats 95%  Patient with good diuresis-5 L yesterday-replacing  potassium  Plan out of bed walking in hall today Clear liquid diet today Increase dose Lopressor per ccm  Making good progress

## 2011-11-26 NOTE — Progress Notes (Signed)
PULMONARY  / CRITICAL CARE MEDICINE  Name: Lauren Morrow MRN: [id number] DOB: 1924-06-23    LOS: 5  REFERRING MD :  Josephina Gip    CHIEF COMPLAINT: Post-op respiratory failure  BRIEF PATIENT DESCRIPTION:  78 yo female never smoker admitted on 11/21/2011 for elective repair of AAA.  Developed hypotension/tachycardia post-op in ICU 2nd to retroperitoneal hematoma.  Returned to OR emergently 11/15, and remained on vent post-op.  PCCM consulted 11/15.  Significant PMHx Aortic stenosis s/p bioprosthetic AVR, HTN, PAF, PVD  LINES / TUBES: ETT 11/15>>11/17 Lt Radial aline 11/15>>11/18 Rt IJ CVL 11/15>>  CULTURES: None  ANTIBIOTICS: Vancomycin 11/15>>11/15 Ancef 11/15>>11/15 Cefuroxime 11/15>>11/16  SIGNIFICANT EVENTS:  11/15 retroperitoneal bleeding, post-op VDRF 11/17 BiPAP prn after extubation  LEVEL OF CARE:  ICU PRIMARY SERVICE:  [facility] [place], [facility] CONSULTANTS:  PCCM CODE STATUS: Full DIET:  NPO DVT Px:  SCD GI Px:  Protonix  INTERVAL HISTORY:  Breathing better.  Denies abd pain.  Elevated HR and BP overnight.  VITAL SIGNS: Temp:  [97.3 F (36.3 C)-97.9 F (36.6 C)] 97.8 F (36.6 C) (11/20 0816) Pulse Rate:  [69-105] 105  (11/20 0816) Resp:  [20-40] 25  (11/20 0816) BP: (123-190)/(52-158) 156/71 mmHg (11/20 0816) SpO2:  [90 %-98 %] 92 % (11/20 0816) Weight:  [157 lb 13.6 oz (71.6 kg)] 157 lb 13.6 oz (71.6 kg) (11/20 0500)  INTAKE / OUTPUT: Intake/Output      11/19 0701 - 11/20 0700 11/20 0701 - 11/21 0700   P.O. 90    I.V. (mL/kg) 440 (6.1)    NG/GT     IV Piggyback 358    Total Intake(mL/kg) 888 (12.4)    Urine (mL/kg/hr) 4850 (2.8)    Emesis/NG output     Stool 0    Total Output 4850    Net -3962         Stool Occurrence 2 x      PHYSICAL EXAMINATION: General:  No distress Neuro:  Awake, follows commands HEENT:  No sinus tenderness Cardiovascular:  s1s2 regular, tachycardic, 2/6 SM Lungs:  No wheeze/rales Abdomen:  Soft, decreased bowel  sounds, wound dressing clean Musculoskeletal:  No edema Skin:  No rashes   LABS:  Lab 11/26/11 0405 11/25/11 1652 11/25/11 0420 11/23/11 1057 11/23/11 0600 11/23/11 0300 11/22/11 1630 11/22/11 0637 11/22/11 0402 11/22/11 0400 11/21/11 1726 11/21/11 1259  HGB 9.8* 10.9* 9.7* -- -- -- -- -- -- -- -- --  WBC 10.1 13.8* 12.9* -- -- -- -- -- -- -- -- --  PLT 130* 132* 98* -- -- -- -- -- -- -- -- --  NA 140 137 142 -- -- -- -- -- -- -- -- --  K 3.0* 3.2* -- -- -- -- -- -- -- -- -- --  CL 101 98 109 -- -- -- -- -- -- -- -- --  CO2 31 29 28  -- -- -- -- -- -- -- -- --  GLUCOSE 97 88 104* -- -- -- -- -- -- -- -- --  BUN 19 18 17  -- -- -- -- -- -- -- -- --  CREATININE 0.48* 0.54 0.51 -- -- -- -- -- -- -- -- --  CALCIUM 8.3* 8.4 8.2* -- -- -- -- -- -- -- -- --  MG 1.7 -- -- -- -- 1.7 -- -- -- 1.7 -- --  PHOS -- -- -- -- -- -- -- -- -- -- -- --  AST -- 23 -- -- -- -- -- -- -- 38* -- --  ALT -- 19 -- -- -- -- -- -- -- 18 -- --  ALKPHOS -- 65 -- -- -- -- -- -- -- 31* -- --  BILITOT -- 1.2 -- -- -- -- -- -- -- 0.5 -- --  PROT -- 5.8* -- -- -- -- -- -- -- 4.3* -- --  ALBUMIN -- 2.5* -- -- -- -- -- -- -- 2.2* -- --  APTT -- -- -- -- -- -- -- -- -- -- 29 29  INR -- -- -- -- -- -- -- -- -- -- 1.33 1.37  LATICACIDVEN -- -- -- -- -- -- -- -- -- -- -- --  TROPONINI -- -- -- -- <0.30 <0.30 <0.30 -- -- -- -- --  PROCALCITON -- -- -- -- -- -- -- -- -- -- -- --  PROBNP -- 5825.0* -- -- -- -- -- -- -- -- -- --  O2SATVEN -- -- -- -- -- -- -- -- -- -- -- --  PHART -- -- -- 7.384 -- -- -- 7.335* 7.269* -- -- --  PCO2ART -- -- -- 39.7 -- -- -- 38.2 47.8* -- -- --  PO2ART -- -- -- 73.0* -- -- -- 88.0 96.0 -- -- --    Lab 11/22/11 1557 11/22/11 1222 11/22/11 0749  GLUCAP 105* 104* 106*    IMAGING:   DIAGNOSES: Principal Problem:  *S/P AAA repair Active Problems:  Respiratory failure, post-operative  Acute blood loss anemia  Thrombocytopenia due to blood loss  Hypotension  Hypervolemia   Hypokalemia   ASSESSMENT / PLAN:  PULMONARY  ASSESSMENT: Post-op respiratory failure. Improved. PLAN:   Bronchial hygienee F/u CXR as needed Adjust oxygen to keep SpO2 > 92%  CARDIOVASCULAR  ASSESSMENT:  S/p AAA repair. Hypotension 2nd to hypovolemia/hemorrhage. Resolved. Sinus tachycardia. Hx of HTN. Volume overload. 10.8 liters positive since admission.  Good response to lasix 11/19. PLAN:  Post-op care per [facility] Lasix 20 mg x 1 on 11/20 Change IV lopressor 5 mg q6h   RENAL  ASSESSMENT:   Hypokalemia. PLAN:   F/u and replace electrolytes as needed while getting lasix  GASTROINTESTINAL  ASSESSMENT:   Nutrition. PLAN:   Advance diet per [facility]  HEMATOLOGIC  ASSESSMENT:   Acute blood loss anemia 2nd to retroperitoneal bleed. Thrombocytopenia. PLAN:  F/u CBC Transfuse for Hb < 7 or bleeding  INFECTIOUS  ASSESSMENT:   No evidence for infection. PLAN:   Monitor clinically  ENDOCRINE  ASSESSMENT:   Hyperglycemia. Likely related to acute stress.  No hx of DM.  PLAN:   Monitor blood sugars on BMET  NEUROLOGIC  ASSESSMENT:   Post-op pain control. PLAN:   Per [facility]  CLINICAL SUMMARY:  Improved after diuresis.  Adjust cardiac meds to better control HR and BP.  Defer to [facility] if she can resume SQ heparin for DVT prophylaxis.  Mobilize as tolerated.  Coralyn Helling, MD [facility] 11/26/2011, 8:36 AM Pager:  [phone] After 3pm call: [phone]

## 2011-11-26 NOTE — Progress Notes (Signed)
Occupational Therapy Treatment Patient Details Name: Lauren Morrow MRN: [id number] DOB: 1924-09-24 Today's Date: 11/26/2011 Time: 9562-1308 OT Time Calculation (min): 30 min  OT Assessment / Plan / Recommendation Comments on Treatment Session Pt. with good participation, but very dyspneic today with grooming in standing position and with ambulation.  mild Confusion noted    Follow Up Recommendations  SNF    Barriers to Discharge       Equipment Recommendations  None recommended by OT;None recommended by PT    Recommendations for Other Services    Frequency Min 2X/week   Plan Discharge plan remains appropriate    Precautions / Restrictions Precautions Precautions: Fall Restrictions Weight Bearing Restrictions: No   Pertinent Vitals/Pain     ADL  Grooming: Minimal assistance;Teeth care;Brushing hair (min instructional cues) Where Assessed - Grooming: Supported standing Transfers/Ambulation Related to ADLs: Pt. ambulates with min A, but becomes dyspneic 4/4 ADL Comments: Pt. requires min A to correctly orient comb to hair/head.  And pt. requires cues for sequencing while brushing teeth dyspnea 3/4    OT Diagnosis:    OT Problem List:   OT Treatment Interventions:     OT Goals ADL Goals ADL Goal: Grooming - Progress: Progressing toward goals ADL Goal: Additional Goal #1 - Progress: Progressing toward goals ADL Goal: Additional Goal #2 - Progress: Progressing toward goals  Visit Information  Last OT Received On: 11/26/11 Assistance Needed: +1    Subjective Data      Prior Functioning       Cognition  Overall Cognitive Status: Impaired Area of Impairment: Awareness of errors Arousal/Alertness: Awake/alert Orientation Level: Appears intact for tasks assessed Behavior During Session: [facility] for tasks performed Awareness of Errors: Assistance required to identify errors made Problem Solving: required assist to position comb in her hand to reach different parts  of her head/hair Cognition - Other Comments: Pt. slow to process information.  Pt. yelling out to get out of bed,once therapy session was finished, and she was sitting in recliner, she stated, "but I need to get in the bed"    Mobility  Shoulder Instructions Bed Mobility Bed Mobility: Supine to Sit;Sitting - Scoot to Edge of Bed Supine to Sit: 4: Min guard;With rails Sitting - Scoot to Edge of Bed: 4: Min guard Details for Bed Mobility Assistance: Assist to lift trunk Transfers Transfers: Sit to Stand;Stand to Sit Sit to Stand: 4: Min assist;With upper extremity assist;From bed Stand to Sit: 4: Min assist;With upper extremity assist;To chair/3-in-1 Details for Transfer Assistance: Pt. requires cues for hand placement.  Pt. very dyspneic after ambulating 4/4       Exercises      Balance Balance Balance Assessed: Yes Dynamic Standing Balance Dynamic Standing - Balance Support: Left upper extremity supported Dynamic Standing - Level of Assistance: 4: Min assist Dynamic Standing - Balance Activities: Other (comment) (grooming standing at sink) Dynamic Standing - Comments: stood ~ 8 mins with dyspnea 3/4   End of Session OT - End of Session Activity Tolerance: Patient limited by fatigue Patient left: in chair;with call bell/phone within reach Nurse Communication: Mobility status  GO     Giordan Fordham M 11/26/2011, 3:11 PM

## 2011-11-27 ENCOUNTER — Inpatient Hospital Stay (HOSPITAL_COMMUNITY): Payer: Medicare Other

## 2011-11-27 DIAGNOSIS — R197 Diarrhea, unspecified: Secondary | ICD-10-CM

## 2011-11-27 LAB — BASIC METABOLIC PANEL
CO2: 30 mEq/L (ref 19–32)
Calcium: 8.6 mg/dL (ref 8.4–10.5)
Chloride: 100 mEq/L (ref 96–112)
Glucose, Bld: 121 mg/dL — ABNORMAL HIGH (ref 70–99)
Sodium: 139 mEq/L (ref 135–145)

## 2011-11-27 LAB — CLOSTRIDIUM DIFFICILE BY PCR: Toxigenic C. Difficile by PCR: NEGATIVE

## 2011-11-27 LAB — URINALYSIS, ROUTINE W REFLEX MICROSCOPIC
Glucose, UA: NEGATIVE mg/dL
Specific Gravity, Urine: 1.027 (ref 1.005–1.030)
pH: 6 (ref 5.0–8.0)

## 2011-11-27 LAB — CBC
HCT: 35.7 % — ABNORMAL LOW (ref 36.0–46.0)
MCH: 29.6 pg (ref 26.0–34.0)
MCV: 88.8 fL (ref 78.0–100.0)
RBC: 4.02 MIL/uL (ref 3.87–5.11)
WBC: 13.8 10*3/uL — ABNORMAL HIGH (ref 4.0–10.5)

## 2011-11-27 LAB — URINE MICROSCOPIC-ADD ON

## 2011-11-27 MED ORDER — FUROSEMIDE 10 MG/ML IJ SOLN
INTRAMUSCULAR | Status: AC
  Administered 2011-11-27: 20 mg via INTRAVENOUS
  Filled 2011-11-27: qty 4

## 2011-11-27 MED ORDER — HEPARIN SODIUM (PORCINE) 5000 UNIT/ML IJ SOLN
5000.0000 [IU] | Freq: Two times a day (BID) | INTRAMUSCULAR | Status: DC
  Administered 2011-11-27 – 2011-12-02 (×11): 5000 [IU] via SUBCUTANEOUS
  Filled 2011-11-27 (×12): qty 1

## 2011-11-27 MED ORDER — KCL IN DEXTROSE-NACL 20-5-0.9 MEQ/L-%-% IV SOLN
INTRAVENOUS | Status: DC
  Administered 2011-11-27 – 2011-11-29 (×4): via INTRAVENOUS
  Filled 2011-11-27 (×7): qty 1000

## 2011-11-27 MED ORDER — FUROSEMIDE 10 MG/ML IJ SOLN
20.0000 mg | Freq: Once | INTRAMUSCULAR | Status: AC
  Administered 2011-11-27: 20 mg via INTRAVENOUS

## 2011-11-27 MED ORDER — DEXTROSE 5 % IV SOLN
750.0000 mg | Freq: Three times a day (TID) | INTRAVENOUS | Status: DC
  Administered 2011-11-27 – 2011-11-30 (×8): 750 mg via INTRAVENOUS
  Filled 2011-11-27 (×11): qty 750

## 2011-11-27 MED FILL — Sodium Chloride IV Soln 0.9%: INTRAVENOUS | Qty: 1000 | Status: AC

## 2011-11-27 MED FILL — Sodium Chloride Irrigation Soln 0.9%: Qty: 3000 | Status: AC

## 2011-11-27 MED FILL — Heparin Sodium (Porcine) Inj 1000 Unit/ML: INTRAMUSCULAR | Qty: 30 | Status: AC

## 2011-11-27 NOTE — Progress Notes (Signed)
Physical Therapy Treatment Patient Details Name: Lauren Morrow MRN: [id number] DOB: 12-Jun-1924 Today's Date: 11/27/2011 Time: 5784-6962 PT Time Calculation (min): 29 min  PT Assessment / Plan / Recommendation Comments on Treatment Session  Pt s/p AAA repair on 11/21/11.  Pt met in room today trying to get out of bed to use the bathroom.  She demonstrated improved strength and endurance, however remains limited by fatigue as she is dyspneic shortly into walk.   Will continue to benefit from PT services. Continue to recommend SNF.     Follow Up Recommendations  SNF;Supervision/Assistance - 24 hour        Barriers to Discharge  Decreased caregiver support      Equipment Recommendations  None recommended by PT       Frequency Min 3X/week   Plan Discharge plan remains appropriate;Frequency remains appropriate    Precautions / Restrictions Precautions Precautions: Fall Restrictions Weight Bearing Restrictions: No   Pertinent Vitals/Pain HR: 91 at rest, 97 in sitting prior to walking, 102 upon standing, up to 133 with walking.  RN notified.  O2 sats: >93% throughout session.  Pt maintained O2 sats of 93% on RA for initial bed mobility, and sit to stand.  Returned pt to 2LO2 via  for ambulation.  BP: 137/79 beginning (seated), 135/69 during (standing), 141/61 end of session (seated) Denies pain.    Mobility  Bed Mobility Bed Mobility: Supine to Sit;Sitting - Scoot to Edge of Bed Supine to Sit: 3: Mod assist Sitting - Scoot to Edge of Bed: 4: Min assist Details for Bed Mobility Assistance: pt impatient and slid to side of bed instead of rolling, in haste, she req vc and assist to sit up Transfers Transfers: Sit to Stand;Stand to Sit Sit to Stand: 4: Min assist;With upper extremity assist;From bed Stand to Sit: 4: Min assist;With upper extremity assist;To chair/3-in-1 Details for Transfer Assistance: Pt. requires cues for hand placement.  Stability  assist Ambulation/Gait Ambulation/Gait Assistance: 4: Min assist Ambulation Distance (Feet): 75 Feet Assistive device: Rolling walker Ambulation/Gait Assistance Details: Increased HR to 133 (RN notified), shortness of breath; both relieved with rest.  Pt shuffled and veers left without interest in looking ahead of her.  vc for positioning with RW and posture Gait Pattern: Step-through pattern;Decreased stride length;Shuffle;Trunk flexed Gait velocity: slowed Stairs: No Wheelchair Mobility Wheelchair Mobility: No    Exercises General Exercises - Lower Extremity Long Arc Quad: 5 reps;AROM;Both;Seated     PT Goals Acute Rehab PT Goals PT Goal: Supine/Side to Sit - Progress: Progressing toward goal PT Goal: Sit to Stand - Progress: Progressing toward goal PT Goal: Stand to Sit - Progress: Progressing toward goal PT Goal: Ambulate - Progress: Progressing toward goal  Visit Information  Last PT Received On: 11/27/11 Assistance Needed: +1    Subjective Data  Subjective: "I need to go to the bathroom." Patient Stated Goal: ultimately return home alone   Cognition  Overall Cognitive Status: Impaired Area of Impairment: Attention;Safety/judgement;Awareness of errors Orientation Level: Oriented X4 / Intact Behavior During Session: [facility] for tasks performed Current Attention Level: Focused Attention - Other Comments: distracted by flexiseal causing discomfort for pt, pt stated she wanted to pull it out Safety/Judgement:  (pt unaware of veering left while walking) Awareness of Errors: Assistance required to identify errors made Awareness of Errors - Other Comments: vc to stand close to walker, keep walker straight Cognition - Other Comments: pt appeared cognitively present during the session, however was confused at the end of session asking  where I put her fish.     Balance  Balance Balance Assessed: Yes Static Sitting Balance Static Sitting - Balance Support: Feet  supported;Bilateral upper extremity supported Static Sitting - Level of Assistance: 6: Modified independent (Device/Increase time) Static Sitting - Comment/# of Minutes: EOB without lob, 3-5 min Dynamic Standing Balance Dynamic Standing - Balance Support: Bilateral upper extremity supported;During functional activity Dynamic Standing - Level of Assistance: 4: Min assist Dynamic Standing - Balance Activities:  (walking and manipulating RW)  End of Session PT - End of Session Equipment Utilized During Treatment: Gait belt;Oxygen Activity Tolerance: Patient limited by fatigue Patient left: in chair;with call bell/phone within reach Nurse Communication: Mobility status       Sharion Balloon 11/27/2011, 1:21 PM  Sharion Balloon, SPT Acute Rehab Services [phone]  [facility] Acute Rehabilitation [phone] (pager)

## 2011-11-27 NOTE — Progress Notes (Signed)
ANTIBIOTIC CONSULT NOTE - INITIAL  Pharmacy Consult for antibiotic choice for UTI in a patient with multiple drug allergies Indication: UTI  Allergies  Allergen Reactions  . Avelox (Moxifloxacin Hcl In Nacl) Rash    Avelox began at approximately 0800. At 0807 pt noted to have red-streaking proximal to the PIV site, up the left arm, to the neck/chest area. Avelox immediately discontinued. Pt received about 1/4 the dose or 100mg . Vital signs remained stable. The anesthesiologist and surgeon were notified and shown the site of red, rashy streaking.  Marland Kitchen Penicillins Swelling  . Sulfa Antibiotics Other (See Comments)    unknown    Patient Measurements: Height: 5\' 8"  (172.7 cm) Weight: 152 lb 5.4 oz (69.1 kg) IBW/kg (Calculated) : 63.9   Vital Signs: Temp: 97.6 F (36.4 C) (11/21 1201) Temp src: Axillary (11/21 1201) BP: 115/56 mmHg (11/21 1400) Pulse Rate: 86  (11/21 1400) Intake/Output from previous day: 11/20 0701 - 11/21 0700 In: 500 [I.V.:200; IV Piggyback:300] Out: 2525 [Urine:2525] Intake/Output from this shift: Total I/O In: 492 [I.V.:490; IV Piggyback:2] Out: 585 [Urine:585]  Labs:  [facility] 11/27/11 0420 11/26/11 1500 11/26/11 0405 11/25/11 1652  WBC 13.8* -- 10.1 13.8*  HGB 11.9* -- 9.8* 10.9*  PLT 253 -- 130* 132*  LABCREA -- -- -- --  CREATININE 0.52 0.47* 0.48* --     Microbiology: Recent Results (from the past 720 hour(s))  SURGICAL PCR SCREEN     Status: Normal   Collection Time   11/14/11  1:50 PM      Component Value Range Status Comment   MRSA, PCR NEGATIVE  NEGATIVE Final    Staphylococcus aureus NEGATIVE  NEGATIVE Final   URINE CULTURE     Status: Normal   Collection Time   11/14/11  1:50 PM      Component Value Range Status Comment   Specimen Description URINE, CLEAN CATCH   Final    Special Requests NONE   Final    Culture  Setup Time 11/14/2011 15:23   Final    Colony Count >=100,000 COLONIES/ML   Final    Culture KLEBSIELLA PNEUMONIAE    Final    Report Status 11/16/2011 FINAL   Final    Organism ID, Bacteria KLEBSIELLA PNEUMONIAE   Final   CLOSTRIDIUM DIFFICILE BY PCR     Status: Normal   Collection Time   11/27/11  7:47 AM      Component Value Range Status Comment   C difficile by pcr NEGATIVE  NEGATIVE Final     Medical History: Past Medical History  Diagnosis Date  . Varicose veins   . Seasonal allergies   . Osteomyelitis     history of osteomyelistis in the leg  . Aortic stenosis     Dr. Terrilee Files, saw last 09/11/11  . Dizziness   . Peripheral vascular disease   . Hypertension     sees Dr. Sherril Croon, primary  . Atrial fibrillation     Post op, sees  Dr. Kirke Corin  . Anxiety   . Shortness of breath     "gets short of breath at time since the heart surgery 2012"  . Urinary tract infection     hx of  . Kidney stones     hx of  . GERD (gastroesophageal reflux disease)   . Arthritis   . Anemia     hx of  . Low back pain   . Cataracts, bilateral     Medications:  Prescriptions prior to admission  Medication Sig Dispense Refill  . aspirin EC 325 MG tablet Take 1 tablet (325 mg total) by mouth daily.  30 tablet  0  . b complex vitamins tablet Take 1 tablet by mouth daily.      . calcium-vitamin D (OSCAL WITH D) 500-200 MG-UNIT per tablet Take 1 tablet by mouth 2 (two) times daily.        . Cholecalciferol (VITAMIN D) 1000 UNITS capsule Take 1,000 Units by mouth daily.      . fish oil-omega-3 fatty acids 1000 MG capsule Take 1 g by mouth daily.        . meclizine (ANTIVERT) 25 MG tablet Take 25 mg by mouth as needed.      . metoprolol tartrate (LOPRESSOR) 25 MG tablet Take 25 mg by mouth 2 (two) times daily.      . Multiple Vitamin (MULTIVITAMIN) tablet Take 1 tablet by mouth daily.        Marland Kitchen omeprazole (PRILOSEC) 20 MG capsule Take 20 mg by mouth daily.      . sertraline (ZOLOFT) 50 MG tablet Take 50 mg by mouth daily.       . diphenhydrAMINE (BENADRYL) 12.5 MG/5ML elixir Take 10 mLs (25 mg total) by mouth at  bedtime as needed for sleep.  120 mL  0   Assessment: 78 year old woman s/p AAA repair on 11/21/11 now with UTI.  Cultures pending.  The patient had a urine culture with Klebsiella pneumoniae on 11/8 which was sensitive to all cephalosporins if the offending organism is sensitive.  Goal of Therapy:  treat infection  Plan:  Ms. Yorke received cefuroxime perioperatively without reaction.  Discussed with Della Goo - will start cefuroxime 750mg  IV q8h now.  Can change to cefuroxime 250mg  po BID when able to take po.  Will follow renal function and cultures with surgery team.  Mickeal Skinner 11/27/2011,3:17 PM

## 2011-11-27 NOTE — Progress Notes (Signed)
PULMONARY  / CRITICAL CARE MEDICINE  Name: Lauren Morrow MRN: [id number] DOB: 05-Apr-1924    LOS: 6  REFERRING MD :  Josephina Gip    CHIEF COMPLAINT: Post-op respiratory failure  BRIEF PATIENT DESCRIPTION:  78 yo female never smoker admitted on 11/21/2011 for elective repair of AAA.  Developed hypotension/tachycardia post-op in ICU 2nd to retroperitoneal hematoma.  Returned to OR emergently 11/15, and remained on vent post-op.  PCCM consulted 11/15.  Significant PMHx Aortic stenosis s/p bioprosthetic AVR, HTN, PAF, PVD  LINES / TUBES: ETT 11/15>>11/17 Lt Radial aline 11/15>>11/18 Rt IJ CVL 11/15>>  CULTURES: C diff PCR 11/21>>  ANTIBIOTICS: Vancomycin 11/15>>11/15 Ancef 11/15>>11/15 Cefuroxime 11/15>>11/16  SIGNIFICANT EVENTS:  11/15 retroperitoneal bleeding, post-op VDRF 11/17 BiPAP prn after extubation 11/21 Diarrhea  LEVEL OF CARE:  ICU PRIMARY SERVICE:  Hart Rochester, [facility] CONSULTANTS:  PCCM CODE STATUS: Full DIET:  NPO DVT Px:  SQ heparin GI Px:  Protonix  INTERVAL HISTORY:  Abdomin bloated.  Frequent, loose stools overnight.  VITAL SIGNS: Temp:  [97.4 F (36.3 C)-98 F (36.7 C)] 97.5 F (36.4 C) (11/21 0731) Pulse Rate:  [77-116] 94  (11/21 0800) Resp:  [19-36] 33  (11/21 0800) BP: (132-189)/(66-112) 135/69 mmHg (11/21 0800) SpO2:  [92 %-97 %] 96 % (11/21 0800) Weight:  [152 lb 5.4 oz (69.1 kg)] 152 lb 5.4 oz (69.1 kg) (11/21 0500)  INTAKE / OUTPUT: Intake/Output      11/20 0701 - 11/21 0700 11/21 0701 - 11/22 0700   P.O.     I.V. (mL/kg) 200 (2.9) 40 (0.6)   IV Piggyback 300    Total Intake(mL/kg) 500 (7.2) 40 (0.6)   Urine (mL/kg/hr) 2525 (1.5) 60   Total Output 2525 60   Net -2025 -20          PHYSICAL EXAMINATION: General:  No distress Neuro:  Awake, follows commands HEENT:  No sinus tenderness Cardiovascular:  s1s2 regular, tachycardic, 2/6 SM Lungs:  No wheeze/rales Abdomen:  Soft, mild distention Musculoskeletal:  No edema Skin:  No  rashes   LABS:  Lab 11/27/11 0420 11/26/11 1500 11/26/11 0405 11/25/11 1652 11/23/11 1057 11/23/11 0600 11/23/11 0300 11/22/11 1630 11/22/11 0637 11/22/11 0402 11/22/11 0400 11/21/11 1726 11/21/11 1259  HGB 11.9* -- 9.8* 10.9* -- -- -- -- -- -- -- -- --  WBC 13.8* -- 10.1 13.8* -- -- -- -- -- -- -- -- --  PLT 253 -- 130* 132* -- -- -- -- -- -- -- -- --  NA 139 137 140 -- -- -- -- -- -- -- -- -- --  K 3.4* 4.0 -- -- -- -- -- -- -- -- -- -- --  CL 100 98 101 -- -- -- -- -- -- -- -- -- --  CO2 30 29 31  -- -- -- -- -- -- -- -- -- --  GLUCOSE 121* 109* 97 -- -- -- -- -- -- -- -- -- --  BUN 19 19 19  -- -- -- -- -- -- -- -- -- --  CREATININE 0.52 0.47* 0.48* -- -- -- -- -- -- -- -- -- --  CALCIUM 8.6 8.9 8.3* -- -- -- -- -- -- -- -- -- --  MG -- -- 1.7 -- -- -- 1.7 -- -- -- 1.7 -- --  PHOS -- -- -- -- -- -- -- -- -- -- -- -- --  AST -- -- -- 23 -- -- -- -- -- -- 38* -- --  ALT -- -- -- 19 -- -- -- -- -- --  18 -- --  ALKPHOS -- -- -- 65 -- -- -- -- -- -- 31* -- --  BILITOT -- -- -- 1.2 -- -- -- -- -- -- 0.5 -- --  PROT -- -- -- 5.8* -- -- -- -- -- -- 4.3* -- --  ALBUMIN -- -- -- 2.5* -- -- -- -- -- -- 2.2* -- --  APTT -- -- -- -- -- -- -- -- -- -- -- 29 29  INR -- -- -- -- -- -- -- -- -- -- -- 1.33 1.37  LATICACIDVEN -- -- -- -- -- -- -- -- -- -- -- -- --  TROPONINI -- -- -- -- -- <0.30 <0.30 <0.30 -- -- -- -- --  PROCALCITON -- -- -- -- -- -- -- -- -- -- -- -- --  PROBNP -- -- -- 5825.0* -- -- -- -- -- -- -- -- --  O2SATVEN -- -- -- -- -- -- -- -- -- -- -- -- --  PHART -- -- -- -- 7.384 -- -- -- 7.335* 7.269* -- -- --  PCO2ART -- -- -- -- 39.7 -- -- -- 38.2 47.8* -- -- --  PO2ART -- -- -- -- 73.0* -- -- -- 88.0 96.0 -- -- --    Lab 11/22/11 1557 11/22/11 1222 11/22/11 0749  GLUCAP 105* 104* 106*    IMAGING: AXR 11/21 >>gas in large/small bowel w/o significant distention  DIAGNOSES: Principal Problem:  *S/P AAA repair Active Problems:  Respiratory failure, post-operative   Acute blood loss anemia  Thrombocytopenia due to blood loss  Hypotension  Hypervolemia  Hypokalemia   ASSESSMENT / PLAN:  PULMONARY  ASSESSMENT: Post-op respiratory failure. Improved. PLAN:   Bronchial hygienee F/u CXR as needed Adjust oxygen to keep SpO2 > 92%  CARDIOVASCULAR  ASSESSMENT:  S/p AAA repair. Hypotension 2nd to hypovolemia/hemorrhage. Resolved. Sinus tachycardia. Improved with increased dose of lopressor. Hx of HTN. Volume overload. 10.8 liters positive since admission.  Good response to lasix 11/19 and 11/20. PLAN:  Post-op care per [facility] Lasix 20 mg x 1 on 11/21 Continue IV lopressor 5 mg q6h until able to take oral meds   RENAL  ASSESSMENT:   Hypokalemia. PLAN:   F/u and replace electrolytes as needed while getting lasix  GASTROINTESTINAL  ASSESSMENT:   Nutrition. Diarrhea. New 11/21. PLAN:   NPO Flexiseal in place  HEMATOLOGIC  ASSESSMENT:   Acute blood loss anemia 2nd to retroperitoneal bleed. No further evidence for bleeding. Thrombocytopenia. Resolved 11/21. PLAN:  F/u CBC Add SQ heparin for DVT prophylaxis Transfuse for Hb < 7 or bleeding  INFECTIOUS  ASSESSMENT:   Doubt diarrhea is related to infection. PLAN:   F/u stool C diff PCR >> hold off on adding flagyl for now  ENDOCRINE  ASSESSMENT:   Hyperglycemia. Likely related to acute stress.  No hx of DM.   Resolved. PLAN:   Monitor blood sugars on BMET  NEUROLOGIC  ASSESSMENT:   Post-op pain control. PLAN:   Per [facility]  CLINICAL SUMMARY:  Will need SNF placement.  Coralyn Helling, MD [facility] Pulmonary/Critical Care 11/27/2011, 8:58 AM Pager:  [phone] After 3pm call: [phone]

## 2011-11-27 NOTE — Progress Notes (Addendum)
[facility]  Post-op  Intra-abdominal Surgery note  Date of Surgery: 11/21/2011  Surgeon(s): Pryor Ochoa, MD Fransisco Hertz, MD  6 Days Post-Op - AAA repair  History of Present Illness  Lauren Morrow is a 78 y.o. female who is s/p AAA repair. Pt abdomen more distended overnight, No [place], NT, large amt loose stools requiring flexceal. WBC up. afebrile  Pt is doing well. complains of mild incisional pain; denies nausea/vomiting; complains of diarrhea. has had flatus;has had BM  IMAGING: Dg Chest Port 1 View  11/25/2011  *RADIOLOGY REPORT*  Clinical Data: Respiratory distress.  PORTABLE CHEST - 1 VIEW  Comparison: 11/23/2011  Findings: The patient has been extubated and nasogastric and Swan- Ganz catheters removed.  Lungs show some improved aeration at both bases with persistent bibasilar atelectasis/infiltrates remaining as well as small bilateral pleural effusions.  The heart size is stable.  No overt pulmonary edema.  No evidence of pneumothorax.  IMPRESSION: Improved aeration at the lung bases with persistent atelectasis/infiltrates and small bilateral pleural effusions.   Original Report Authenticated By: Irish Lack, M.D.     Significant Diagnostic Studies: CBC Lab Results  Component Value Date   WBC 13.8* 11/27/2011   HGB 11.9* 11/27/2011   HCT 35.7* 11/27/2011   MCV 88.8 11/27/2011   PLT 253 11/27/2011    BMET    Component Value Date/Time   NA 139 11/27/2011 0420   K 3.4* 11/27/2011 0420   CL 100 11/27/2011 0420   CO2 30 11/27/2011 0420   GLUCOSE 121* 11/27/2011 0420   BUN 19 11/27/2011 0420   CREATININE 0.52 11/27/2011 0420   CREATININE 0.60 10/23/2011 1030   CALCIUM 8.6 11/27/2011 0420   GFRNONAA 85* 11/27/2011 0420   GFRAA >90 11/27/2011 0420    COAG Lab Results  Component Value Date   INR 1.33 11/21/2011   INR 1.37 11/21/2011   INR 1.01 11/14/2011   No results found for this basename: PTT    I/O last 3  completed shifts: In: 840 [I.V.:440; IV Piggyback:400] Out: 3175 [Urine:3175]    Physical Examination BP Readings from Last 3 Encounters:  11/27/11 139/67  11/27/11 139/67  11/27/11 139/67   Temp Readings from Last 3 Encounters:  11/27/11 97.5 F (36.4 C) Oral  11/27/11 97.5 F (36.4 C) Oral  11/27/11 97.5 F (36.4 C) Oral   SpO2 Readings from Last 3 Encounters:  11/27/11 96%  11/27/11 96%  11/27/11 96%   Pulse Readings from Last 3 Encounters:  11/27/11 90  11/27/11 90  11/27/11 90    General: A&O x 3, WDWN female in NAD Pulmonary: normal non-labored breathing , without Rales, rhonchi,  wheezing Cardiac: Heart rate : regular ,  Abdomen:distended no BS noted, Non-tender  Abdominal wound:clean, dry, intact  Neurologic: A&O X 3; Appropriate Affect ; SENSATION: normal; MOTOR FUNCTION:  moving all extremities equally. Speech is fluent/normal  Vascular Exam:BLE warm and well perfused Extremities without ischemic changes, no Gangrene, no cellulitis; no open wounds;    Assessment/Plan: Lauren Morrow is a 78 y.o. female who is 6 Days Post-Op Procedure(s): AAA repair Abd distended - KUB shows large amt of air in stomach and throughout bowel. No distended loops WBC increased, pt afebrile Will add CPK to labs and check UA Diarrhea without cramping or blood noted - will send C.Diff to R/O Make pt NPO for now - restart IVF Continue foley for strict I/O Hypokalemia - replace  [place]e [place] [phone] 11/27/2011 7:35  AM  Agree with above Abdomen nontender but mildly distended Diarrhea but no evidence of blood Hemoglobin and hematocrit stable-slight elevation of white count No nausea and vomiting  We'll keep n.p.o. this a.m. and get stool culture for C. difficile Continued to mobilize patient and when ready to resume diet will go straight to full liquids Good diuresis yesterday with Lasix Will repeat today and replace potassium   Patient requests SNF rather  than CIR

## 2011-11-28 ENCOUNTER — Inpatient Hospital Stay (HOSPITAL_COMMUNITY): Payer: Medicare Other

## 2011-11-28 DIAGNOSIS — R0603 Acute respiratory distress: Secondary | ICD-10-CM

## 2011-11-28 DIAGNOSIS — R0609 Other forms of dyspnea: Secondary | ICD-10-CM

## 2011-11-28 DIAGNOSIS — R197 Diarrhea, unspecified: Secondary | ICD-10-CM

## 2011-11-28 DIAGNOSIS — R0989 Other specified symptoms and signs involving the circulatory and respiratory systems: Secondary | ICD-10-CM

## 2011-11-28 DIAGNOSIS — G9340 Encephalopathy, unspecified: Secondary | ICD-10-CM

## 2011-11-28 DIAGNOSIS — I059 Rheumatic mitral valve disease, unspecified: Secondary | ICD-10-CM

## 2011-11-28 LAB — BASIC METABOLIC PANEL
BUN: 17 mg/dL (ref 6–23)
CO2: 30 mEq/L (ref 19–32)
Chloride: 102 mEq/L (ref 96–112)
Creatinine, Ser: 0.48 mg/dL — ABNORMAL LOW (ref 0.50–1.10)
Glucose, Bld: 120 mg/dL — ABNORMAL HIGH (ref 70–99)
Potassium: 2.8 mEq/L — ABNORMAL LOW (ref 3.5–5.1)

## 2011-11-28 LAB — EXPECTORATED SPUTUM ASSESSMENT W GRAM STAIN, RFLX TO RESP C

## 2011-11-28 LAB — CBC
HCT: 32.2 % — ABNORMAL LOW (ref 36.0–46.0)
Hemoglobin: 10.8 g/dL — ABNORMAL LOW (ref 12.0–15.0)
MCH: 30.5 pg (ref 26.0–34.0)
MCHC: 33.5 g/dL (ref 30.0–36.0)
MCV: 91 fL (ref 78.0–100.0)
RDW: 15.3 % (ref 11.5–15.5)

## 2011-11-28 LAB — BLOOD GAS, ARTERIAL
Drawn by: 32526
O2 Content: 2 L/min
O2 Saturation: 97.9 %
pCO2 arterial: 38.9 mmHg (ref 35.0–45.0)
pH, Arterial: 7.465 — ABNORMAL HIGH (ref 7.350–7.450)
pO2, Arterial: 81.3 mmHg (ref 80.0–100.0)

## 2011-11-28 MED ORDER — FUROSEMIDE 10 MG/ML IJ SOLN
20.0000 mg | Freq: Once | INTRAMUSCULAR | Status: AC
  Administered 2011-11-28: 20 mg via INTRAVENOUS
  Filled 2011-11-28: qty 2

## 2011-11-28 MED ORDER — POTASSIUM CHLORIDE 10 MEQ/50ML IV SOLN
10.0000 meq | INTRAVENOUS | Status: AC
  Administered 2011-11-28 (×4): 10 meq via INTRAVENOUS
  Filled 2011-11-28: qty 150

## 2011-11-28 NOTE — Progress Notes (Signed)
PULMONARY  / CRITICAL CARE MEDICINE  Name: Lauren Morrow MRN: [id number] DOB: 01/27/1924    LOS: 7  REFERRING MD :  Josephina Gip    CHIEF COMPLAINT: Post-op respiratory failure  BRIEF PATIENT DESCRIPTION:  78 yo female never smoker admitted on 11/21/2011 for elective repair of AAA.  Developed hypotension/tachycardia post-op in ICU 2nd to retroperitoneal hematoma.  Returned to OR emergently 11/15, and remained on vent post-op.  PCCM consulted 11/15.  Significant PMHx Aortic stenosis s/p bioprosthetic AVR, HTN, PAF, PVD  LINES / TUBES: ETT 11/15>>11/17 Lt Radial aline 11/15>>11/18 Rt IJ CVL 11/15>>  CULTURES: Results for orders placed during the hospital encounter of 11/21/11  URINE CULTURE     Status: Normal   Collection Time   11/14/11  1:50 PM      Component Value Range Status Comment   Specimen Description URINE, CLEAN CATCH   Final    Special Requests NONE   Final    Culture  Setup Time 11/14/2011 15:23   Final    Colony Count >=100,000 COLONIES/ML   Final    Culture KLEBSIELLA PNEUMONIAE   Final    Report Status 11/16/2011 FINAL   Final    Organism ID, Bacteria KLEBSIELLA PNEUMONIAE   Final   CLOSTRIDIUM DIFFICILE BY PCR     Status: Normal   Collection Time   11/27/11  7:47 AM      Component Value Range Status Comment   C difficile by pcr NEGATIVE  NEGATIVE Final    No results found for this basename: PROCALCITON:5 in the last 168 hours   ANTIBIOTICS: Vancomycin 11/15>>11/15 Ancef 11/15>>11/15 Cefuroxime 11/15>>11/16  Anti-infectives     Start     Dose/Rate Route Frequency Ordered Stop   11/27/11 1600   cefUROXime (ZINACEF) 750 mg in dextrose 5 % 50 mL IVPB        750 mg 100 mL/hr over 30 Minutes Intravenous 3 times per day 11/27/11 1516     11/22/11 0000   cefUROXime (ZINACEF) 1.5 g in dextrose 5 % 50 mL IVPB        1.5 g 100 mL/hr over 30 Minutes Intravenous Every 12 hours 11/21/11 2112 11/22/11 1223   11/20/11 1419   vancomycin (VANCOCIN) IVPB 1000  mg/200 mL premix        1,000 mg 200 mL/hr over 60 Minutes Intravenous 120 min pre-op 11/20/11 1419 11/21/11 0752            SIGNIFICANT EVENTS:  11/15 retroperitoneal bleeding, post-op VDRF 11/17 BiPAP prn after extubation 11/21 Diarrhea - c diff engative  LEVEL OF CARE:  ICU PRIMARY SERVICE:  Hart Rochester, [facility] CONSULTANTS:  PCCM CODE STATUS: Full DIET:  NPO DVT Px:  SQ heparin GI Px:  Protonix  INTERVAL HISTORY:  11/28/11- confusion continues. Some increase in resp distress  VITAL SIGNS: Temp:  [97.3 F (36.3 C)-98.1 F (36.7 C)] 97.3 F (36.3 C) (11/22 0838) Pulse Rate:  [69-120] 69  (11/22 0900) Resp:  [22-36] 22  (11/22 0900) BP: (115-185)/(54-79) 171/69 mmHg (11/22 0900) SpO2:  [93 %-100 %] 97 % (11/22 0900) Weight:  [70.761 kg (156 lb)] 70.761 kg (156 lb) (11/22 0100)  INTAKE / OUTPUT: Intake/Output      11/21 0701 - 11/22 0700 11/22 0701 - 11/23 0700   I.V. (mL/kg) 1690 (23.9) 225 (3.2)   IV Piggyback 152 50   Total Intake(mL/kg) 1842 (26) 275 (3.9)   Urine (mL/kg/hr) 1115 (0.7) 60   Total Output 1115 60  Net +727 +215          PHYSICAL EXAMINATION: General:  Chronically unwell loking Neuro:  Awake, follows commands but confused pleasantly. RASS +1. CAM-ICU positive for delirum. Moves all 4s HEENT:  No sinus tenderness Cardiovascular:  s1s2 regular, tachycardic, 2/6 SM Lungs:  No wheeze/rales but mild purse lip breathing +. Expiration prolonged. Mildly tachypneic + Abdomen:  Soft, mild distention Musculoskeletal:  No edema Skin:  No rashes   LABS:  Lab 11/28/11 0520 11/27/11 0420 11/26/11 1500 11/26/11 0405 11/25/11 1652 11/23/11 1057 11/23/11 0600 11/23/11 0300 11/22/11 1630 11/22/11 0637 11/22/11 0402 11/22/11 0400 11/21/11 1726 11/21/11 1259  HGB 10.8* 11.9* -- 9.8* -- -- -- -- -- -- -- -- -- --  WBC 10.3 13.8* -- 10.1 -- -- -- -- -- -- -- -- -- --  PLT 273 253 -- 130* -- -- -- -- -- -- -- -- -- --  NA 138 139 137 -- -- -- -- -- -- -- -- -- --  --  K 2.8* 3.4* -- -- -- -- -- -- -- -- -- -- -- --  CL 102 100 98 -- -- -- -- -- -- -- -- -- -- --  CO2 30 30 29  -- -- -- -- -- -- -- -- -- -- --  GLUCOSE 120* 121* 109* -- -- -- -- -- -- -- -- -- -- --  BUN 17 19 19  -- -- -- -- -- -- -- -- -- -- --  CREATININE 0.48* 0.52 0.47* -- -- -- -- -- -- -- -- -- -- --  CALCIUM 8.1* 8.6 8.9 -- -- -- -- -- -- -- -- -- -- --  MG -- -- -- 1.7 -- -- -- 1.7 -- -- -- 1.7 -- --  PHOS -- -- -- -- -- -- -- -- -- -- -- -- -- --  AST -- -- -- -- 23 -- -- -- -- -- -- 38* -- --  ALT -- -- -- -- 19 -- -- -- -- -- -- 18 -- --  ALKPHOS -- -- -- -- 65 -- -- -- -- -- -- 31* -- --  BILITOT -- -- -- -- 1.2 -- -- -- -- -- -- 0.5 -- --  PROT -- -- -- -- 5.8* -- -- -- -- -- -- 4.3* -- --  ALBUMIN -- -- -- -- 2.5* -- -- -- -- -- -- 2.2* -- --  APTT -- -- -- -- -- -- -- -- -- -- -- -- 29 29  INR -- -- -- -- -- -- -- -- -- -- -- -- 1.33 1.37  LATICACIDVEN -- -- -- -- -- -- -- -- -- -- -- -- -- --  TROPONINI -- -- -- -- -- -- <0.30 <0.30 <0.30 -- -- -- -- --  PROCALCITON -- -- -- -- -- -- -- -- -- -- -- -- -- --  PROBNP -- -- -- -- 5825.0* -- -- -- -- -- -- -- -- --  O2SATVEN -- -- -- -- -- -- -- -- -- -- -- -- -- --  PHART -- -- -- -- -- 7.384 -- -- -- 7.335* 7.269* -- -- --  PCO2ART -- -- -- -- -- 39.7 -- -- -- 38.2 47.8* -- -- --  PO2ART -- -- -- -- -- 73.0* -- -- -- 88.0 96.0 -- -- --    Lab 11/22/11 1557 11/22/11 1222 11/22/11 0749  GLUCAP 105* 104* 106*    IMAGING: AXR 11/21 >>gas in large/small bowel w/o  significant distention  Dg Abd Portable 1v  11/27/2011  *RADIOLOGY REPORT*  Clinical Data: Abdominal distention  PORTABLE ABDOMEN - 1 VIEW  Comparison: Portable abdomen of 11/21/2011  Findings: Both large and small bowel gas is present without significant distention.  The NG tube has been removed.  Surgical staples overlie the midline of the abdomen and pelvis.  Lumbar scoliosis convex to the right is noted with diffuse degenerative change.  IMPRESSION:  Both large and small bowel gas is present without significant distention.   Original Report Authenticated By: Dwyane Dee, M.D.      DIAGNOSES: Principal Problem:  *S/P AAA repair Active Problems:  Respiratory failure, post-operative  Acute blood loss anemia  Thrombocytopenia due to blood loss  Hypotension  Hypervolemia  Hypokalemia  Diarrhea   ASSESSMENT / PLAN:  PULMONARY  ASSESSMENT: Post-op respiratory failure.  On 11/28/11 - having green sputum and mild resp disterss  PLAN:   Sputum culture Check cxr Check abg Check bnp Check echo  CARDIOVASCULAR  ASSESSMENT:  S/p AAA repair. Hypotension 2nd to hypovolemia/hemorrhage. Resolved. Sinus tachycardia. Improved with increased dose of lopressor. Hx of HTN. Volume overload. 10.8 liters positive since admission.  Good response to lasix 11/19 and 11/20 and 11/27/11    - on 11/28/11 - mild resp distress  PLAN:  Post-op care per [facility] Lasix 20 mg x 1 on 11/22 Continue IV lopressor 5 mg q6h until able to take oral meds Check bnp for 11/29/11 Check ECHO on 11/28/11   RENAL  ASSESSMENT:   Hypokalemia. PLAN:   F/u and replace electrolytes as needed while getting lasix  GASTROINTESTINAL  ASSESSMENT:   Nutrition. Diarrhea. New 11/21. C Diff negative  PLAN:   NPO Flexiseal in place Monitor  HEMATOLOGIC  ASSESSMENT:   Acute blood loss anemia 2nd to retroperitoneal bleed. No further evidence for bleeding. Thrombocytopenia. Resolved 11/21. PLAN:  F/u CBC  SQ heparin for DVT prophylaxis Transfuse for Hb < 7 or bleeding  INFECTIOUS  ASSESSMENT:   Doubt diarrhea is related to infection. C diff engative PLAN:   Monitor Keep K > 4  ENDOCRINE  ASSESSMENT:   Hyperglycemia. Likely related to acute stress.  No hx of DM.   Resolved. PLAN:   Monitor blood sugars on BMET  NEUROLOGIC  ASSESSMENT:   Post-op pain control.  - on 11/28/11 - mild delirium + PLAN:   Monitor Keep in icu  CLINICAL  SUMMARY:  Will need SNF placement. Keep in ICU due to delirium and mild resp disterss    Dr. Kalman Shan, M.D., [facility].C.P Pulmonary and Critical Care Medicine Staff Physician [facility] Pulmonary and Critical Care Pager: [phone], If no answer or between  15:00h - 7:00h: call [phone]  11/28/2011 10:33 AM

## 2011-11-28 NOTE — Progress Notes (Signed)
VASCULAR PROGRESS NOTE  SUBJECTIVE: Comfortable. No specific complaints. Denies nausea. She thinks that she has passed some flatus.  PHYSICAL EXAM: Filed Vitals:   11/28/11 0436 11/28/11 0500 11/28/11 0600 11/28/11 0700  BP:    159/76  Pulse:  70 74 74  Temp: 97.3 F (36.3 C)     TempSrc: Oral     Resp:  23 26 27   Height:      Weight:      SpO2:  96% 98% 100%   Lungs clear. Abdomen slightly distended with hypoactive bowel sounds. Feet are warm and well-perfused.  LABS: Lab Results  Component Value Date   WBC 10.3 11/28/2011   HGB 10.8* 11/28/2011   HCT 32.2* 11/28/2011   MCV 91.0 11/28/2011   PLT 273 11/28/2011   Lab Results  Component Value Date   CREATININE 0.48* 11/28/2011   Lab Results  Component Value Date   INR 1.33 11/21/2011   C diff negative  ASSESSMENT/PLAN: 1. 7 Days Post-Op s/p: repair of abdominal aortic aneurysm. 2. Mobilize. 3. Sips full liquids. 4. Acute blood loss anemia resolved. 5. Renal function normal  Cari Caraway Beeper: [phone] 11/28/2011

## 2011-11-28 NOTE — Progress Notes (Signed)
Echocardiogram 2D Echocardiogram has been performed.  Lauren Morrow 11/28/2011, 11:52 AM

## 2011-11-29 ENCOUNTER — Inpatient Hospital Stay (HOSPITAL_COMMUNITY): Payer: Medicare Other

## 2011-11-29 LAB — CBC
Hemoglobin: 10.4 g/dL — ABNORMAL LOW (ref 12.0–15.0)
MCHC: 32.4 g/dL (ref 30.0–36.0)
RDW: 15.7 % — ABNORMAL HIGH (ref 11.5–15.5)
WBC: 10.2 10*3/uL (ref 4.0–10.5)

## 2011-11-29 LAB — PHOSPHORUS: Phosphorus: 2.7 mg/dL (ref 2.3–4.6)

## 2011-11-29 LAB — BASIC METABOLIC PANEL
BUN: 14 mg/dL (ref 6–23)
GFR calc Af Amer: >90 mL/min (ref >90)
GFR calc non Af Amer: 88 mL/min — ABNORMAL LOW (ref >90)
Potassium: 3.2 mEq/L — ABNORMAL LOW (ref 3.5–5.1)

## 2011-11-29 LAB — MAGNESIUM: Magnesium: 1.6 mg/dL (ref 1.5–2.5)

## 2011-11-29 MED ORDER — METOCLOPRAMIDE HCL 5 MG/ML IJ SOLN
5.0000 mg | Freq: Four times a day (QID) | INTRAMUSCULAR | Status: DC | PRN
  Filled 2011-11-29: qty 1

## 2011-11-29 MED ORDER — METOCLOPRAMIDE HCL 10 MG/10ML PO SOLN
5.0000 mg | Freq: Three times a day (TID) | ORAL | Status: DC
  Administered 2011-11-29 – 2011-12-02 (×11): 5 mg via ORAL
  Filled 2011-11-29 (×15): qty 5

## 2011-11-29 MED ORDER — POTASSIUM CHLORIDE 10 MEQ/50ML IV SOLN
INTRAVENOUS | Status: AC
  Filled 2011-11-29: qty 50

## 2011-11-29 NOTE — Progress Notes (Signed)
VASCULAR PROGRESS NOTE  SUBJECTIVE: Complains of some nausea. States that she has been moving her bowels and passing flatus.  PHYSICAL EXAM: Filed Vitals:   11/29/11 0400 11/29/11 0500 11/29/11 0600 11/29/11 0700  BP: 163/68 146/56 155/63 162/70  Pulse: 73 70 63 71  Temp: 99.2 F (37.3 C)     TempSrc:      Resp: 24 21 24 24   Height:      Weight:   156 lb 4.9 oz (70.9 kg)   SpO2: 99% 97% 100% 97%   Abdomen slightly distended. Normal pitched bowel sounds. Lungs clear to auscultation.  LABS: Lab Results  Component Value Date   WBC 10.2 11/29/2011   HGB 10.4* 11/29/2011   HCT 32.1* 11/29/2011   MCV 91.2 11/29/2011   PLT 327 11/29/2011   Lab Results  Component Value Date   CREATININE 0.47* 11/29/2011   Lab Results  Component Value Date   INR 1.33 11/21/2011   CBG (last 3)  No results found for this basename: GLUCAP:3 in the last 72 hours   ASSESSMENT/PLAN: 1. 8 Days Post-Op s/p: abdominal aortic aneurysm repair. 2. Transferred to the step down. 3. Check abdominal x-rays. We'll need to go slow with diet. 4. Continue physical therapy.  Cari Caraway Beeper: [phone] 11/29/2011

## 2011-11-29 NOTE — Progress Notes (Signed)
PULMONARY  / CRITICAL CARE MEDICINE  Name: Lauren Morrow MRN: [id number] DOB: Jul 16, 1924    LOS: 8  REFERRING MD :  Josephina Gip    CHIEF COMPLAINT: Post-op respiratory failure  BRIEF PATIENT DESCRIPTION:  78 yo female never smoker admitted on 11/21/2011 for elective repair of AAA.  Developed hypotension/tachycardia post-op in ICU 2nd to retroperitoneal hematoma.  Returned to OR emergently 11/15, and remained on vent post-op.  PCCM consulted 11/15.  Significant PMHx Aortic stenosis s/p bioprosthetic AVR, HTN, PAF, PVD  LINES / TUBES: ETT 11/15>>11/17 Lt Radial aline 11/15>>11/18 Rt IJ CVL 11/15>>  CULTURES: Results for orders placed during the hospital encounter of 11/21/11  URINE CULTURE     Status: Normal   Collection Time   11/14/11  1:50 PM      Component Value Range Status Comment   Specimen Description URINE, CLEAN CATCH   Final    Special Requests NONE   Final    Culture  Setup Time 11/14/2011 15:23   Final    Colony Count >=100,000 COLONIES/ML   Final    Culture KLEBSIELLA PNEUMONIAE   Final    Report Status 11/16/2011 FINAL   Final    Organism ID, Bacteria KLEBSIELLA PNEUMONIAE   Final   CLOSTRIDIUM DIFFICILE BY PCR     Status: Normal   Collection Time   11/27/11  7:47 AM      Component Value Range Status Comment   C difficile by pcr NEGATIVE  NEGATIVE Final   URINE CULTURE     Status: Normal (Preliminary result)   Collection Time   11/27/11  9:40 AM      Component Value Range Status Comment   Specimen Description URINE, CATHETERIZED   Final    Special Requests NONE   Final    Culture  Setup Time 11/27/2011 11:10   Final    Colony Count >=100,000 COLONIES/ML   Final    Culture GRAM NEGATIVE RODS   Final    Report Status PENDING   Incomplete   CULTURE, EXPECTORATED SPUTUM-ASSESSMENT     Status: Normal   Collection Time   11/28/11  6:30 PM      Component Value Range Status Comment   Specimen Description SPUTUM   Final    Special Requests NONE   Final    Sputum evaluation     Final    Value: THIS SPECIMEN IS ACCEPTABLE. RESPIRATORY CULTURE REPORT TO FOLLOW.   Report Status 11/28/2011 FINAL   Final   CULTURE, RESPIRATORY     Status: Normal (Preliminary result)   Collection Time   11/28/11  6:30 PM      Component Value Range Status Comment   Specimen Description SPUTUM   Final    Special Requests NONE   Final    Gram Stain     Final    Value: ABUNDANT WBC PRESENT, PREDOMINANTLY PMN     NO SQUAMOUS EPITHELIAL CELLS SEEN     NO ORGANISMS SEEN   Culture PENDING   Incomplete    Report Status PENDING   Incomplete    No results found for this basename: PROCALCITON:5 in the last 168 hours   ANTIBIOTICS: Vancomycin 11/15>>11/15 Ancef 11/15>>11/15 Cefuroxime 11/15>>11/16  Anti-infectives     Start     Dose/Rate Route Frequency Ordered Stop   11/27/11 1600   cefUROXime (ZINACEF) 750 mg in dextrose 5 % 50 mL IVPB        750 mg 100 mL/hr over 30 Minutes Intravenous  3 times per day 11/27/11 1516     11/22/11 0000   cefUROXime (ZINACEF) 1.5 g in dextrose 5 % 50 mL IVPB        1.5 g 100 mL/hr over 30 Minutes Intravenous Every 12 hours 11/21/11 2112 11/22/11 1223   11/20/11 1419   vancomycin (VANCOCIN) IVPB 1000 mg/200 mL premix        1,000 mg 200 mL/hr over 60 Minutes Intravenous 120 min pre-op 11/20/11 1419 11/21/11 0752            SIGNIFICANT EVENTS:  11/15 retroperitoneal bleeding, post-op VDRF 11/17 BiPAP prn after extubation 11/21 Diarrhea - c diff engative  LEVEL OF CARE:  ICU PRIMARY SERVICE:  Hart [place], [facility] CONSULTANTS:  PCCM CODE STATUS: Full DIET:  NPO DVT Px:  SQ heparin GI Px:  Protonix  INTERVAL HISTORY:  'cannot eat' denies abd- al pain  VITAL SIGNS: Temp:  [97.4 F (36.3 C)-99.2 F (37.3 C)] 97.4 F (36.3 C) (11/23 0810) Pulse Rate:  [63-84] 79  (11/23 1000) Resp:  [20-36] 21  (11/23 1000) BP: (126-199)/(53-87) 171/68 mmHg (11/23 1000) SpO2:  [95 %-100 %] 97 % (11/23 1000) Weight:  [70.9 kg (156 lb  4.9 oz)] 70.9 kg (156 lb 4.9 oz) (11/23 0600)  INTAKE / OUTPUT: Intake/Output      11/22 0701 - 11/23 0700 11/23 0701 - 11/24 0700   P.O. 300 120   I.V. (mL/kg) 1840 (26) 75 (1.1)   IV Piggyback 302 210   Total Intake(mL/kg) 2442 (34.4) 405 (5.7)   Urine (mL/kg/hr) 1650 (1) 100   Stool 200    Total Output 1850 100   Net +592 +305          PHYSICAL EXAMINATION: General:  Chronically unwell loking Neuro:  Awake, follows commands but confused pleasantly.  Moves all 4s HEENT:  No sinus tenderness Cardiovascular:  s1s2 regular, tachycardic, 2/6 SM Lungs:  No wheeze/rales but mild purse lip breathing +. Expiration prolonged. Mildly tachypneic + Abdomen:  Soft, mild distention Musculoskeletal:  No edema Skin:  No rashes   LABS:  Lab 11/29/11 0400 11/28/11 1103 11/28/11 0520 11/27/11 0420 11/26/11 0405 11/25/11 1652 11/23/11 1057 11/23/11 0600 11/23/11 0300 11/22/11 1630  HGB 10.4* -- 10.8* 11.9* -- -- -- -- -- --  WBC 10.2 -- 10.3 13.8* -- -- -- -- -- --  PLT 327 -- 273 253 -- -- -- -- -- --  NA 143 -- 138 139 -- -- -- -- -- --  K 3.2* -- 2.8* -- -- -- -- -- -- --  CL 107 -- 102 100 -- -- -- -- -- --  CO2 28 -- 30 30 -- -- -- -- -- --  GLUCOSE 119* -- 120* 121* -- -- -- -- -- --  BUN 14 -- 17 19 -- -- -- -- -- --  CREATININE 0.47* -- 0.48* 0.52 -- -- -- -- -- --  CALCIUM 8.2* -- 8.1* 8.6 -- -- -- -- -- --  MG 1.6 -- -- -- 1.7 -- -- -- 1.7 --  PHOS 2.7 -- -- -- -- -- -- -- -- --  AST -- -- -- -- -- 23 -- -- -- --  ALT -- -- -- -- -- 19 -- -- -- --  ALKPHOS -- -- -- -- -- 65 -- -- -- --  BILITOT -- -- -- -- -- 1.2 -- -- -- --  PROT -- -- -- -- -- 5.8* -- -- -- --  ALBUMIN -- -- -- -- --  2.5* -- -- -- --  APTT -- -- -- -- -- -- -- -- -- --  INR -- -- -- -- -- -- -- -- -- --  LATICACIDVEN -- -- -- -- -- -- -- -- -- --  TROPONINI -- -- -- -- -- -- -- <0.30 <0.30 <0.30  PROCALCITON -- -- -- -- -- -- -- -- -- --  PROBNP 2008.0* -- -- -- -- 5825.0* -- -- -- --  O2SATVEN -- --  -- -- -- -- -- -- -- --  PHART -- 7.465* -- -- -- -- 7.384 -- -- --  PCO2ART -- 38.9 -- -- -- -- 39.7 -- -- --  PO2ART -- 81.3 -- -- -- -- 73.0* -- -- --    Lab 11/22/11 1557 11/22/11 1222  GLUCAP 105* 104*    IMAGING: AXR 11/21 >>gas in large/small bowel w/o significant distention  Dg Chest Port 1 View  11/28/2011  *RADIOLOGY REPORT*  Clinical Data: Respiratory distress  PORTABLE CHEST - 1 VIEW  Comparison: 11/25/2011  Findings: Interval increase in bilateral pleural effusion and bibasilar atelectasis.  Pulmonary  vascular congestion is also slightly more prominent.  Findings suggest fluid overload.  Right jugular catheter tip in the SVC.  Prior aortic valve replacement  IMPRESSION: Increase in vascular congestion and bilateral effusions suggesting fluid overload.   Original Report Authenticated By: Janeece Riggers, M.D.      DIAGNOSES: Principal Problem:  *S/P AAA repair Active Problems:  Respiratory failure, post-operative  Acute blood loss anemia  Thrombocytopenia due to blood loss  Hypotension  Hypervolemia  Hypokalemia  Diarrhea  Respiratory distress  Encephalopathy acute   ASSESSMENT / PLAN:  PULMONARY  ASSESSMENT: Post-op respiratory failure.  PLAN:  Mild increased wob - related to abd distension IS   CARDIOVASCULAR  ASSESSMENT:  S/p AAA repair. Hypotension 2nd to hypovolemia/hemorrhage.Resolved. Sinus tachycardia.Improved with increased dose of lopressor. Hx of HTN. Echo -nml LV fn, gr 1 diast dysfn Volume overload. 10.8 liters positive since admission.  Good response to lasix 11/19 and 11/20 and 11/27/11    PLAN:  Post-op care per [facility] Lasix 20 mg x 1 on 11/22 Continue IV lopressor 5 mg q6h until able to take oral meds Would minimise IVfs     RENAL  ASSESSMENT:   Hypokalemia. PLAN:   F/u and replace electrolytes as needed while getting lasix  GASTROINTESTINAL  ASSESSMENT:   Prot cal malnutritionNutrition. Diarrhea. New 11/21. C Diff  negative  PLAN:   Liquid diet - Ileus resolving Flexiseal in place Monitor  HEMATOLOGIC  ASSESSMENT:   Acute blood loss anemia 2nd to retroperitoneal bleed. No further evidence for bleeding. Thrombocytopenia. Resolved 11/21. PLAN:  F/u CBC  SQ heparin for DVT prophylaxis Transfuse for Hb < 7 or bleeding  INFECTIOUS  ASSESSMENT:   Doubt diarrhea is related to infection. C diff engative PLAN:   Monitor Keep K > 4  ENDOCRINE  ASSESSMENT:   Hyperglycemia. Likely related to acute stress.  No hx of DM.   Resolved. PLAN:   Monitor blood sugars on BMET  NEUROLOGIC  ASSESSMENT:   Post-op pain control.  - on 11/28/11 - mild delirium - resolved PLAN:   Monitor   CLINICAL SUMMARY:  OK to transfer to SDU, Resp status related to abdominal distension   ALVA,RAKESH V.  11/29/2011 10:55 AM

## 2011-11-29 NOTE — Progress Notes (Signed)
Transferred to 3315 via wheelchair. On O2 at 2 liters/[place]. Alert and oriented. Foley catheter still in place as Dr. Cyril Mourning verbally ordered to keep it due to intermetent diuresis. Flexiseal came out upon transfer from wheelchair to bed. Handed over to [place], the receiving RN. No other untoward event happened during transport.

## 2011-11-29 NOTE — Progress Notes (Signed)
Pt with positive UTI > 100K colonies G- rods - awaiting cultures - on ABX

## 2011-11-30 LAB — BASIC METABOLIC PANEL
CO2: 29 mEq/L (ref 19–32)
Calcium: 8.2 mg/dL — ABNORMAL LOW (ref 8.4–10.5)
Creatinine, Ser: 0.46 mg/dL — ABNORMAL LOW (ref 0.50–1.10)
GFR calc Af Amer: >90 mL/min (ref >90)
GFR calc non Af Amer: 88 mL/min — ABNORMAL LOW (ref >90)
Sodium: 138 mEq/L (ref 135–145)

## 2011-11-30 LAB — URINE CULTURE

## 2011-11-30 LAB — CBC
Platelets: 357 10*3/uL (ref 150–400)
RBC: 3.43 MIL/uL — ABNORMAL LOW (ref 3.87–5.11)
RDW: 15.9 % — ABNORMAL HIGH (ref 11.5–15.5)
WBC: 9.2 10*3/uL (ref 4.0–10.5)

## 2011-11-30 MED ORDER — PANTOPRAZOLE SODIUM 40 MG PO TBEC
40.0000 mg | DELAYED_RELEASE_TABLET | Freq: Every day | ORAL | Status: DC
  Administered 2011-11-30 – 2011-12-02 (×3): 40 mg via ORAL
  Filled 2011-11-30 (×3): qty 1

## 2011-11-30 MED ORDER — POTASSIUM CHLORIDE CRYS ER 20 MEQ PO TBCR
EXTENDED_RELEASE_TABLET | ORAL | Status: AC
  Administered 2011-11-30: 20 meq via ORAL
  Filled 2011-11-30: qty 1

## 2011-11-30 MED ORDER — SODIUM CHLORIDE 0.9 % IJ SOLN
INTRAMUSCULAR | Status: AC
  Filled 2011-11-30: qty 30

## 2011-11-30 MED ORDER — ADULT MULTIVITAMIN W/MINERALS CH
1.0000 | ORAL_TABLET | Freq: Every day | ORAL | Status: DC
  Administered 2011-11-30 – 2011-12-02 (×3): 1 via ORAL
  Filled 2011-11-30 (×3): qty 1

## 2011-11-30 MED ORDER — FUROSEMIDE 10 MG/ML IJ SOLN
40.0000 mg | Freq: Once | INTRAMUSCULAR | Status: AC
  Administered 2011-11-30: 40 mg via INTRAVENOUS
  Filled 2011-11-30: qty 4

## 2011-11-30 MED ORDER — METOPROLOL TARTRATE 25 MG PO TABS
25.0000 mg | ORAL_TABLET | Freq: Two times a day (BID) | ORAL | Status: DC
  Administered 2011-11-30 – 2011-12-02 (×5): 25 mg via ORAL
  Filled 2011-11-30 (×7): qty 1

## 2011-11-30 MED ORDER — CEFUROXIME AXETIL 250 MG PO TABS
250.0000 mg | ORAL_TABLET | Freq: Two times a day (BID) | ORAL | Status: DC
  Administered 2011-11-30 – 2011-12-02 (×5): 250 mg via ORAL
  Filled 2011-11-30 (×7): qty 1

## 2011-11-30 MED ORDER — BISACODYL 10 MG RE SUPP: 10.0000 mg | Freq: Every day | RECTAL | Status: DC | PRN

## 2011-11-30 MED ORDER — MECLIZINE HCL 25 MG PO TABS: 25.0000 mg | ORAL_TABLET | ORAL | Status: DC | PRN

## 2011-11-30 MED ORDER — SERTRALINE HCL 50 MG PO TABS
50.0000 mg | ORAL_TABLET | Freq: Every day | ORAL | Status: DC
  Administered 2011-11-30 – 2011-12-02 (×3): 50 mg via ORAL
  Filled 2011-11-30 (×3): qty 1

## 2011-11-30 MED ORDER — POTASSIUM CHLORIDE CRYS ER 20 MEQ PO TBCR
20.0000 meq | EXTENDED_RELEASE_TABLET | Freq: Two times a day (BID) | ORAL | Status: AC
  Administered 2011-11-30 – 2011-12-01 (×3): 20 meq via ORAL
  Filled 2011-11-30 (×3): qty 1

## 2011-11-30 MED ORDER — ASPIRIN EC 325 MG PO TBEC
325.0000 mg | DELAYED_RELEASE_TABLET | Freq: Every day | ORAL | Status: DC
  Administered 2011-11-30 – 2011-12-02 (×3): 325 mg via ORAL
  Filled 2011-11-30 (×3): qty 1

## 2011-11-30 NOTE — Progress Notes (Addendum)
[facility] [place]  Post-op  Intra-abdominal Surgery note  Date of Surgery: 11/21/2011  Surgeon(s): Pryor Ochoa, MD Fransisco Hertz, MD  9 Days Post-Op Procedure(s): Open AAA repair EXPLORATORY LAPAROTOMY  History of Present Illness  Lauren Morrow is a 78 y.o. female who is  up s/p Procedure(s): EXPLORATORY LAPAROTOMY Pt is doing well. denies incisional pain; denies nausea/vomiting; denies diarrhea. has had flatus;has had BM  IMAGING: Dg Chest Port 1 View  11/28/2011  *RADIOLOGY REPORT*  Clinical Data: Respiratory distress  PORTABLE CHEST - 1 VIEW  Comparison: 11/25/2011  Findings: Interval increase in bilateral pleural effusion and bibasilar atelectasis.  Pulmonary  vascular congestion is also slightly more prominent.  Findings suggest fluid overload.  Right jugular catheter tip in the SVC.  Prior aortic valve replacement  IMPRESSION: Increase in vascular congestion and bilateral effusions suggesting fluid overload.   Original Report Authenticated By: Janeece Riggers, M.D.    Dg Abd 2 Views  11/29/2011  *RADIOLOGY REPORT*  Clinical Data: Abdominal pain and bloating after aneurysm repair 4 days ago.  ABDOMEN - 2 VIEW  Comparison: 11/27/2011  Findings: Midline laparotomy staples remain.  Gas within normal caliber colon and upper normal caliber small bowel.  Overall improved since the prior. No pneumatosis or free intraperitoneal air.  Distal gas and stool.  Vascular calcifications.  Convex right lumbar spine curvature with advanced spondylosis.  IMPRESSION: Gas within large and small bowel loops.  Most consistent with improving postoperative adynamic ileus.  No free intraperitoneal air or other acute complication.   Original Report Authenticated By: Jeronimo Greaves, M.D.     Significant Diagnostic Studies: CBC Lab Results  Component Value Date   WBC 9.2 11/30/2011   HGB 10.3* 11/30/2011   HCT 31.8* 11/30/2011   MCV 92.7 11/30/2011   PLT 357 11/30/2011    BMET    Component Value Date/Time   NA 138 11/30/2011 0500   K 3.5 11/30/2011 0500   CL 105 11/30/2011 0500   CO2 29 11/30/2011 0500   GLUCOSE 102* 11/30/2011 0500   BUN 11 11/30/2011 0500   CREATININE 0.46* 11/30/2011 0500   CREATININE 0.60 10/23/2011 1030   CALCIUM 8.2* 11/30/2011 0500   GFRNONAA 88* 11/30/2011 0500   GFRAA >90 11/30/2011 0500    COAG Lab Results  Component Value Date   INR 1.33 11/21/2011   INR 1.37 11/21/2011   INR 1.01 11/14/2011   No results found for this basename: PTT    I/O last 3 completed shifts: In: 2917.9 [P.O.:390; I.V.:2017.9; IV Piggyback:510] Out: 1390 [Urine:1190; Stool:200]    Physical Examination BP Readings from Last 3 Encounters:  11/30/11 176/65  11/30/11 176/65  11/30/11 176/65   Temp Readings from Last 3 Encounters:  11/30/11 98 F (36.7 C) Oral  11/30/11 98 F (36.7 C) Oral  11/30/11 98 F (36.7 C) Oral   SpO2 Readings from Last 3 Encounters:  11/30/11 97%  11/30/11 97%  11/30/11 97%   Pulse Readings from Last 3 Encounters:  11/30/11 70  11/30/11 70  11/30/11 70    General: A&O x 3, WDWN female in NAD Pulmonary: normal non-labored breathing , without Rales, rhonchi,  wheezing Cardiac: Heart rate : regular ,  Abdomen:abdomen soft and normal active bowel sounds Abdominal wound:clean, dry, intact  Neurologic: A&O X 3; Appropriate Affect ; SENSATION: normal; MOTOR FUNCTION:  moving all extremities equally. Speech is fluent/normal  Vascular Exam:BLE warm and well perfused Extremities without ischemic changes, no Gangrene, no cellulitis;  no open wounds;   LOWER EXTREMITY PULSES           RIGHT                                      LEFT      POSTERIOR TIBIAL  palpable  palpable   Assessment/Plan: Lauren Morrow is a 78 y.o. female who is 9 Days Post-Op Procedure(s): open AAA repair EXPLORATORY LAPAROTOMY ilieus post-op resolving  DC Foley Change meds to PO Ambulate 3 times per day  [place] [phone] 11/30/2011 7:40 AM  Lungs: crackles at both bases. Will give Lasix. Last CXR 11/28/11 she looked wet also. K = 3.5, so will also supplement K. (20 meq BID for 3 doses) Mild ileus. Abdominal x-ray shows no evidence of obstruction. Dulcolax today.  Ambulate.  Physical Therapy is following.   Cari Caraway Beeper [phone] 11/30/2011

## 2011-11-30 NOTE — Progress Notes (Signed)
ANTIBIOTIC CONSULT NOTE - FOLLOW UP  Pharmacy Consult for antibiotic choice in patient with UTI and multiple drug allergies Indication: UTI  Allergies  Allergen Reactions  . Avelox (Moxifloxacin Hcl In Nacl) Rash    Avelox began at approximately 0800. At 0807 pt noted to have red-streaking proximal to the PIV site, up the left arm, to the neck/chest area. Avelox immediately discontinued. Pt received about 1/4 the dose or 100mg . Vital signs remained stable. The anesthesiologist and surgeon were notified and shown the site of red, rashy streaking.  Marland Kitchen Penicillins Swelling  . Sulfa Antibiotics Other (See Comments)    unknown    Patient Measurements: Height: 5\' 8"  (172.7 cm) Weight: 156 lb 4.9 oz (70.9 kg) IBW/kg (Calculated) : 63.9    Vital Signs: Temp: 98.1 F (36.7 C) (11/24 1116) Temp src: Oral (11/24 1116) BP: 171/68 mmHg (11/24 0948) Pulse Rate: 93  (11/24 0948) Intake/Output from previous day: 11/23 0701 - 11/24 0700 In: 2002.9 [P.O.:390; I.V.:1202.9; IV Piggyback:410] Out: 930 [Urine:830; Stool:100]  Labs:  [facility] 11/30/11 0500 11/29/11 0400 11/28/11 0520  WBC 9.2 10.2 10.3  HGB 10.3* 10.4* 10.8*  PLT 357 327 273  LABCREA -- -- --  CREATININE 0.46* 0.47* 0.48*   Estimated Creatinine Clearance: 51.9 ml/min (by C-G formula based on Cr of 0.46).  Microbiology: Recent Results (from the past 720 hour(s))  SURGICAL PCR SCREEN     Status: Normal   Collection Time   11/14/11  1:50 PM      Component Value Range Status Comment   MRSA, PCR NEGATIVE  NEGATIVE Final    Staphylococcus aureus NEGATIVE  NEGATIVE Final   URINE CULTURE     Status: Normal   Collection Time   11/14/11  1:50 PM      Component Value Range Status Comment   Specimen Description URINE, CLEAN CATCH   Final    Special Requests NONE   Final    Culture  Setup Time 11/14/2011 15:23   Final    Colony Count >=100,000 COLONIES/ML   Final    Culture KLEBSIELLA PNEUMONIAE   Final    Report Status  11/16/2011 FINAL   Final    Organism ID, Bacteria KLEBSIELLA PNEUMONIAE   Final   CLOSTRIDIUM DIFFICILE BY PCR     Status: Normal   Collection Time   11/27/11  7:47 AM      Component Value Range Status Comment   C difficile by pcr NEGATIVE  NEGATIVE Final   URINE CULTURE     Status: Normal (Preliminary result)   Collection Time   11/27/11  9:40 AM      Component Value Range Status Comment   Specimen Description URINE, CATHETERIZED   Final    Special Requests NONE   Final    Culture  Setup Time 11/27/2011 11:10   Final    Colony Count >=100,000 COLONIES/ML   Final    Culture GRAM NEGATIVE RODS   Final    Report Status PENDING   Incomplete   CULTURE, EXPECTORATED SPUTUM-ASSESSMENT     Status: Normal   Collection Time   11/28/11  6:30 PM      Component Value Range Status Comment   Specimen Description SPUTUM   Final    Special Requests NONE   Final    Sputum evaluation     Final    Value: THIS SPECIMEN IS ACCEPTABLE. RESPIRATORY CULTURE REPORT TO FOLLOW.   Report Status 11/28/2011 FINAL   Final   CULTURE, RESPIRATORY  Status: Normal (Preliminary result)   Collection Time   11/28/11  6:30 PM      Component Value Range Status Comment   Specimen Description SPUTUM   Final    Special Requests NONE   Final    Gram Stain     Final    Value: ABUNDANT WBC PRESENT, PREDOMINANTLY PMN     NO SQUAMOUS EPITHELIAL CELLS SEEN     NO ORGANISMS SEEN   Culture NORMAL OROPHARYNGEAL FLORA   Final    Report Status PENDING   Incomplete     Anti-infectives     Start     Dose/Rate Route Frequency Ordered Stop   11/30/11 0800   cefUROXime (CEFTIN) tablet 250 mg        250 mg Oral 2 times daily with meals 11/30/11 0744     11/27/11 1600   cefUROXime (ZINACEF) 750 mg in dextrose 5 % 50 mL IVPB  Status:  Discontinued        750 mg 100 mL/hr over 30 Minutes Intravenous 3 times per day 11/27/11 1516 11/30/11 0746   11/22/11 0000   cefUROXime (ZINACEF) 1.5 g in dextrose 5 % 50 mL IVPB        1.5  g 100 mL/hr over 30 Minutes Intravenous Every 12 hours 11/21/11 2112 11/22/11 1223   11/20/11 1419   vancomycin (VANCOCIN) IVPB 1000 mg/200 mL premix        1,000 mg 200 mL/hr over 60 Minutes Intravenous 120 min pre-op 11/20/11 1419 11/21/11 0752          Assessment: 87 YOF s/p AAA repair on 11/15 and now has UTI. Gram stain grew gram negative rods, sensitivities not resulted yet. Patient has been converted appropriately from IV cefuroxime to po cefuroxime.  Now on cefuroxime 250mg  PO BID. SCr is stable. WBC 9.2, afebrile.   Goal of Therapy:  eradication of infection  Plan:  1. Continue cefuroxime 250mg  PO BID 2. Follow up sensitivities 3. Follow renal function, clinical progress and LOT  Preslie Depasquale D. Catalaya Garr, PharmD Clinical Pharmacist Pager: [phone] Phone: [phone] 11/30/2011 11:43 AM

## 2011-12-01 LAB — GLUCOSE, CAPILLARY: Glucose-Capillary: 124 mg/dL — ABNORMAL HIGH (ref 70–99)

## 2011-12-01 NOTE — Progress Notes (Signed)
Rehab Admissions Coordinator Note:  Patient was screened by Clois Dupes for appropriateness for an Inpatient Acute Rehab Consult. Initial rehab consult completed 11/19 by Dr. Riley Kill. Melanee Spry, Admission Coordinator saw pt 11/20 and pt stated she preferred rehab closer to home [facility]). I will ask Thurston Hole to follow up with pt to again discuss her preference for rehab venue. I have discussed with RNCM. Thurston Hole will follow up in the morning and can be reached at [phone].  Clois Dupes, RN 12/01/2011, 3:54 PM  I can be reached at [phone].

## 2011-12-01 NOTE — Progress Notes (Signed)
Seen and agree with SPT note. Pt was incontinent of urine immediately upon standing and required assist for pericare prior to mobility. Delaney Meigs, PT [phone]

## 2011-12-01 NOTE — Progress Notes (Addendum)
Pt transferred to 3300. This CSW to begin following. From previous CSW note pt has been faxed out.   This CSW ha has given pt bed offers. Pt would like placement at [facility]. CSW left a message for admissions representative to confirm a bed is still available for pt. CSW to remain follow and facilitate with dc planning.  Theresia Bough, MSW, [facility] [phone]

## 2011-12-01 NOTE — Progress Notes (Signed)
Patient being transferred to 2002 via wheelchair. Have called phone report down to [place], Charity fundraiser. Patient has been made aware of the transfer.

## 2011-12-01 NOTE — Progress Notes (Signed)
Physical Therapy Treatment Patient Details Name: Lauren Morrow MRN: [id number] DOB: Feb 21, 1924 Today's Date: 12/01/2011 Time: 4098-1191 PT Time Calculation (min): 26 min  PT Assessment / Plan / Recommendation Comments on Treatment Session  Pt. admitted s/p AAA repair; Pt. progressing with mobility and acticity tolerance. O2 dropped to 87% at lowest on room air during ambulation and pt. SOB; Pt. able to state that she felt SOB and cued on deep breathing. Continue to progress pt with mobility and proper use of DME.     Follow Up Recommendations  SNF           Equipment Recommendations  None recommended by PT       Frequency Min 3X/week   Plan Discharge plan remains appropriate;Frequency remains appropriate    Precautions / Restrictions Precautions Precautions: Fall Precaution Comments: Incontinent with urine Restrictions Weight Bearing Restrictions: No   Pertinent Vitals/Pain O2 between 92-95% on room air consistently during ambulation; O2 would periodically drop to 87% at lowest No pain reported    Mobility  Bed Mobility Bed Mobility: Not assessed Transfers Sit to Stand: 4: Min assist;From chair/3-in-1 Stand to Sit: 4: Min assist;To chair/3-in-1 Details for Transfer Assistance: Performed x2 from chair tp BSC then [facility] to chair after ambulation. Pt. required cueing for sequencing and hand placement. Ambulation/Gait Ambulation/Gait Assistance: 4: Min assist Ambulation Distance (Feet): 150 Feet Assistive device: Rolling walker Ambulation/Gait Assistance Details: Pt. required cueing to stay inside RW. Standing rest breaks x5 to cue pt. on deep breathing; O2 dropped to 87% on room air at lowest but once pt. cued on breathing O2 would quickly rise back above 90%. Gait Pattern: Step-through pattern;Decreased stride length Gait velocity: decreased Stairs: No    Exercises General Exercises - Lower Extremity Long Arc Quad: AROM;15 reps;Both;Seated Hip Flexion/Marching:  AROM;20 reps;Both;Seated     PT Goals Acute Rehab PT Goals PT Goal: Sit to Stand - Progress: Progressing toward goal PT Goal: Stand to Sit - Progress: Progressing toward goal PT Goal: Ambulate - Progress: Progressing toward goal  Visit Information  Last PT Received On: 12/01/11 Assistance Needed: +1    Subjective Data  Subjective: "I've had a problem with holding my urine for a few years now."   Cognition  Overall Cognitive Status: Appears within functional limits for tasks assessed/performed Arousal/Alertness: Awake/alert Orientation Level: Appears intact for tasks assessed Behavior During Session: [facility] for tasks performed       End of Session PT - End of Session Equipment Utilized During Treatment: Gait belt Activity Tolerance: Patient tolerated treatment well Patient left: in chair;with call bell/phone within reach Nurse Communication: Mobility status     Army Chaco SPT 12/01/2011, 1:59 PM

## 2011-12-01 NOTE — Progress Notes (Signed)
Clinical Social Worker met with pt and pt's two sisters at bedside. CSW introduced self and explained role and provided opportunity for pt and family to process questions/concerns related to SNF.  Pt interested in SNF-[facility] or [facility].  CSW updated RNCM and submitted updated clinical information to [facility].  CSW to continue to follow and assist as needed.   Angelia Mould, MSW, [place] [phone]

## 2011-12-01 NOTE — Progress Notes (Addendum)
[facility] [place]  Post-op  Intra-abdominal Surgery note  Date of Surgery: 11/21/2011  Surgeon(s): Pryor Ochoa, MD Fransisco Hertz, MD  10 Days Post-Op Procedure(s):Open AAA EXPLORATORY LAPAROTOMY  History of Present Illness  MICHAELANNE BOATWRIGHT is a 78 y.o. female who is  up s/p Procedure(s): EXPLORATORY LAPAROTOMY Pt is doing well. denies incisional pain; denies nausea/vomiting; denies diarrhea. has had flatus;has had BM  IMAGING: Dg Abd 2 Views  11/29/2011  *RADIOLOGY REPORT*  Clinical Data: Abdominal pain and bloating after aneurysm repair 4 days ago.  ABDOMEN - 2 VIEW  Comparison: 11/27/2011  Findings: Midline laparotomy staples remain.  Gas within normal caliber colon and upper normal caliber small bowel.  Overall improved since the prior. No pneumatosis or free intraperitoneal air.  Distal gas and stool.  Vascular calcifications.  Convex right lumbar spine curvature with advanced spondylosis.  IMPRESSION: Gas within large and small bowel loops.  Most consistent with improving postoperative adynamic ileus.  No free intraperitoneal air or other acute complication.   Original Report Authenticated By: Jeronimo Greaves, M.D.     Significant Diagnostic Studies: CBC Lab Results  Component Value Date   WBC 9.2 11/30/2011   HGB 10.3* 11/30/2011   HCT 31.8* 11/30/2011   MCV 92.7 11/30/2011   PLT 357 11/30/2011    BMET    Component Value Date/Time   NA 138 11/30/2011 0500   K 3.5 11/30/2011 0500   CL 105 11/30/2011 0500   CO2 29 11/30/2011 0500   GLUCOSE 102* 11/30/2011 0500   BUN 11 11/30/2011 0500   CREATININE 0.46* 11/30/2011 0500   CREATININE 0.60 10/23/2011 1030   CALCIUM 8.2* 11/30/2011 0500   GFRNONAA 88* 11/30/2011 0500   GFRAA >90 11/30/2011 0500    COAG Lab Results  Component Value Date   INR 1.33 11/21/2011   INR 1.37 11/21/2011   INR 1.01 11/14/2011   No results found for this basename: PTT    I/O last 3 completed shifts: In: 550  [I.V.:500; IV Piggyback:50] Out: 1428 [Urine:1427; Stool:1]    Physical Examination BP Readings from Last 3 Encounters:  12/01/11 155/98  12/01/11 155/98  12/01/11 155/98   Temp Readings from Last 3 Encounters:  12/01/11 98.1 F (36.7 C) Oral  12/01/11 98.1 F (36.7 C) Oral  12/01/11 98.1 F (36.7 C) Oral   SpO2 Readings from Last 3 Encounters:  12/01/11 97%  12/01/11 97%  12/01/11 97%   Pulse Readings from Last 3 Encounters:  12/01/11 72  12/01/11 72  12/01/11 72    General: A&O x 3, WDWN female in NAD Pulmonary: normal non-labored breathing , without Rales, rhonchi,  wheezing Cardiac: Heart rate : regular ,  Abdomen:non-tender Abdominal wound:clean, dry, intact  Neurologic: A&O X 3; Appropriate Affect ; SENSATION: normal; MOTOR FUNCTION:  moving all extremities equally. Speech is fluent/normal  Vascular Exam:BLE warm and well perfused Extremities without ischemic changes, no Gangrene, no cellulitis; no open wounds;  A&O x 3, WDWN female in NAD Abdomen:abdomen soft and normal active bowel sounds Abdominal wound:clean, dry, intact      Assessment/Plan: FRICA LAHOOD is a 78 y.o. female who is 10 Days Post-Op Procedure(s):Open AAA EXPLORATORY LAPAROTOMY Taking PO's well Ambulating with Assistants Urine out put total 11/30/2011 1,177 after 2 doses of lasix I will order her a 3 in 1 for home she has a rolling walker. Transfer to 2000  needs 3 in 1 for home     Clinton Gallant [facility] [phone]  12/01/2011 7:32 AM      Agree with above Abdomen relatively soft and nontender midline incision healing nicely 3+ femoral pulses palpable bilaterally Tolerating diet fairly well-diarrhea much improved  Plan to discharge to skilled nursing facility by Wednesday if feasible and patient ready-transfer 2000 today

## 2011-12-02 MED ORDER — PHENAZOPYRIDINE HCL 100 MG PO TABS
100.0000 mg | ORAL_TABLET | Freq: Three times a day (TID) | ORAL | Status: DC
  Administered 2011-12-02 (×2): 100 mg via ORAL
  Filled 2011-12-02 (×4): qty 1

## 2011-12-02 MED ORDER — OXYCODONE-ACETAMINOPHEN 5-325 MG PO TABS: 1.0000 | ORAL_TABLET | ORAL | Status: DC | PRN

## 2011-12-02 NOTE — Progress Notes (Signed)
Assessment unchanged. Discussed D/C instructions with pt. Instructions placed in packet to go with pt. Tele D/C'd. Pt left via EMS for SNF.

## 2011-12-02 NOTE — Progress Notes (Signed)
[facility] [place]  Post-op  Intra-abdominal Surgery note  Date of Surgery: 11/21/2011  Surgeon(s): Pryor Ochoa, MD Fransisco Hertz, MD  11 Days Post-Op Procedure(s): EXPLORATORY LAPAROTOMY  History of Present Illness  Lauren Morrow is a 78 y.o. female who is  up s/p Procedure(s): EXPLORATORY LAPAROTOMY Pt is doing well. denies incisional pain; denies nausea/vomiting; denies diarrhea. has had flatus;has had BM.  Patient complains of pain with urination and is on Ceftin.  I am ordering Pyridium for the pain associated with her UTI.  IMAGING: No results found.  Significant Diagnostic Studies: CBC Lab Results  Component Value Date   WBC 9.2 11/30/2011   HGB 10.3* 11/30/2011   HCT 31.8* 11/30/2011   MCV 92.7 11/30/2011   PLT 357 11/30/2011    BMET    Component Value Date/Time   NA 138 11/30/2011 0500   K 3.5 11/30/2011 0500   CL 105 11/30/2011 0500   CO2 29 11/30/2011 0500   GLUCOSE 102* 11/30/2011 0500   BUN 11 11/30/2011 0500   CREATININE 0.46* 11/30/2011 0500   CREATININE 0.60 10/23/2011 1030   CALCIUM 8.2* 11/30/2011 0500   GFRNONAA 88* 11/30/2011 0500   GFRAA >90 11/30/2011 0500    COAG Lab Results  Component Value Date   INR 1.33 11/21/2011   INR 1.37 11/21/2011   INR 1.01 11/14/2011   No results found for this basename: PTT    I/O last 3 completed shifts: In: 340 [P.O.:340] Out: 500 [Urine:500]    Physical Examination BP Readings from Last 3 Encounters:  12/02/11 153/71  12/02/11 153/71  12/02/11 153/71   Temp Readings from Last 3 Encounters:  12/02/11 98.3 F (36.8 C) Oral  12/02/11 98.3 F (36.8 C) Oral  12/02/11 98.3 F (36.8 C) Oral   SpO2 Readings from Last 3 Encounters:  12/02/11 98%  12/02/11 98%  12/02/11 98%   Pulse Readings from Last 3 Encounters:  12/02/11 70  12/02/11 70  12/02/11 70    General: A&O x 3, WDWN female in NAD Pulmonary: normal non-labored breathing , without Rales, rhonchi,   wheezing Cardiac: Heart rate : regular ,  Abdomen:abdomen soft and non-tender Abdominal wound:clean, dry, intact  Neurologic: A&O X 3; Appropriate Affect ; SENSATION: normal; MOTOR FUNCTION:  moving all extremities equally. Speech is fluent/normal  Vascular Exam:BLE warm and well perfused Extremities without ischemic changes, no Gangrene, no cellulitis; no open wounds;  Incision is clean dry and intact without erythema.     Assessment/Plan: Lauren Morrow is a 77 y.o. female who is 11 Days Post-Op Procedure(s):Open AAA, EXPLORATORY LAPAROTOMY Pyridium ordered for UTI pain and she is on Ceftin antibiotic. Pending SNF placement for discharge       Clinton Gallant [facility] [phone] 12/02/2011 7:29 AM

## 2011-12-02 NOTE — Progress Notes (Signed)
Central Line removed per order. Pt tolerated well. End intact. Pressure applied for 5 mins. Pt instructed to lie flat for 30 mins. Will continue to monitor pt.

## 2011-12-02 NOTE — Progress Notes (Addendum)
Asked to f/up w/ pt to see if she had changed her mind & wished to come to CIR.  Her preference continues to be SNF close to her home.  Relayed this info to pt's SW, Aundra Millet, who will follow up w/ [facility] SNF about a bed for her. CIR will sign off.  [phone]   [Note pt does have a bed at [facility] today]

## 2011-12-02 NOTE — Progress Notes (Signed)
Occupational Therapy Treatment Patient Details Name: Lauren Morrow MRN: [id number] DOB: Aug 02, 1924 Today's Date: 12/02/2011 Time: 1478-2956 OT Time Calculation (min): 9 min  OT Assessment / Plan / Recommendation Comments on Treatment Session      Follow Up Recommendations  SNF    Barriers to Discharge       Equipment Recommendations       Recommendations for Other Services    Frequency Min 2X/week   Plan Discharge plan remains appropriate    Precautions / Restrictions Precautions Precautions: Fall Restrictions Weight Bearing Restrictions: No   Pertinent Vitals/Pain sats 88% after pt transferred to bed and quickly rose to 99%, on 02    ADL  Toilet Transfer: Performed;Min guard Toilet Transfer Method: Surveyor, minerals: Materials engineer and Hygiene: Performed;Minimal assistance Where Assessed - Engineer, mining and Hygiene: Standing Transfers/Ambulation Related to ADLs: spt to 3:1 with RW.   ADL Comments: Pt had 2/4 dyspnea when she stood to perform hygiene and clothes management. Min A given due to dyspnea/pt fatiqued.  Pt too tired to stand at sink to wash hands:  used gel once seated    OT Diagnosis:    OT Problem List:   OT Treatment Interventions:     OT Goals Acute Rehab OT Goals Time For Goal Achievement: 12/08/11 ADL Goals Pt Will Transfer to Toilet: Ambulation;Comfort height toilet ADL Goal: Toilet Transfer - Progress: Progressing toward goals Pt Will Perform Toileting - Clothing Manipulation: with supervision;Standing ADL Goal: Toileting - Clothing Manipulation - Progress: Progressing toward goals  Visit Information  Last OT Received On: 12/02/11 Assistance Needed: +1    Subjective Data      Prior Functioning       Cognition  Overall Cognitive Status: Appears within functional limits for tasks assessed/performed Arousal/Alertness: Awake/alert Orientation Level: Appears  intact for tasks assessed Behavior During Session: [facility] for tasks performed    Mobility  Shoulder Instructions Bed Mobility Sit to Supine: 5: Supervision Transfers Sit to Stand: 4: Min guard Details for Transfer Assistance: no cues needed today       Exercises      Balance     End of Session OT - End of Session Activity Tolerance: Patient limited by fatigue Patient left: in bed;with bed alarm set;with call bell/phone within reach  GO     [name] 12/02/2011, 3:19 PM Marica Otter, OTR/L [phone] 12/02/2011

## 2011-12-02 NOTE — Discharge Summary (Signed)
[facility] Discharge Summary   Patient ID:  Lauren Morrow MRN: [id number] DOB/AGE: 1924/10/20 78 y.o.  Admit date: 11/21/2011 Discharge date: 12/02/11 Date of Surgery: 11/21/2011 Surgeon: Surgeon(s): Lauren Ochoa, MD Lauren Hertz, MD  Admission Diagnosis: AAA bring back   Discharge Diagnoses:  AAA bring back   Secondary Diagnoses: Past Medical History  Diagnosis Date  . Varicose veins   . Seasonal allergies   . Osteomyelitis     history of osteomyelistis in the leg  . Aortic stenosis     Dr. Terrilee Morrow, saw last 09/11/11  . Dizziness   . Peripheral vascular disease   . Hypertension     sees Dr. Sherril Morrow, primary  . Atrial fibrillation     Post op, sees  Dr. Kirke Morrow  . Anxiety   . Shortness of breath     "gets short of breath at time since the heart surgery 2012"  . Urinary tract infection     hx of  . Kidney stones     hx of  . GERD (gastroesophageal reflux disease)   . Arthritis   . Anemia     hx of  . Low back pain   . Cataracts, bilateral     Procedure(s): EXPLORATORY LAPAROTOMY  Discharged Condition: good  HPI: 78 year old female returns today to discuss treatment options for her abdominal aortic aneurysm. She had a CT angiogram performed earlier today which I have reviewed by computer. It was hoped that she would be a candidate for aortic stent grafting but there is no significantinfra renal neck. Patient is not a candidate for aortic stent grafting. Patient did have aortic valve replacement performed 12 months ago and was slow to recover. She does ambulate on a daily basis however does not have severe COPD. She has good ventricular function and her valve is functioning well.      Hospital Course:  Lauren Morrow is a 78 y.o. female is S/P #1 resection and grafting of infrarenal abdominal aortic aneurysm with insertion aorta note by common iliac graft using 16 x 8 mm Hemashield Dacron graft  #2 repair laceration of left renal vein  with patch angioplasty using bovine patch  Returned to the OR secondary to  She had suddenly dropped systolic blood pressure from 120 systolic to 70. Again tachycardic at 100-110-sinus tach. Patient was alert and oriented x3. Did not complain of severe pain. Hemoglobin checked was 8 g compared to 11 g initially postop. Given 2 units of packed red blood cells and 2 units of fresh frozen plasma over 25 minute period with improvement in systolic blood pressure to 130/70 and heart rate decreased to 80s. The patient remains alert and oriented with no specific complaints other than nausea.  We returned to the OR at 8 pm for EXPLORATORY LAPAROTOMY with evacuation of intra-abdominal hematoma and ligation bleeding lumbar branch.     Extubated: POD # 2 78 y/o woman who underwent AAA repair this afternoon that was complicated by peritoneal bleed after surgery requiring return to the OR for repair. Her immediate post op Hg was 11, this dropped acutely to 8 and she had a hypotensive event. She was returned to the OR, the source of bleeding was identified and repaired. She remained intubated post-operatively for airway protection.   Post-op wounds healing well Pt. Ambulating, voiding and taking PO diet without difficulty. Pt pain controlled with PO pain meds. Labs as below Complications:  Consults:   Lauren Clines, MD critical  care Lauren Morrow, RD Registered Dietitian Lauren Oyster, MD Physician    Significant Diagnostic Studies: CBC Lab Results  Component Value Date   WBC 9.2 11/30/2011   HGB 10.3* 11/30/2011   HCT 31.8* 11/30/2011   MCV 92.7 11/30/2011   PLT 357 11/30/2011    BMET    Component Value Date/Time   NA 138 11/30/2011 0500   K 3.5 11/30/2011 0500   CL 105 11/30/2011 0500   CO2 29 11/30/2011 0500   GLUCOSE 102* 11/30/2011 0500   BUN 11 11/30/2011 0500   CREATININE 0.46* 11/30/2011 0500   CREATININE 0.60 10/23/2011 1030   CALCIUM 8.2* 11/30/2011 0500   GFRNONAA  88* 11/30/2011 0500   GFRAA >90 11/30/2011 0500   COAG Lab Results  Component Value Date   INR 1.33 11/21/2011   INR 1.37 11/21/2011   INR 1.01 11/14/2011     Disposition:  Discharge to :Skilled nursing facility  Discharge Orders    Future Appointments: Provider: Department: Dept Phone: Center:   01/13/2012 2:15 PM Lauren Ouch, MD [facility] (near [place]) [phone] [email or web address]      Lauren Morrow, Lauren Morrow  Home Medication Instructions [id number]   Printed on:12/02/11 1044  Medication Information                    Multiple Vitamin (MULTIVITAMIN) tablet Take 1 tablet by mouth daily.             diphenhydrAMINE (BENADRYL) 12.5 MG/5ML elixir Take 10 mLs (25 mg total) by mouth at bedtime as needed for sleep.           sertraline (ZOLOFT) 50 MG tablet Take 50 mg by mouth daily.            calcium-vitamin D (OSCAL WITH D) 500-200 MG-UNIT per tablet Take 1 tablet by mouth 2 (two) times daily.             fish oil-omega-3 fatty acids 1000 MG capsule Take 1 g by mouth daily.             omeprazole (PRILOSEC) 20 MG capsule Take 20 mg by mouth daily.           meclizine (ANTIVERT) 25 MG tablet Take 25 mg by mouth as needed.           Cholecalciferol (VITAMIN D) 1000 UNITS capsule Take 1,000 Units by mouth daily.           b complex vitamins tablet Take 1 tablet by mouth daily.           aspirin EC 325 MG tablet Take 1 tablet (325 mg total) by mouth daily.           metoprolol tartrate (LOPRESSOR) 25 MG tablet Take 25 mg by mouth 2 (two) times daily.            Verbal and written Discharge instructions given to the patient. Wound care per Discharge AVS Plan to D/C to SNF and follow up with Dr. Hart Rochester in 4 weeks from surgery date.  SignedMosetta Morrow 12/02/2011, 10:44 AM    Agree with above plan Patient is breathing comfortably on 2 L oxygen Abdomen much softer with regular bowel movements-no diarrhea Abdominal wound healing  well  Plan DC to SNF and skin staples to be removed in one week

## 2011-12-02 NOTE — Progress Notes (Signed)
Clinical Social Worker spoke with Elease Hashimoto at [facility] who stated they are able to accept pt today.  CSW to continue to follow and assist as needed.   Angelia Mould, MSW, [place] [phone]

## 2011-12-03 ENCOUNTER — Telehealth: Payer: Self-pay | Admitting: Vascular Surgery

## 2011-12-03 NOTE — Telephone Encounter (Signed)
Message copied by Margaretmary Eddy on Wed Dec 03, 2011  9:42 AM ------      Message from: Melene Plan      Created: Tue Dec 02, 2011  3:21 PM                   ----- Message -----         From: [place], [place]         Sent: 12/02/2011   1:49 PM           To: Melene Plan, RN            Hart Rochester wants to see her next week for staple removal instead of 2 weeks

## 2011-12-08 ENCOUNTER — Encounter: Payer: Self-pay | Admitting: Vascular Surgery

## 2011-12-09 ENCOUNTER — Encounter: Payer: Self-pay | Admitting: Vascular Surgery

## 2011-12-09 ENCOUNTER — Ambulatory Visit (INDEPENDENT_AMBULATORY_CARE_PROVIDER_SITE_OTHER): Payer: Medicare Other | Admitting: Vascular Surgery

## 2011-12-09 VITALS — BP 132/62 | HR 75 | Resp 16 | Ht 68.0 in | Wt 146.0 lb

## 2011-12-09 DIAGNOSIS — I714 Abdominal aortic aneurysm, without rupture: Secondary | ICD-10-CM

## 2011-12-09 NOTE — Progress Notes (Signed)
Subjective:     Patient ID: Lauren Morrow, female   DOB: 1924/11/13, 78 y.o.   MRN: [id number]  HPI this 78 year old female returns for initial followup regarding her abdominal aortic aneurysm resection which I performed about 3 weeks ago. This was a technically very difficult operation which included repairing a laceration of the left iliac vein with a bovine patch. She then required a return to the OR about 6 hours postoperatively for intra-abdominal bleeding from a lumbar branch within the aneurysm sac. She is actually done relatively well since that time but has required a stay in the [facility] nursing facility which is where she is now located. She is increasing her activity on daily basis. Her appetite is poor but her weight has been stable at 146 pounds. She is not requiring constant oxygen and does seem to be getting stronger with regular bowel habits.   Review of Systems     Objective:   Physical ExamBP 132/62  Pulse 75  Resp 16  Ht 5\' 8"  (1.727 m)  Wt 146 lb (66.225 kg)  BMI 22.20 kg/m2  Gen. elderly female alert oriented x3 in no apparent stress Lungs no rhonchi or wheezing Cardiovascular regular rhythm no murmurs Abdomen soft with no evidence of ventral hernia. Well-healed midline incision. Skin staples removed. Lower extremities with 3+ femoral pulses bilaterally and well perfused feet.     Assessment:     Doing well post double aortic aneurysm resection 3 weeks ago requiring repair of laceration left iliac vein with bovine patch and returned to the operating room for postoperative bleeding from lumbar artery branch.  Making steady progress at [facility] nursing facility     Plan:     Return to see me in 2 months Will leave decision regarding discharge home from nursing facility after Dr. Sherril Croon

## 2012-01-13 ENCOUNTER — Encounter: Payer: Self-pay | Admitting: Cardiovascular Disease

## 2012-01-13 ENCOUNTER — Ambulatory Visit (INDEPENDENT_AMBULATORY_CARE_PROVIDER_SITE_OTHER): Payer: Medicare Other | Admitting: Cardiovascular Disease

## 2012-01-13 VITALS — BP 131/70 | HR 68 | Ht 68.0 in | Wt 137.0 lb

## 2012-01-13 DIAGNOSIS — Z9889 Other specified postprocedural states: Secondary | ICD-10-CM

## 2012-01-13 DIAGNOSIS — Z952 Presence of prosthetic heart valve: Secondary | ICD-10-CM

## 2012-01-13 DIAGNOSIS — Z954 Presence of other heart-valve replacement: Secondary | ICD-10-CM

## 2012-01-13 DIAGNOSIS — I4891 Unspecified atrial fibrillation: Secondary | ICD-10-CM

## 2012-01-13 DIAGNOSIS — Z8679 Personal history of other diseases of the circulatory system: Secondary | ICD-10-CM

## 2012-01-13 MED ORDER — METOPROLOL TARTRATE 25 MG PO TABS: 25.0000 mg | ORAL_TABLET | Freq: Two times a day (BID) | ORAL | Status: DC

## 2012-01-13 NOTE — Progress Notes (Signed)
HPI  This is an 79 year old female who is here today for followup visit. She has a history of severe aortic stenosis status post aortic valve replacement with a bioprosthetic in November of 2012. She had postoperative atrial fibrillation that was treated with amiodarone and has resolved. He was diagnosed with abdominal aortic aneurysm which has progressed quickly to 5.4 cm in diameter.  She was evaluated with a CTA and was determined not to be a good candidate for stent graft. After an extensive discussion about her surgical risk, she underwent aortic aneurysm repair by Dr. Hart Rochester. She stayed in the hospital for 12 days and was discharged to a rehabilitation where she has been staying. She will be discharged from there later this week. Overall, she is getting stronger. She denies any chest pain. Her dyspnea is stable. No lower extremity edema.  Allergies  Allergen Reactions  . Avelox (Moxifloxacin Hcl In Nacl) Rash    Avelox began at approximately 0800. At 0807 pt noted to have red-streaking proximal to the PIV site, up the left arm, to the neck/chest area. Avelox immediately discontinued. Pt received about 1/4 the dose or 100mg . Vital signs remained stable. The anesthesiologist and surgeon were notified and shown the site of red, rashy streaking.  Marland Kitchen Penicillins Swelling  . Sulfa Antibiotics Other (See Comments)    unknown     Current Outpatient Prescriptions on File Prior to Visit  Medication Sig Dispense Refill  . aspirin EC 325 MG tablet Take 1 tablet (325 mg total) by mouth daily.  30 tablet  0  . b complex vitamins tablet Take 1 tablet by mouth daily.      . calcium-vitamin D (OSCAL WITH D) 500-200 MG-UNIT per tablet Take 1 tablet by mouth 2 (two) times daily.        . Cholecalciferol (VITAMIN D) 1000 UNITS capsule Take 1,000 Units by mouth daily.      . diphenhydrAMINE (BENADRYL) 12.5 MG/5ML elixir Take 10 mLs (25 mg total) by mouth at bedtime as needed for sleep.  120 mL  0  . fish  oil-omega-3 fatty acids 1000 MG capsule Take 1 g by mouth daily.        . meclizine (ANTIVERT) 25 MG tablet Take 25 mg by mouth as needed.      . metoprolol tartrate (LOPRESSOR) 25 MG tablet Take 1 tablet (25 mg total) by mouth 2 (two) times daily.  60 tablet  11  . Multiple Vitamin (MULTIVITAMIN) tablet Take 1 tablet by mouth daily.        Marland Kitchen omeprazole (PRILOSEC) 20 MG capsule Take 20 mg by mouth daily.      Marland Kitchen oxyCODONE-acetaminophen (PERCOCET/ROXICET) 5-325 MG per tablet Take 1 tablet by mouth every 4 (four) hours as needed.  30 tablet  0  . sertraline (ZOLOFT) 50 MG tablet Take 50 mg by mouth daily.          Past Medical History  Diagnosis Date  . Varicose veins   . Seasonal allergies   . Osteomyelitis     history of osteomyelistis in the leg  . Aortic stenosis     Dr. Terrilee Files, saw last 09/11/11  . Dizziness   . Peripheral vascular disease   . Hypertension     sees Dr. Sherril Croon, primary  . Atrial fibrillation     Post op, sees  Dr. Kirke Corin  . Anxiety   . Shortness of breath     "gets short of breath at time since the  heart surgery 2012"  . Urinary tract infection     hx of  . Kidney stones     hx of  . GERD (gastroesophageal reflux disease)   . Arthritis   . Anemia     hx of  . Low back pain   . Cataracts, bilateral      Past Surgical History  Procedure Date  . Spine surgery 1967  1976    dr. Ollen Bowl  . Aortic valve replacement 11/22/2010    21mm pericardial valve serial#[id number], model 3300tfx  . Cardiac catheterization   . Aortic valve replacement     In 2012 with a bioprosthetic valve  . Bladder tact     1960's  . Abdominal aortic aneurysm repair 11/21/2011    Procedure: ANEURYSM ABDOMINAL AORTIC REPAIR;  Surgeon: Pryor Ochoa, MD;  Location: [facility];  Service: Vascular;  Laterality: N/A;  using 16 x 8 mm x 40 cm hemashield graft  . Patch angioplasty 11/21/2011    Procedure: PATCH ANGIOPLASTY;  Surgeon: Pryor Ochoa, MD;  Location: [facility] OR;  Service: Vascular;   Laterality: Right;  Iliac vein  . Laparotomy 11/21/2011    Procedure: EXPLORATORY LAPAROTOMY;  Surgeon: Pryor Ochoa, MD;  Location: [facility] OR;  Service: Vascular;  Laterality: N/A;  exploratory laparotomy, evacuation of hematoma, suture ligation of lumbar arterial branch      Family History  Problem Relation Age of Onset  . Other Mother     Varicose Veins  . Cancer Sister   . Hyperlipidemia Brother   . Other Brother      History   Social History  . Marital Status: Widowed    Spouse Name: N/A    Number of Children: N/A  . Years of Education: N/A   Occupational History  . Not on file.   Social History Main Topics  . Smoking status: Never Smoker   . Smokeless tobacco: Never Used  . Alcohol Use: No  . Drug Use: No  . Sexually Active: Not on file   Other Topics Concern  . Not on file   Social History Narrative  . No narrative on file     PHYSICAL EXAM   BP 131/70  Pulse 68  Ht 5\' 8"  (1.727 m)  Wt 137 lb (62.143 kg)  BMI 20.83 kg/m2  Constitutional: She is oriented to person, place, and time. She appears well-developed and well-nourished. No distress.  HENT: No nasal discharge.  Head: Normocephalic and atraumatic.  Eyes: Pupils are equal and round. Right eye exhibits no discharge. Left eye exhibits no discharge.  Neck: Normal range of motion. Neck supple. No JVD present. No thyromegaly present.  Cardiovascular: Normal rate, regular rhythm, normal heart sounds. Exam reveals no gallop and no friction rub. There is a 1/6 systolic ejection murmur at the aortic area which is early peaking. Pulmonary/Chest: Effort normal and breath sounds normal. No stridor. No respiratory distress. She has no wheezes. She has no rales. She exhibits no tenderness.  Abdominal: Soft. Bowel sounds are normal. She exhibits no distension. There is no tenderness. There is no rebound and no guarding.  Musculoskeletal: Normal range of motion. She exhibits no edema and no tenderness.    Neurological: She is alert and oriented to person, place, and time. Coordination normal.  Skin: Skin is warm and dry. No rash noted. She is not diaphoretic. No erythema. No pallor.  Psychiatric: She has a normal mood and affect. Her behavior is normal. Judgment and thought content normal.  EKG: Sinus  Rhythm  -ST depression  -nonspecific  ABNORMAL   ASSESSMENT AND PLAN

## 2012-01-13 NOTE — Assessment & Plan Note (Signed)
She seems to be doing reasonably well from a cardiac standpoint. She has no symptoms suggestive of angina or arrhythmia. She had an echocardiogram done last year which showed normal LV systolic function with normal functioning prosthesis. Continue aspirin daily.

## 2012-01-13 NOTE — Patient Instructions (Addendum)
Continue same medications.  Follow up in 6 months.  

## 2012-01-13 NOTE — Assessment & Plan Note (Signed)
She is doing reasonably well after her recent abdominal aortic aneurysm repair. She will be going home from rehabilitation later this week.

## 2012-01-13 NOTE — Assessment & Plan Note (Signed)
This was only postoperative after her aortic valve replacement. She has been maintaining in sinus rhythm. Continue metoprolol.

## 2012-01-19 ENCOUNTER — Telehealth: Payer: Self-pay | Admitting: *Deleted

## 2012-01-19 NOTE — Telephone Encounter (Signed)
Merlyn Lot, nurse with [facility] - needs clarification on med for patient.  States we give last refill on 1/7 for Metoprolol Tart 25mg  twice a day, but patient has another rx for Metoprolol ER 25mg  - 1/2 tab twice a day given by Dr. Sherril Croon.  States she is due to see PMD tomorrow & wants to make sure meds are correct.

## 2012-01-20 NOTE — Telephone Encounter (Signed)
She should be on Metoprolol tartrate 25 mg bid.

## 2012-01-20 NOTE — Telephone Encounter (Signed)
Lauren Morrow, notified of below.

## 2012-02-09 ENCOUNTER — Encounter: Payer: Self-pay | Admitting: Vascular Surgery

## 2012-02-10 ENCOUNTER — Encounter: Payer: Self-pay | Admitting: Vascular Surgery

## 2012-02-10 ENCOUNTER — Ambulatory Visit (INDEPENDENT_AMBULATORY_CARE_PROVIDER_SITE_OTHER): Payer: Medicare Other | Admitting: Vascular Surgery

## 2012-02-10 VITALS — BP 153/67 | HR 58 | Ht 68.0 in | Wt 137.8 lb

## 2012-02-10 DIAGNOSIS — I714 Abdominal aortic aneurysm, without rupture, unspecified: Secondary | ICD-10-CM | POA: Insufficient documentation

## 2012-02-10 NOTE — Progress Notes (Signed)
Subjective:     Patient ID: Lauren Morrow, female   DOB: October 28, 1924, 79 y.o.   MRN: [id number]  HPI this 79 year old female returns for continued followup regarding her abdominal aortic aneurysm resection performed 11/21/2011. This was a difficult procedure complicated by a laceration of the left renal vein repaired with a bovine patch. She also retired return to the OR for bleeding postoperatively from a lumbar branch. She has done very well since her surgery following the reoperation. Her appetite is returning to normal and her bowel habits are good. Her weight is in the 130s compared to the 140s preoperatively. She is ambulating and back to living at home.    Review of Systems     Objective:   Physical ExamBP 153/67  Pulse 58  Ht 5\' 8"  (1.727 m)  Wt 137 lb 12.8 oz (62.506 kg)  BMI 20.95 kg/m2  SpO2 96%  General elderly female in no apparent stress alert and oriented x3 Lungs no rhonchi or wheezing Abdomen relatively soft no evidence of ventral hernia or pulsatile mass Extremities 3+ femoral pulses bilaterally with well-perfused lower extremities     Assessment:     Doing well 10 weeks post resection grafting abdominal aortic aneurysm requiring a reoperation for bleeding    Plan:     Will continue medical followup per Dr. Sherril Croon

## 2012-04-11 IMAGING — CR DG CHEST 1V PORT
1 series · 1 of 1 positions shown · non-contrast
Comparison: the previous day's study

CLINICAL DATA: Cardiac surgery

PORTABLE CHEST - 1 VIEW

[view not recorded]
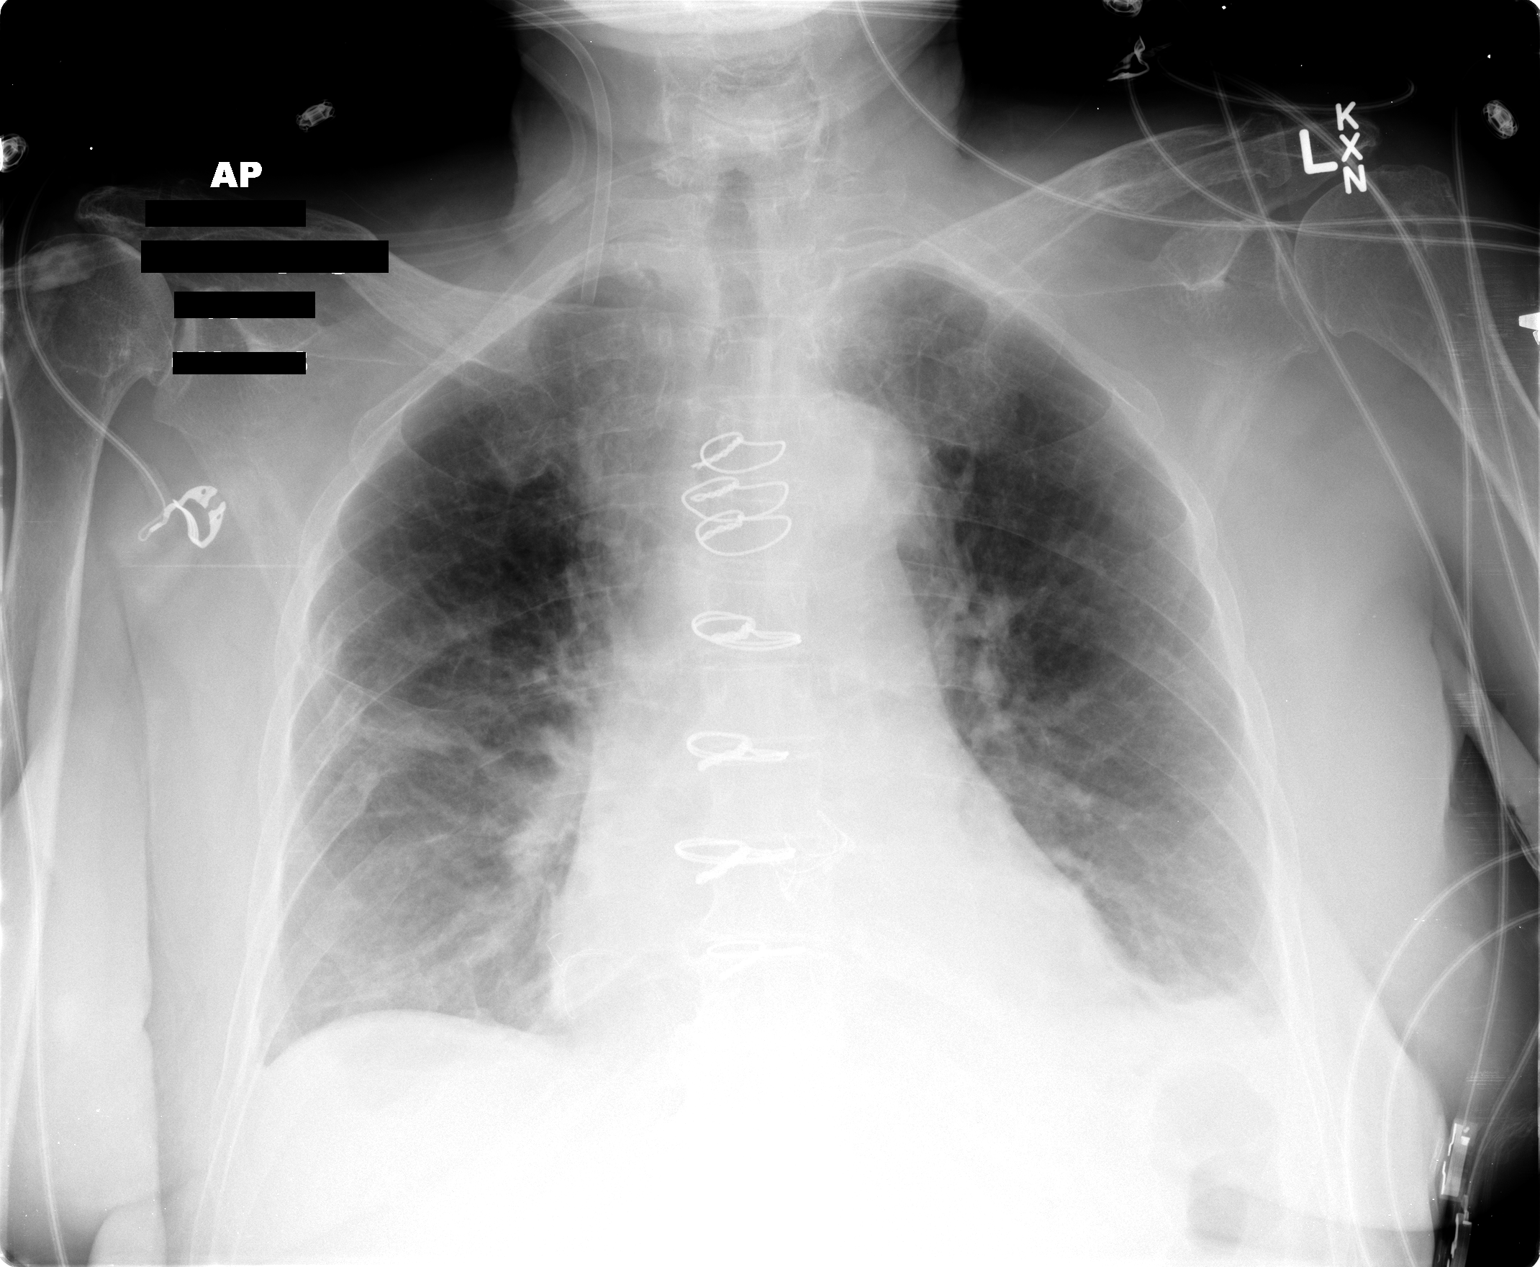

[1 of 1 positions shown; findings below may reference images not displayed]

FINDINGS: The Swan-Ganz catheter has been removed, leaving the IJ
venous sheath in place. Changes of median sternotomy and AVR.
Improvement in the interstitial edema. Mild residual perihilar and
bibasilar interstitial infiltrates, edema, or atelectasis.
Residual left infrahilar consolidation / atelectasis.  Probable
small residual pleural effusions.   Heart size normal.
Atheromatous aortic arch.
IMPRESSION: 1.  Interval improvement in interstitial edema with mild residual
bibasilar opacities.
2.  Suspect small pleural effusions.

## 2012-05-10 IMAGING — CR DG CHEST 2V
2 series · 2 of 2 positions shown · non-contrast
Comparison: 11/24/2010.

CLINICAL DATA: Follow-up surgery.

CHEST - 2 VIEW

[w chest ap]
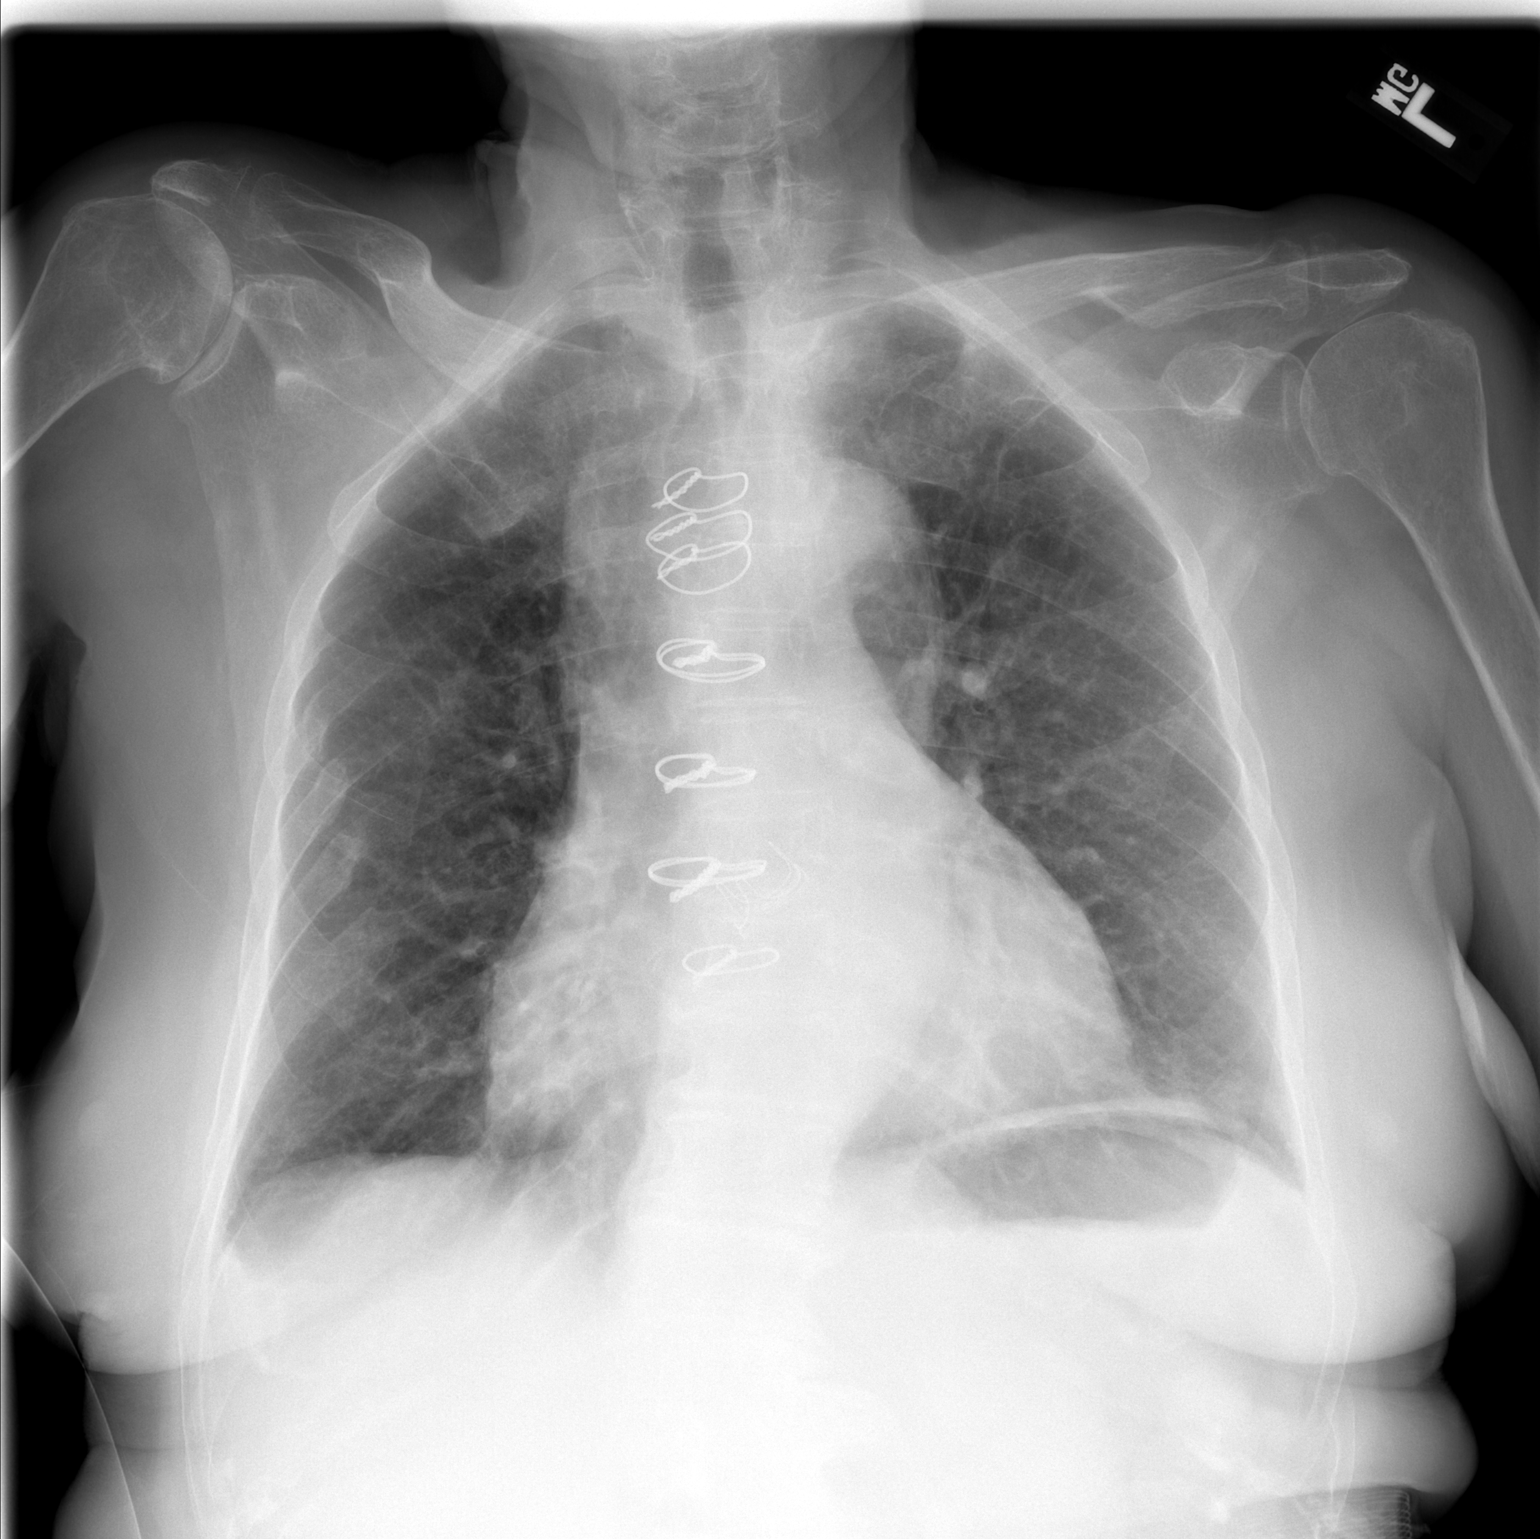

[w chest lat]
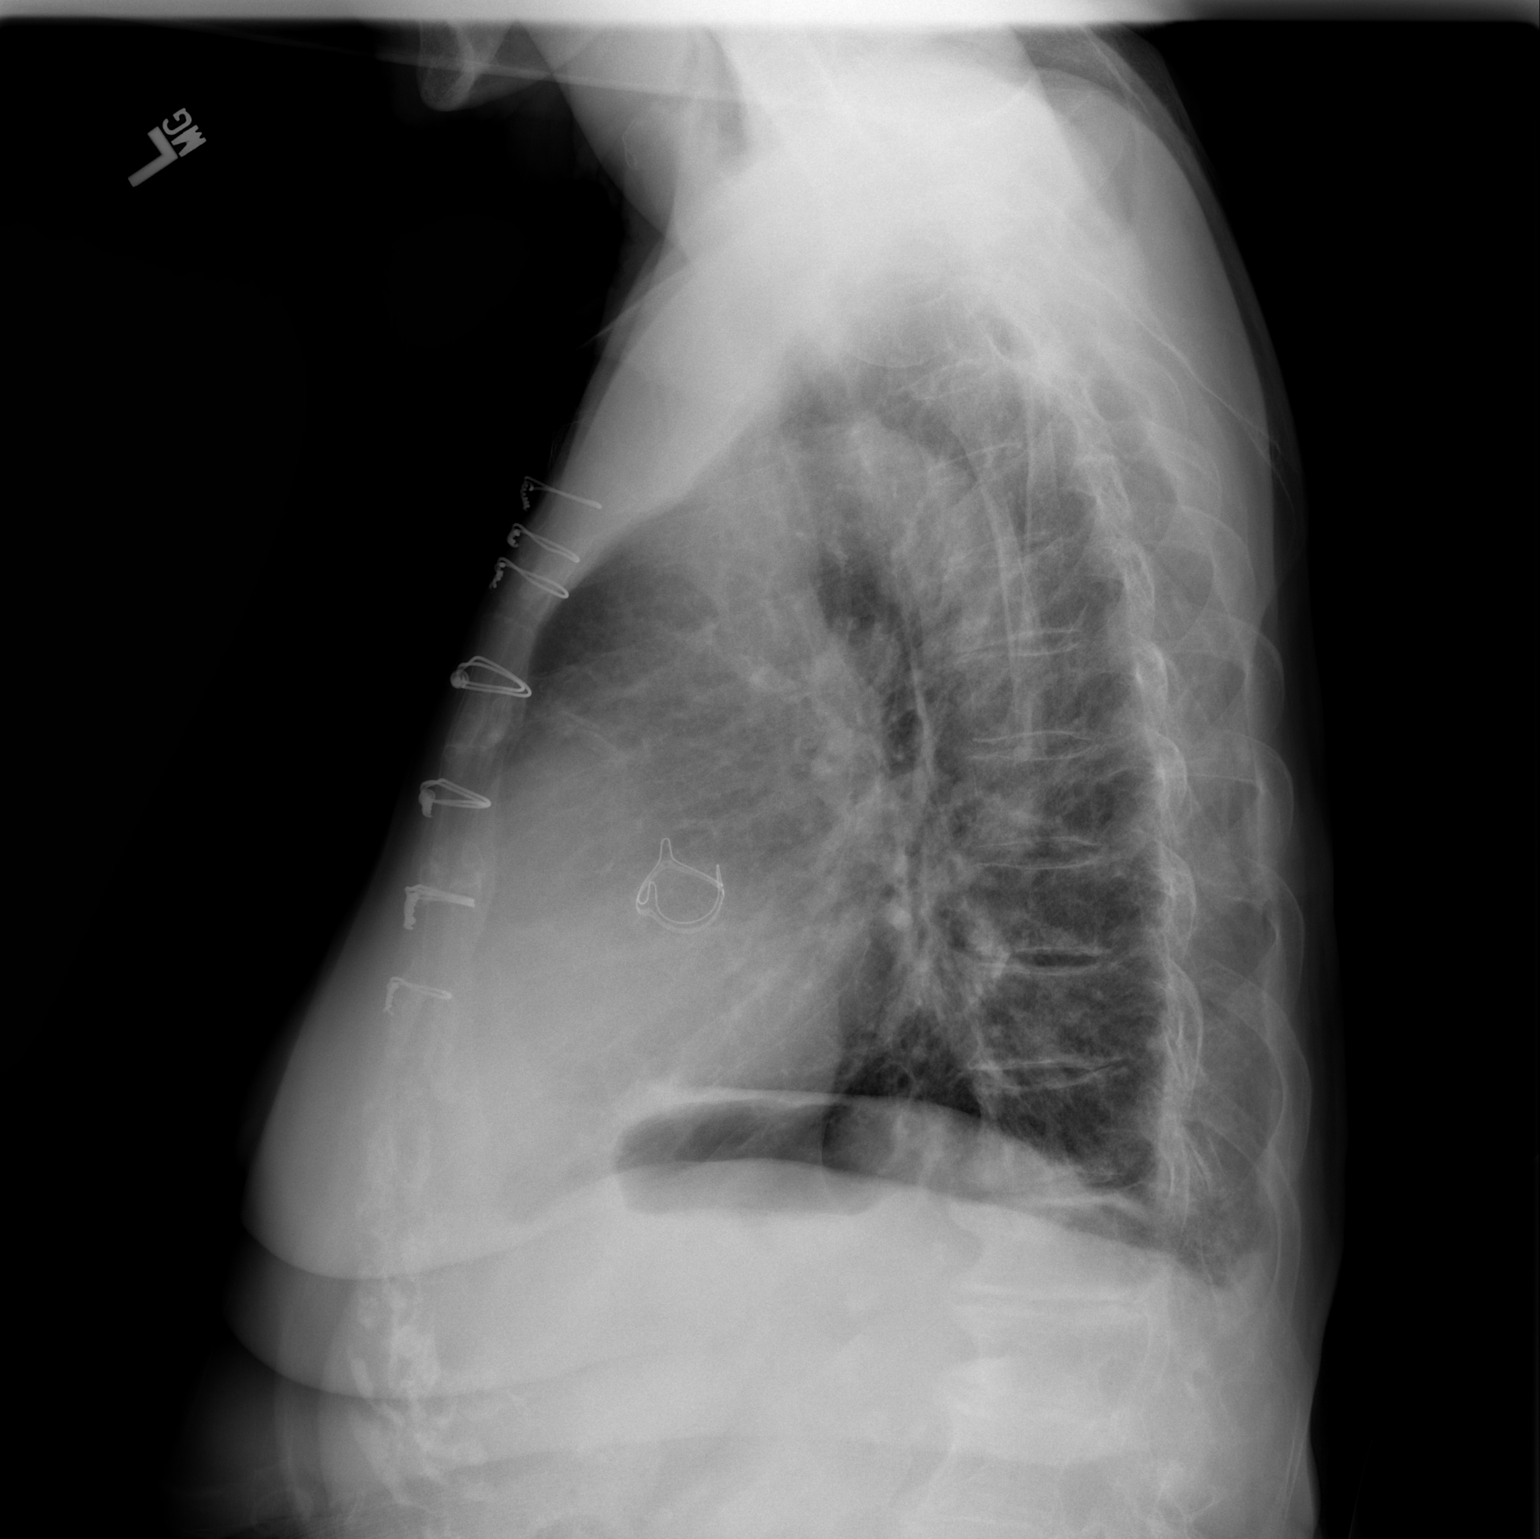

[2 of 2 positions shown; findings below may reference images not displayed]

FINDINGS: Trachea is midline.  Heart is mildly enlarged, stable.
Thoracic aorta is mildly tortuous.  There is improving bibasilar
aeration.  Tiny right pleural effusion persists.  Left pleural
effusion has resolved in the interval.  Old right rib fractures.
IMPRESSION: 1.  Improving bibasilar aeration with minimal residual left basilar
atelectasis.
2.  Tiny right pleural effusion.

## 2012-11-02 ENCOUNTER — Encounter: Payer: Medicare Other | Admitting: Cardiology

## 2012-11-02 NOTE — Progress Notes (Signed)
Clinical Summary Lauren Morrow is a 79 y.o.female   1. Abdominal aortic aneurysm - prior repair by Dr Hart Rochester  2. Severe aortic stenosis - s/p AVR with a bioprosthetic valve Nov 2012 - had postop afib that was treated with amio and resolved  Past Medical History  Diagnosis Date  . Varicose veins   . Seasonal allergies   . Osteomyelitis     history of osteomyelistis in the leg  . Aortic stenosis     Dr. Terrilee Files, saw last 09/11/11  . Dizziness   . Peripheral vascular disease   . Hypertension     sees Dr. Sherril Croon, primary  . Atrial fibrillation     Post op, sees  Dr. Kirke Corin  . Anxiety   . Shortness of breath     "gets short of breath at time since the heart surgery 2012"  . Urinary tract infection     hx of  . Kidney stones     hx of  . GERD (gastroesophageal reflux disease)   . Arthritis   . Anemia     hx of  . Low back pain   . Cataracts, bilateral      Allergies  Allergen Reactions  . Avelox [Moxifloxacin Hcl In Nacl] Rash    Avelox began at approximately 0800. At 0807 pt noted to have red-streaking proximal to the PIV site, up the left arm, to the neck/chest area. Avelox immediately discontinued. Pt received about 1/4 the dose or 100mg . Vital signs remained stable. The anesthesiologist and surgeon were notified and shown the site of red, rashy streaking.  Marland Kitchen Penicillins Swelling  . Sulfa Antibiotics Other (See Comments)    unknown     Current Outpatient Prescriptions  Medication Sig Dispense Refill  . aspirin EC 325 MG tablet Take 1 tablet (325 mg total) by mouth daily.  30 tablet  0  . b complex vitamins tablet Take 1 tablet by mouth daily.      . calcium-vitamin D (OSCAL WITH D) 500-200 MG-UNIT per tablet Take 1 tablet by mouth 2 (two) times daily.        . Cholecalciferol (VITAMIN D) 1000 UNITS capsule Take 1,000 Units by mouth daily.      . diphenhydrAMINE (BENADRYL) 12.5 MG/5ML elixir Take 10 mLs (25 mg total) by mouth at bedtime as needed for sleep.  120  mL  0  . fish oil-omega-3 fatty acids 1000 MG capsule Take 1 g by mouth daily.        . meclizine (ANTIVERT) 25 MG tablet Take 25 mg by mouth as needed.      . metoprolol tartrate (LOPRESSOR) 25 MG tablet Take 1 tablet (25 mg total) by mouth 2 (two) times daily.  60 tablet  11  . Multiple Vitamin (MULTIVITAMIN) tablet Take 1 tablet by mouth daily.        Marland Kitchen omeprazole (PRILOSEC) 20 MG capsule Take 20 mg by mouth daily.      Marland Kitchen oxyCODONE-acetaminophen (PERCOCET/ROXICET) 5-325 MG per tablet Take 1 tablet by mouth every 4 (four) hours as needed.  30 tablet  0  . sertraline (ZOLOFT) 50 MG tablet Take 50 mg by mouth daily.        No current facility-administered medications for this visit.     Past Surgical History  Procedure Laterality Date  . Spine surgery  1967  1976    dr. Ollen Bowl  . Aortic valve replacement  11/22/2010    21mm pericardial valve serial#[id number], model  3300tfx  . Cardiac catheterization    . Aortic valve replacement      In 2012 with a bioprosthetic valve  . Bladder tact      1960's  . Abdominal aortic aneurysm repair  11/21/2011    Procedure: ANEURYSM ABDOMINAL AORTIC REPAIR;  Surgeon: Pryor Ochoa, MD;  Location: [facility] OR;  Service: Vascular;  Laterality: N/A;  using 16 x 8 mm x 40 cm hemashield graft  . Patch angioplasty  11/21/2011    Procedure: PATCH ANGIOPLASTY;  Surgeon: Pryor Ochoa, MD;  Location: [facility] OR;  Service: Vascular;  Laterality: Right;  Iliac vein  . Laparotomy  11/21/2011    Procedure: EXPLORATORY LAPAROTOMY;  Surgeon: Pryor Ochoa, MD;  Location: [facility] OR;  Service: Vascular;  Laterality: N/A;  exploratory laparotomy, evacuation of hematoma, suture ligation of lumbar arterial Macario Shear      Allergies  Allergen Reactions  . Avelox [Moxifloxacin Hcl In Nacl] Rash    Avelox began at approximately 0800. At 0807 pt noted to have red-streaking proximal to the PIV site, up the left arm, to the neck/chest area. Avelox immediately discontinued. Pt received  about 1/4 the dose or 100mg . Vital signs remained stable. The anesthesiologist and surgeon were notified and shown the site of red, rashy streaking.  Marland Kitchen Penicillins Swelling  . Sulfa Antibiotics Other (See Comments)    unknown      Family History  Problem Relation Age of Onset  . Other Mother     Varicose Veins  . Cancer Sister   . Hyperlipidemia Brother   . Other Brother      Social History Ms. Matin reports that she has never smoked. She has never used smokeless tobacco. Ms. Heacock reports that she does not drink alcohol.   Review of Systems CONSTITUTIONAL: No weight loss, fever, chills, weakness or fatigue.  HEENT: Eyes: No visual loss, blurred vision, double vision or yellow sclerae.No hearing loss, sneezing, congestion, runny nose or sore throat.  SKIN: No rash or itching.  CARDIOVASCULAR:  RESPIRATORY: No shortness of breath, cough or sputum.  GASTROINTESTINAL: No anorexia, nausea, vomiting or diarrhea. No abdominal pain or blood.  GENITOURINARY: No burning on urination, no polyuria NEUROLOGICAL: No headache, dizziness, syncope, paralysis, ataxia, numbness or tingling in the extremities. No change in bowel or bladder control.  MUSCULOSKELETAL: No muscle, back pain, joint pain or stiffness.  LYMPHATICS: No enlarged nodes. No history of splenectomy.  PSYCHIATRIC: No history of depression or anxiety.  ENDOCRINOLOGIC: No reports of sweating, cold or heat intolerance. No polyuria or polydipsia.  Marland Kitchen   Physical Examination There were no vitals filed for this visit. There were no vitals filed for this visit.  Gen: resting comfortably, no acute distress HEENT: no scleral icterus, pupils equal round and reactive, no palptable cervical adenopathy,  CV Resp: Clear to auscultation bilaterally GI: abdomen is soft, non-tender, non-distended, normal bowel sounds, no hepatosplenomegaly MSK: extremities are warm, no edema.  Skin: warm, no rash Neuro:  no focal deficits Psych:  appropriate affect   Diagnostic Studies 11/2011 Echo: LVEF 65-70%, grade I diastolic dysfunction, normal AVR, mild MR, mild TR   Assessment and Plan        Antoine Poche, M.D., F.A.C.C.

## 2012-11-03 ENCOUNTER — Ambulatory Visit (INDEPENDENT_AMBULATORY_CARE_PROVIDER_SITE_OTHER): Payer: Medicare Other | Admitting: Cardiology

## 2012-11-03 ENCOUNTER — Encounter: Payer: Self-pay | Admitting: Cardiology

## 2012-11-03 VITALS — BP 110/66 | HR 57 | Ht 68.0 in | Wt 133.0 lb

## 2012-11-03 DIAGNOSIS — I359 Nonrheumatic aortic valve disorder, unspecified: Secondary | ICD-10-CM

## 2012-11-03 DIAGNOSIS — I350 Nonrheumatic aortic (valve) stenosis: Secondary | ICD-10-CM

## 2012-11-03 NOTE — Patient Instructions (Signed)
Your physician recommends that you schedule a follow-up appointment in: 1 year with Dr. Branch. You should receive a letter in the mail in 10 months. If you do not receive this letter by August 2015 call our office to schedule this appointment.   Your physician recommends that you continue on your current medications as directed. Please refer to the Current Medication list given to you today.   

## 2012-11-03 NOTE — Progress Notes (Signed)
Clinical Summary Lauren Morrow is a 79 y.o.female  1. Abdominal aortic aneurysm  - prior repair by Dr Hart Rochester  - patient denies any abdominal pain, continuing to follow with Dr Hart Rochester  2. Severe aortic stenosis  - s/p AVR with a bioprosthetic valve Nov 2012 - denies chest pain, has some SOB that is chronic , has some occasional orthostatic dizziness but no exertional symptoms, no syncope or presycnope   3. HTN - does not check at home - compliant with meds  4. HL - followed by PCP, she is on garlic and fish oil - no recent panel on our system   Past Medical History  Diagnosis Date  . Varicose veins   . Seasonal allergies   . Osteomyelitis     history of osteomyelistis in the leg  . Aortic stenosis     Dr. Terrilee Files, saw last 09/11/11  . Dizziness   . Peripheral vascular disease   . Hypertension     sees Dr. Sherril Croon, primary  . Atrial fibrillation     Post op, sees  Dr. Kirke Corin  . Anxiety   . Shortness of breath     "gets short of breath at time since the heart surgery 2012"  . Urinary tract infection     hx of  . Kidney stones     hx of  . GERD (gastroesophageal reflux disease)   . Arthritis   . Anemia     hx of  . Low back pain   . Cataracts, bilateral      Allergies  Allergen Reactions  . Avelox [Moxifloxacin Hcl In Nacl] Rash    Avelox began at approximately 0800. At 0807 pt noted to have red-streaking proximal to the PIV site, up the left arm, to the neck/chest area. Avelox immediately discontinued. Pt received about 1/4 the dose or 100mg . Vital signs remained stable. The anesthesiologist and surgeon were notified and shown the site of red, rashy streaking.  Marland Kitchen Penicillins Swelling  . Sulfa Antibiotics Other (See Comments)    unknown     Current Outpatient Prescriptions  Medication Sig Dispense Refill  . aspirin EC 325 MG tablet Take 1 tablet (325 mg total) by mouth daily.  30 tablet  0  . b complex vitamins tablet Take 1 tablet by mouth daily.      .  calcium-vitamin D (OSCAL WITH D) 500-200 MG-UNIT per tablet Take 1 tablet by mouth 2 (two) times daily.        . Cholecalciferol (VITAMIN D) 1000 UNITS capsule Take 1,000 Units by mouth daily.      . diphenhydrAMINE (BENADRYL) 12.5 MG/5ML elixir Take 10 mLs (25 mg total) by mouth at bedtime as needed for sleep.  120 mL  0  . fish oil-omega-3 fatty acids 1000 MG capsule Take 1 g by mouth daily.        . meclizine (ANTIVERT) 25 MG tablet Take 25 mg by mouth as needed.      . metoprolol tartrate (LOPRESSOR) 25 MG tablet Take 1 tablet (25 mg total) by mouth 2 (two) times daily.  60 tablet  11  . Multiple Vitamin (MULTIVITAMIN) tablet Take 1 tablet by mouth daily.        Marland Kitchen omeprazole (PRILOSEC) 20 MG capsule Take 20 mg by mouth daily.      Marland Kitchen oxyCODONE-acetaminophen (PERCOCET/ROXICET) 5-325 MG per tablet Take 1 tablet by mouth every 4 (four) hours as needed.  30 tablet  0  . sertraline (  ZOLOFT) 50 MG tablet Take 50 mg by mouth daily.        No current facility-administered medications for this visit.     Past Surgical History  Procedure Laterality Date  . Spine surgery  1967  1976    dr. Ollen Bowl  . Aortic valve replacement  11/22/2010    21mm pericardial valve serial#[id number], model 3300tfx  . Cardiac catheterization    . Aortic valve replacement      In 2012 with a bioprosthetic valve  . Bladder tact      1960's  . Abdominal aortic aneurysm repair  11/21/2011    Procedure: ANEURYSM ABDOMINAL AORTIC REPAIR;  Surgeon: Pryor Ochoa, MD;  Location: [facility] OR;  Service: Vascular;  Laterality: N/A;  using 16 x 8 mm x 40 cm hemashield graft  . Patch angioplasty  11/21/2011    Procedure: PATCH ANGIOPLASTY;  Surgeon: Pryor Ochoa, MD;  Location: [facility] OR;  Service: Vascular;  Laterality: Right;  Iliac vein  . Laparotomy  11/21/2011    Procedure: EXPLORATORY LAPAROTOMY;  Surgeon: Pryor Ochoa, MD;  Location: [facility] OR;  Service: Vascular;  Laterality: N/A;  exploratory laparotomy, evacuation of hematoma,  suture ligation of lumbar arterial Nickolaus Bordelon      Allergies  Allergen Reactions  . Avelox [Moxifloxacin Hcl In Nacl] Rash    Avelox began at approximately 0800. At 0807 pt noted to have red-streaking proximal to the PIV site, up the left arm, to the neck/chest area. Avelox immediately discontinued. Pt received about 1/4 the dose or 100mg . Vital signs remained stable. The anesthesiologist and surgeon were notified and shown the site of red, rashy streaking.  Marland Kitchen Penicillins Swelling  . Sulfa Antibiotics Other (See Comments)    unknown      Family History  Problem Relation Age of Onset  . Other Mother     Varicose Veins  . Cancer Sister   . Hyperlipidemia Brother   . Other Brother      Social History Ms. Pink reports that she has never smoked. She has never used smokeless tobacco. Ms. Peugh reports that she does not drink alcohol.   Review of Systems CONSTITUTIONAL: No weight loss, fever, chills, weakness or fatigue.  HEENT: Eyes: No visual loss, blurred vision, double vision or yellow sclerae.No hearing loss, sneezing, congestion, runny nose or sore throat.  SKIN: No rash or itching.  CARDIOVASCULAR: per HPI RESPIRATORY: per HPI GASTROINTESTINAL: No anorexia, nausea, vomiting or diarrhea. No abdominal pain or blood.  GENITOURINARY: No burning on urination, no polyuria NEUROLOGICAL: No headache, dizziness, syncope, paralysis, ataxia, numbness or tingling in the extremities. No change in bowel or bladder control.  MUSCULOSKELETAL: No muscle, back pain, joint pain or stiffness.  LYMPHATICS: No enlarged nodes. No history of splenectomy.  PSYCHIATRIC: No history of depression or anxiety.  ENDOCRINOLOGIC: No reports of sweating, cold or heat intolerance. No polyuria or polydipsia.  Marland Kitchen   Physical Examination p 57 bp 110/66 Wt 133 lbs BMI 20 Gen: resting comfortably, no acute distress HEENT: no scleral icterus, pupils equal round and reactive, no palptable cervical adenopathy,    CV: RRR, 2/6 systolic early peaking murmur RUSB, no JVD Resp: Clear to auscultation bilaterally GI: abdomen is soft, non-tender, non-distended, normal bowel sounds, no hepatosplenomegaly MSK: extremities are warm, no edema.  Skin: warm, no rash Neuro:  no focal deficits Psych: appropriate affect   Diagnostic Studies 11/2011 Echo: LVEF 65-70%, grade I diastolic dysfunction, normal AVR, mild MR, mild TR  Assessment and Plan  1. Aortic stenosis s/p replacement - no current symptoms - continue to follow clinically  2. Abdominal aortic aneurysm repair - patient doing well, continue to follow up with vascular as needed  3. HTN - at goal, continue current meds  4. HL - managed by pcp, will defer management     Antoine Poche, M.D., F.A.C.C.

## 2012-11-29 ENCOUNTER — Encounter (HOSPITAL_COMMUNITY): Payer: Self-pay

## 2012-11-29 ENCOUNTER — Encounter (HOSPITAL_COMMUNITY)
Admission: RE | Admit: 2012-11-29 | Discharge: 2012-11-29 | Disposition: A | Discharge status: Home / Self Care | Payer: Medicare Other | Source: Ambulatory Visit | Attending: Ophthalmology | Admitting: Ophthalmology

## 2012-11-29 ENCOUNTER — Encounter (HOSPITAL_COMMUNITY): Payer: Self-pay | Admitting: Pharmacy Technician

## 2012-11-29 DIAGNOSIS — Z01812 Encounter for preprocedural laboratory examination: Secondary | ICD-10-CM | POA: Insufficient documentation

## 2012-11-29 DIAGNOSIS — Z01818 Encounter for other preprocedural examination: Secondary | ICD-10-CM | POA: Insufficient documentation

## 2012-11-29 LAB — BASIC METABOLIC PANEL
BUN: 19 mg/dL (ref 6–23)
CO2: 31 mEq/L (ref 19–32)
Chloride: 101 mEq/L (ref 96–112)
Glucose, Bld: 76 mg/dL (ref 70–99)
Potassium: 5.2 mEq/L — ABNORMAL HIGH (ref 3.5–5.1)
Sodium: 141 mEq/L (ref 135–145)

## 2012-11-29 NOTE — Patient Instructions (Signed)
Your procedure is scheduled on: 12/09/2012  Report to [facility] at  800 AM.  Call this number if you have problems the morning of surgery: [phone]   Do not eat food or drink liquids :After Midnight.      Take these medicines the morning of surgery with A SIP OF WATER: antivert, metoprolol, prilosec, ditropan, evista, zoloft   Do not wear jewelry, make-up or nail polish.  Do not wear lotions, powders, or perfumes.  Do not shave 48 hours prior to surgery.  Do not bring valuables to the hospital.  Contacts, dentures or bridgework may not be worn into surgery.  Leave suitcase in the car. After surgery it may be brought to your room.  For patients admitted to the hospital, checkout time is 11:00 AM the day of discharge.   Patients discharged the day of surgery will not be allowed to drive home.  :     Please read over the following fact sheets that you were given: Coughing and Deep Breathing, Surgical Site Infection Prevention, Anesthesia Post-op Instructions and Care and Recovery After Surgery    Cataract A cataract is a clouding of the lens of the eye. When a lens becomes cloudy, vision is reduced based on the degree and nature of the clouding. Many cataracts reduce vision to some degree. Some cataracts make people more near-sighted as they develop. Other cataracts increase glare. Cataracts that are ignored and become worse can sometimes look white. The white color can be seen through the pupil. CAUSES   Aging. However, cataracts may occur at any age, even in newborns.   Certain drugs.   Trauma to the eye.   Certain diseases such as diabetes.   Specific eye diseases such as chronic inflammation inside the eye or a sudden attack of a rare form of glaucoma.   Inherited or acquired medical problems.  SYMPTOMS   Gradual, progressive drop in vision in the affected eye.   Severe, rapid visual loss. This most often happens when trauma is the cause.  DIAGNOSIS  To detect a  cataract, an eye doctor examines the lens. Cataracts are best diagnosed with an exam of the eyes with the pupils enlarged (dilated) by drops.  TREATMENT  For an early cataract, vision may improve by using different eyeglasses or stronger lighting. If that does not help your vision, surgery is the only effective treatment. A cataract needs to be surgically removed when vision loss interferes with your everyday activities, such as driving, reading, or watching TV. A cataract may also have to be removed if it prevents examination or treatment of another eye problem. Surgery removes the cloudy lens and usually replaces it with a substitute lens (intraocular lens, IOL).  At a time when both you and your doctor agree, the cataract will be surgically removed. If you have cataracts in both eyes, only one is usually removed at a time. This allows the operated eye to heal and be out of danger from any possible problems after surgery (such as infection or poor wound healing). In rare cases, a cataract may be doing damage to your eye. In these cases, your caregiver may advise surgical removal right away. The vast majority of people who have cataract surgery have better vision afterward. HOME CARE INSTRUCTIONS  If you are not planning surgery, you may be asked to do the following:  Use different eyeglasses.   Use stronger or brighter lighting.   Ask your eye doctor about reducing your medicine dose or  changing medicines if it is thought that a medicine caused your cataract. Changing medicines does not make the cataract go away on its own.   Become familiar with your surroundings. Poor vision can lead to injury. Avoid bumping into things on the affected side. You are at a higher risk for tripping or falling.   Exercise extreme care when driving or operating machinery.   Wear sunglasses if you are sensitive to bright light or experiencing problems with glare.  SEEK IMMEDIATE MEDICAL CARE IF:   You have a  worsening or sudden vision loss.   You notice redness, swelling, or increasing pain in the eye.   You have a fever.  Document Released: 12/23/2004 Document Revised: 12/12/2010 Document Reviewed: 08/16/2010 [facility] Patient Information 2012 [place], [place].PATIENT INSTRUCTIONS POST-ANESTHESIA  IMMEDIATELY FOLLOWING SURGERY:  Do not drive or operate machinery for the first twenty four hours after surgery.  Do not make any important decisions for twenty four hours after surgery or while taking narcotic pain medications or sedatives.  If you develop intractable nausea and vomiting or a severe headache please notify your doctor immediately.  FOLLOW-UP:  Please make an appointment with your surgeon as instructed. You do not need to follow up with anesthesia unless specifically instructed to do so.  WOUND CARE INSTRUCTIONS (if applicable):  Keep a dry clean dressing on the anesthesia/puncture wound site if there is drainage.  Once the wound has quit draining you may leave it open to air.  Generally you should leave the bandage intact for twenty four hours unless there is drainage.  If the epidural site drains for more than 36-48 hours please call the anesthesia department.  QUESTIONS?:  Please feel free to call your physician or the hospital operator if you have any questions, and they will be happy to assist you.

## 2012-12-01 NOTE — OR Nursing (Signed)
Potassium 5.2 reported to Dr. Jayme Cloud and orders are to repeat Istat on arrival to preop.

## 2012-12-08 MED ORDER — PHENYLEPHRINE HCL 2.5 % OP SOLN
OPHTHALMIC | Status: AC
  Filled 2012-12-08: qty 15

## 2012-12-08 MED ORDER — LIDOCAINE HCL (PF) 1 % IJ SOLN
INTRAMUSCULAR | Status: AC
  Filled 2012-12-08: qty 2

## 2012-12-08 MED ORDER — LIDOCAINE HCL 3.5 % OP GEL
OPHTHALMIC | Status: AC
  Filled 2012-12-08: qty 1

## 2012-12-08 MED ORDER — CYCLOPENTOLATE-PHENYLEPHRINE OP SOLN OPTIME - NO CHARGE
OPHTHALMIC | Status: AC
  Filled 2012-12-08: qty 2

## 2012-12-08 MED ORDER — TETRACAINE HCL 0.5 % OP SOLN
OPHTHALMIC | Status: AC
  Filled 2012-12-08: qty 2

## 2012-12-08 MED ORDER — NEOMYCIN-POLYMYXIN-DEXAMETH 3.5-10000-0.1 OP SUSP
OPHTHALMIC | Status: AC
  Filled 2012-12-08: qty 5

## 2012-12-09 ENCOUNTER — Ambulatory Visit (HOSPITAL_COMMUNITY)
Admission: RE | Admit: 2012-12-09 | Discharge: 2012-12-09 | Disposition: A | Discharge status: Home / Self Care | Payer: Medicare Other | Source: Ambulatory Visit | Attending: Ophthalmology | Admitting: Ophthalmology

## 2012-12-09 ENCOUNTER — Encounter (HOSPITAL_COMMUNITY)
Admission: RE | Disposition: A | Discharge status: Home / Self Care | Payer: Self-pay | Source: Ambulatory Visit | Attending: Ophthalmology

## 2012-12-09 ENCOUNTER — Encounter (HOSPITAL_COMMUNITY): Payer: Medicare Other | Admitting: Anesthesiology

## 2012-12-09 ENCOUNTER — Ambulatory Visit (HOSPITAL_COMMUNITY): Payer: Medicare Other | Admitting: Anesthesiology

## 2012-12-09 ENCOUNTER — Encounter (HOSPITAL_COMMUNITY): Payer: Self-pay | Admitting: *Deleted

## 2012-12-09 DIAGNOSIS — H2510 Age-related nuclear cataract, unspecified eye: Secondary | ICD-10-CM | POA: Insufficient documentation

## 2012-12-09 DIAGNOSIS — Z01812 Encounter for preprocedural laboratory examination: Secondary | ICD-10-CM | POA: Insufficient documentation

## 2012-12-09 DIAGNOSIS — I10 Essential (primary) hypertension: Secondary | ICD-10-CM | POA: Insufficient documentation

## 2012-12-09 DIAGNOSIS — I4891 Unspecified atrial fibrillation: Secondary | ICD-10-CM | POA: Insufficient documentation

## 2012-12-09 HISTORY — PX: CATARACT EXTRACTION W/PHACO: SHX586

## 2012-12-09 LAB — POCT I-STAT 4, (NA,K, GLUC, HGB,HCT)
Glucose, Bld: 80 mg/dL (ref 70–99)
HCT: 46 % (ref 36.0–46.0)
Potassium: 4 mEq/L (ref 3.5–5.1)

## 2012-12-09 SURGERY — PHACOEMULSIFICATION, CATARACT, WITH IOL INSERTION
Anesthesia: Monitor Anesthesia Care | Site: Eye | Laterality: Right

## 2012-12-09 MED ORDER — POVIDONE-IODINE 5 % OP SOLN
OPHTHALMIC | Status: DC | PRN
  Administered 2012-12-09: 1 via OPHTHALMIC

## 2012-12-09 MED ORDER — LACTATED RINGERS IV SOLN
INTRAVENOUS | Status: DC
  Administered 2012-12-09: 1000 mL via INTRAVENOUS

## 2012-12-09 MED ORDER — FENTANYL CITRATE 0.05 MG/ML IJ SOLN
INTRAMUSCULAR | Status: AC
  Filled 2012-12-09: qty 2

## 2012-12-09 MED ORDER — EPINEPHRINE HCL 1 MG/ML IJ SOLN
INTRAMUSCULAR | Status: AC
  Filled 2012-12-09: qty 1

## 2012-12-09 MED ORDER — ONDANSETRON HCL 4 MG/2ML IJ SOLN: 4.0000 mg | Freq: Once | INTRAMUSCULAR | Status: DC | PRN

## 2012-12-09 MED ORDER — LIDOCAINE HCL 3.5 % OP GEL
1.0000 "application " | Freq: Once | OPHTHALMIC | Status: AC
  Administered 2012-12-09: 1 via OPHTHALMIC

## 2012-12-09 MED ORDER — MIDAZOLAM HCL 2 MG/2ML IJ SOLN
1.0000 mg | INTRAMUSCULAR | Status: DC | PRN
  Administered 2012-12-09: 2 mg via INTRAVENOUS

## 2012-12-09 MED ORDER — BSS IO SOLN
INTRAOCULAR | Status: DC | PRN
  Administered 2012-12-09: 15 mL via INTRAOCULAR

## 2012-12-09 MED ORDER — TETRACAINE HCL 0.5 % OP SOLN
1.0000 [drp] | OPHTHALMIC | Status: AC
  Administered 2012-12-09 (×3): 1 [drp] via OPHTHALMIC

## 2012-12-09 MED ORDER — LIDOCAINE HCL (PF) 1 % IJ SOLN
INTRAMUSCULAR | Status: DC | PRN
  Administered 2012-12-09: .4 mL

## 2012-12-09 MED ORDER — FENTANYL CITRATE 0.05 MG/ML IJ SOLN: 25.0000 ug | INTRAMUSCULAR | Status: DC | PRN

## 2012-12-09 MED ORDER — MIDAZOLAM HCL 2 MG/2ML IJ SOLN
INTRAMUSCULAR | Status: AC
  Filled 2012-12-09: qty 2

## 2012-12-09 MED ORDER — CYCLOPENTOLATE-PHENYLEPHRINE 0.2-1 % OP SOLN
1.0000 [drp] | OPHTHALMIC | Status: AC
  Administered 2012-12-09 (×3): 1 [drp] via OPHTHALMIC

## 2012-12-09 MED ORDER — PHENYLEPHRINE HCL 2.5 % OP SOLN
1.0000 [drp] | OPHTHALMIC | Status: AC
  Administered 2012-12-09 (×3): 1 [drp] via OPHTHALMIC

## 2012-12-09 MED ORDER — EPINEPHRINE HCL 1 MG/ML IJ SOLN
INTRAOCULAR | Status: DC | PRN
  Administered 2012-12-09: 10:00:00

## 2012-12-09 MED ORDER — PROVISC 10 MG/ML IO SOLN
INTRAOCULAR | Status: DC | PRN
  Administered 2012-12-09: 0.85 mL via INTRAOCULAR

## 2012-12-09 SURGICAL SUPPLY — 32 items

## 2012-12-09 NOTE — Op Note (Signed)
Date of Admission: 12/09/2012  Date of Surgery: 12/09/2012  Pre-Op Dx: Cataract  Right  Eye  Post-Op Dx: Cataract  Right  Eye,  Dx Code 366.16  Surgeon: Gemma Payor, M.D.  Assistants: None  Anesthesia: Topical with MAC  Indications: Painless, progressive loss of vision with compromise of daily activities.  Surgery: Cataract Extraction with Intraocular lens Implant Right Eye  Discription: The patient had dilating drops and viscous lidocaine placed into the right eye in the pre-op holding area. After transfer to the operating room, a time out was performed. The patient was then prepped and draped. Beginning with a 75 degree blade a paracentesis port was made at the surgeon's 2 o'clock position. The anterior chamber was then filled with 1% non-preserved lidocaine. This was followed by filling the anterior chamber with Provisc.  A 2.56mm keratome blade was used to make a clear corneal incision at the temporal limbus.  A bent cystatome needle was used to create a continuous tear capsulotomy. Hydrodissection was performed with balanced salt solution on a Fine canula. The lens nucleus was then removed using the phacoemulsification handpiece. Residual cortex was removed with the I&A handpiece. The anterior chamber and capsular bag were refilled with Provisc. A posterior chamber intraocular lens was placed into the capsular bag with it's injector. The implant was positioned with the Kuglan hook. The Provisc was then removed from the anterior chamber and capsular bag with the I&A handpiece. Stromal hydration of the main incision and paracentesis port was performed with BSS on a Fine canula. The wounds were tested for leak which was negative. The patient tolerated the procedure well. There were no operative complications. The patient was then transferred to the recovery room in stable condition.  Complications: None  Specimen: None  EBL: None  Prosthetic device: B&L enVista, MX60, power 20.5D, SN  [id number].

## 2012-12-09 NOTE — Transfer of Care (Signed)
Immediate Anesthesia Transfer of Care Note  Patient: Lauren Morrow  Procedure(s) Performed: Procedure(s) with comments: CATARACT EXTRACTION PHACO AND INTRAOCULAR LENS PLACEMENT (IOC) (Right) - CDE 19.32  Patient Location: Short Stay  Anesthesia Type:MAC  Level of Consciousness: awake, alert , oriented and patient cooperative  Airway & Oxygen Therapy: Patient Spontanous Breathing  Post-op Assessment: Report given to PACU RN, Post -op Vital signs reviewed and stable and Patient moving all extremities  Post vital signs: Reviewed and stable  Complications: No apparent anesthesia complications

## 2012-12-09 NOTE — Anesthesia Preprocedure Evaluation (Signed)
Anesthesia Evaluation  Patient identified by MRN, date of birth, ID band Patient awake    Reviewed: Allergy & Precautions, H&P , NPO status , Patient's Chart, lab work & pertinent test results  Airway Mallampati: I TM Distance: >3 FB Neck ROM: full    Dental   Pulmonary shortness of breath,  breath sounds clear to auscultation        Cardiovascular hypertension, + Peripheral Vascular Disease + dysrhythmias Atrial Fibrillation + Valvular Problems/Murmurs Rhythm:irregular Rate:Normal     Neuro/Psych PSYCHIATRIC DISORDERS Anxiety    GI/Hepatic GERD-  ,  Endo/Other    Renal/GU      Musculoskeletal   Abdominal   Peds  Hematology  (+) Blood dyscrasia, anemia ,   Anesthesia Other Findings   Reproductive/Obstetrics                           Anesthesia Physical Anesthesia Plan  ASA: III  Anesthesia Plan: MAC   Post-op Pain Management:    Induction: Intravenous  Airway Management Planned: Nasal Cannula  Additional Equipment:   Intra-op Plan:   Post-operative Plan:   Informed Consent: I have reviewed the patients History and Physical, chart, labs and discussed the procedure including the risks, benefits and alternatives for the proposed anesthesia with the patient or authorized representative who has indicated his/her understanding and acceptance.     Plan Discussed with:   Anesthesia Plan Comments:         Anesthesia Quick Evaluation

## 2012-12-09 NOTE — Anesthesia Postprocedure Evaluation (Signed)
  Anesthesia Post-op Note  Patient: Lauren Morrow  Procedure(s) Performed: Procedure(s) with comments: CATARACT EXTRACTION PHACO AND INTRAOCULAR LENS PLACEMENT (IOC) (Right) - CDE 19.32  Patient Location: Short Stay  Anesthesia Type:MAC  Level of Consciousness: awake, alert , oriented and patient cooperative  Airway and Oxygen Therapy: Patient Spontanous Breathing  Post-op Pain: none  Post-op Assessment: Post-op Vital signs reviewed, Patient's Cardiovascular Status Stable, Respiratory Function Stable, Patent Airway and Pain level controlled  Post-op Vital Signs: Reviewed and stable  Complications: No apparent anesthesia complications

## 2012-12-09 NOTE — H&P (Signed)
I have reviewed the H&P, the patient was re-examined, and I have identified no interval changes in medical condition and plan of care since the history and physical of record  

## 2012-12-13 ENCOUNTER — Encounter (HOSPITAL_COMMUNITY): Payer: Self-pay | Admitting: Ophthalmology

## 2012-12-13 MED ORDER — NEOMYCIN-POLYMYXIN-DEXAMETH 3.5-10000-0.1 OP SUSP
OPHTHALMIC | Status: DC | PRN
  Administered 2012-12-09: 2 [drp] via OPHTHALMIC

## 2012-12-22 ENCOUNTER — Encounter (HOSPITAL_COMMUNITY): Payer: Self-pay | Admitting: Pharmacy Technician

## 2012-12-23 ENCOUNTER — Encounter (HOSPITAL_COMMUNITY): Payer: Self-pay

## 2012-12-23 ENCOUNTER — Encounter (HOSPITAL_COMMUNITY)
Admission: RE | Admit: 2012-12-23 | Discharge: 2012-12-23 | Disposition: A | Discharge status: Home / Self Care | Payer: Medicare Other | Source: Ambulatory Visit | Attending: Ophthalmology | Admitting: Ophthalmology

## 2012-12-27 ENCOUNTER — Ambulatory Visit (HOSPITAL_COMMUNITY): Payer: Medicare Other | Admitting: Anesthesiology

## 2012-12-27 ENCOUNTER — Encounter (HOSPITAL_COMMUNITY)
Admission: RE | Disposition: A | Discharge status: Home / Self Care | Payer: Self-pay | Source: Ambulatory Visit | Attending: Ophthalmology

## 2012-12-27 ENCOUNTER — Ambulatory Visit (HOSPITAL_COMMUNITY)
Admission: RE | Admit: 2012-12-27 | Discharge: 2012-12-27 | Disposition: A | Discharge status: Home / Self Care | Payer: Medicare Other | Source: Ambulatory Visit | Attending: Ophthalmology | Admitting: Ophthalmology

## 2012-12-27 ENCOUNTER — Encounter (HOSPITAL_COMMUNITY): Payer: Self-pay

## 2012-12-27 ENCOUNTER — Encounter (HOSPITAL_COMMUNITY): Payer: Medicare Other | Admitting: Anesthesiology

## 2012-12-27 DIAGNOSIS — H2510 Age-related nuclear cataract, unspecified eye: Secondary | ICD-10-CM | POA: Insufficient documentation

## 2012-12-27 DIAGNOSIS — I10 Essential (primary) hypertension: Secondary | ICD-10-CM | POA: Insufficient documentation

## 2012-12-27 HISTORY — PX: CATARACT EXTRACTION W/PHACO: SHX586

## 2012-12-27 SURGERY — PHACOEMULSIFICATION, CATARACT, WITH IOL INSERTION
Anesthesia: Monitor Anesthesia Care | Site: Eye | Laterality: Left

## 2012-12-27 MED ORDER — MIDAZOLAM HCL 2 MG/2ML IJ SOLN
INTRAMUSCULAR | Status: AC
  Filled 2012-12-27: qty 2

## 2012-12-27 MED ORDER — MIDAZOLAM HCL 2 MG/2ML IJ SOLN
1.0000 mg | INTRAMUSCULAR | Status: DC | PRN
  Administered 2012-12-27: 2 mg via INTRAVENOUS

## 2012-12-27 MED ORDER — ONDANSETRON HCL 4 MG/2ML IJ SOLN: 4.0000 mg | Freq: Once | INTRAMUSCULAR | Status: DC | PRN

## 2012-12-27 MED ORDER — EPINEPHRINE HCL 1 MG/ML IJ SOLN
INTRAOCULAR | Status: DC | PRN
  Administered 2012-12-27: 11:00:00

## 2012-12-27 MED ORDER — FENTANYL CITRATE 0.05 MG/ML IJ SOLN
25.0000 ug | INTRAMUSCULAR | Status: AC
  Administered 2012-12-27: 25 ug via INTRAVENOUS

## 2012-12-27 MED ORDER — TETRACAINE HCL 0.5 % OP SOLN
1.0000 [drp] | OPHTHALMIC | Status: AC
  Administered 2012-12-27 (×3): 1 [drp] via OPHTHALMIC

## 2012-12-27 MED ORDER — LACTATED RINGERS IV SOLN
INTRAVENOUS | Status: DC
  Administered 2012-12-27: 11:00:00 via INTRAVENOUS

## 2012-12-27 MED ORDER — KETOROLAC TROMETHAMINE 0.5 % OP SOLN: 1.0000 [drp] | OPHTHALMIC | Status: DC

## 2012-12-27 MED ORDER — POVIDONE-IODINE 5 % OP SOLN
OPHTHALMIC | Status: DC | PRN
  Administered 2012-12-27: 1 via OPHTHALMIC

## 2012-12-27 MED ORDER — LIDOCAINE HCL 3.5 % OP GEL
1.0000 "application " | Freq: Once | OPHTHALMIC | Status: AC
  Administered 2012-12-27: 1 via OPHTHALMIC

## 2012-12-27 MED ORDER — LIDOCAINE HCL (PF) 1 % IJ SOLN
INTRAMUSCULAR | Status: DC | PRN
  Administered 2012-12-27: .5 mL

## 2012-12-27 MED ORDER — CYCLOPENTOLATE-PHENYLEPHRINE 0.2-1 % OP SOLN
1.0000 [drp] | OPHTHALMIC | Status: AC
  Administered 2012-12-27 (×3): 1 [drp] via OPHTHALMIC

## 2012-12-27 MED ORDER — PHENYLEPHRINE HCL 2.5 % OP SOLN
1.0000 [drp] | OPHTHALMIC | Status: AC
  Administered 2012-12-27 (×3): 1 [drp] via OPHTHALMIC

## 2012-12-27 MED ORDER — FENTANYL CITRATE 0.05 MG/ML IJ SOLN: 25.0000 ug | INTRAMUSCULAR | Status: DC | PRN

## 2012-12-27 MED ORDER — BSS IO SOLN
INTRAOCULAR | Status: DC | PRN
  Administered 2012-12-27: 15 mL via INTRAOCULAR

## 2012-12-27 MED ORDER — PROVISC 10 MG/ML IO SOLN
INTRAOCULAR | Status: DC | PRN
  Administered 2012-12-27: 0.85 mL via INTRAOCULAR

## 2012-12-27 MED ORDER — FENTANYL CITRATE 0.05 MG/ML IJ SOLN
INTRAMUSCULAR | Status: AC
  Filled 2012-12-27: qty 2

## 2012-12-27 SURGICAL SUPPLY — 32 items

## 2012-12-27 NOTE — Op Note (Signed)
Date of Admission: 12/27/2012  Date of Surgery: 12/27/2012  Pre-Op Dx: Cataract  Left  Eye  Post-Op Dx: Cataract  Left  Eye,  Dx Code 366.16  Surgeon: Gemma Payor, M.D.  Assistants: None  Anesthesia: Topical with MAC  Indications: Painless, progressive loss of vision with compromise of daily activities.  Surgery: Cataract Extraction with Intraocular lens Implant Left Eye  Discription: The patient had dilating drops and viscous lidocaine placed into the left eye in the pre-op holding area. After transfer to the operating room, a time out was performed. The patient was then prepped and draped. Beginning with a 75 degree blade a paracentesis port was made at the surgeon's 2 o'clock position. The anterior chamber was then filled with 1% non-preserved lidocaine. This was followed by filling the anterior chamber with Provisc. A 2.68mm keratome blade was used to make a clear corneal incision at the temporal limbus. A bent cystatome needle was used to create a continuous tear capsulotomy. Hydrodissection was performed with balanced salt solution on a Fine canula. The lens nucleus was then removed using the phacoemulsification handpiece. Residual cortex was removed with the I&A handpiece. The anterior chamber and capsular bag were refilled with Provisc. A posterior chamber intraocular lens was placed into the capsular bag with it's injector. The implant was positioned with the Kuglan hook. The Provisc was then removed from the anterior chamber and capsular bag with the I&A handpiece. Stromal hydration of the main incision and paracentesis port was performed with BSS on a Fine canula. The wounds were tested for leak which was negative. The patient tolerated the procedure well. There were no operative complications. The patient was then transferred to the recovery room in stable condition.  Complications: None  Specimen: None  EBL: None  Prosthetic device: B&L enVista, MX60, power 20.0D, SN  [id number].

## 2012-12-27 NOTE — Anesthesia Postprocedure Evaluation (Signed)
  Anesthesia Post-op Note  Patient: Lauren Morrow  Procedure(s) Performed: Procedure(s) (LRB): LEFT EYE CATARACT EXTRACTION PHACO AND INTRAOCULAR LENS PLACEMENT  (Left)  Patient Location:  Short Stay  Anesthesia Type: MAC  Level of Consciousness: awake  Airway and Oxygen Therapy: Patient Spontanous Breathing  Post-op Pain: none  Post-op Assessment: Post-op Vital signs reviewed, Patient's Cardiovascular Status Stable, Respiratory Function Stable, Patent Airway, No signs of Nausea or vomiting and Pain level controlled  Post-op Vital Signs: Reviewed and stable  Complications: No apparent anesthesia complications

## 2012-12-27 NOTE — Anesthesia Procedure Notes (Signed)
Procedure Name: MAC Date/Time: 12/27/2012 11:21 AM Performed by: Franco Nones Pre-anesthesia Checklist: Patient identified, Emergency Drugs available, Suction available, Timeout performed and Patient being monitored Patient Re-evaluated:Patient Re-evaluated prior to inductionOxygen Delivery Method: Nasal Cannula

## 2012-12-27 NOTE — Transfer of Care (Signed)
Immediate Anesthesia Transfer of Care Note  Patient: Lauren Morrow  Procedure(s) Performed: Procedure(s) (LRB): LEFT EYE CATARACT EXTRACTION PHACO AND INTRAOCULAR LENS PLACEMENT  (Left)  Patient Location: Shortstay  Anesthesia Type: MAC  Level of Consciousness: awake  Airway & Oxygen Therapy: Patient Spontanous Breathing   Post-op Assessment: Report given to PACU RN, Post -op Vital signs reviewed and stable and Patient moving all extremities  Post vital signs: Reviewed and stable  Complications: No apparent anesthesia complications

## 2012-12-27 NOTE — H&P (Signed)
I have reviewed the H&P, the patient was re-examined, and I have identified no interval changes in medical condition and plan of care since the history and physical of record  

## 2012-12-27 NOTE — Anesthesia Preprocedure Evaluation (Signed)
Anesthesia Evaluation  Patient identified by MRN, date of birth, ID band Patient awake    Reviewed: Allergy & Precautions, H&P , NPO status , Patient's Chart, lab work & pertinent test results  Airway Mallampati: I TM Distance: >3 FB Neck ROM: full    Dental   Pulmonary shortness of breath,  breath sounds clear to auscultation        Cardiovascular hypertension, + Peripheral Vascular Disease + dysrhythmias Atrial Fibrillation + Valvular Problems/Murmurs Rhythm:irregular Rate:Normal     Neuro/Psych PSYCHIATRIC DISORDERS Anxiety    GI/Hepatic GERD-  ,  Endo/Other    Renal/GU      Musculoskeletal   Abdominal   Peds  Hematology  (+) Blood dyscrasia, anemia ,   Anesthesia Other Findings   Reproductive/Obstetrics                           Anesthesia Physical Anesthesia Plan  ASA: III  Anesthesia Plan: MAC   Post-op Pain Management:    Induction: Intravenous  Airway Management Planned: Nasal Cannula  Additional Equipment:   Intra-op Plan:   Post-operative Plan:   Informed Consent: I have reviewed the patients History and Physical, chart, labs and discussed the procedure including the risks, benefits and alternatives for the proposed anesthesia with the patient or authorized representative who has indicated his/her understanding and acceptance.     Plan Discussed with:   Anesthesia Plan Comments:         Anesthesia Quick Evaluation  

## 2012-12-28 ENCOUNTER — Encounter (HOSPITAL_COMMUNITY): Payer: Self-pay | Admitting: Ophthalmology

## 2012-12-28 MED ORDER — NEOMYCIN-POLYMYXIN-DEXAMETH 3.5-10000-0.1 OP SUSP
OPHTHALMIC | Status: DC | PRN
  Administered 2012-12-27: 2 [drp] via OPHTHALMIC

## 2013-01-04 ENCOUNTER — Telehealth: Payer: Self-pay | Admitting: Cardiology

## 2013-01-04 MED ORDER — METOPROLOL TARTRATE 25 MG PO TABS: 25.0000 mg | ORAL_TABLET | Freq: Two times a day (BID) | ORAL | Status: DC

## 2013-01-04 NOTE — Telephone Encounter (Signed)
metoprolol tartrate (LOPRESSOR) 25 MG tablet  Take 1 tablet (25 mg total) by mouth 2 (two) times daily.  60 tablet  Needs refill

## 2013-04-14 IMAGING — CR DG ABD PORTABLE 1V
1 series · 1 of 1 positions shown · non-contrast
Comparison: Portable abdomen of 11/21/2011

CLINICAL DATA: Abdominal distention

PORTABLE ABDOMEN - 1 VIEW

[AP]
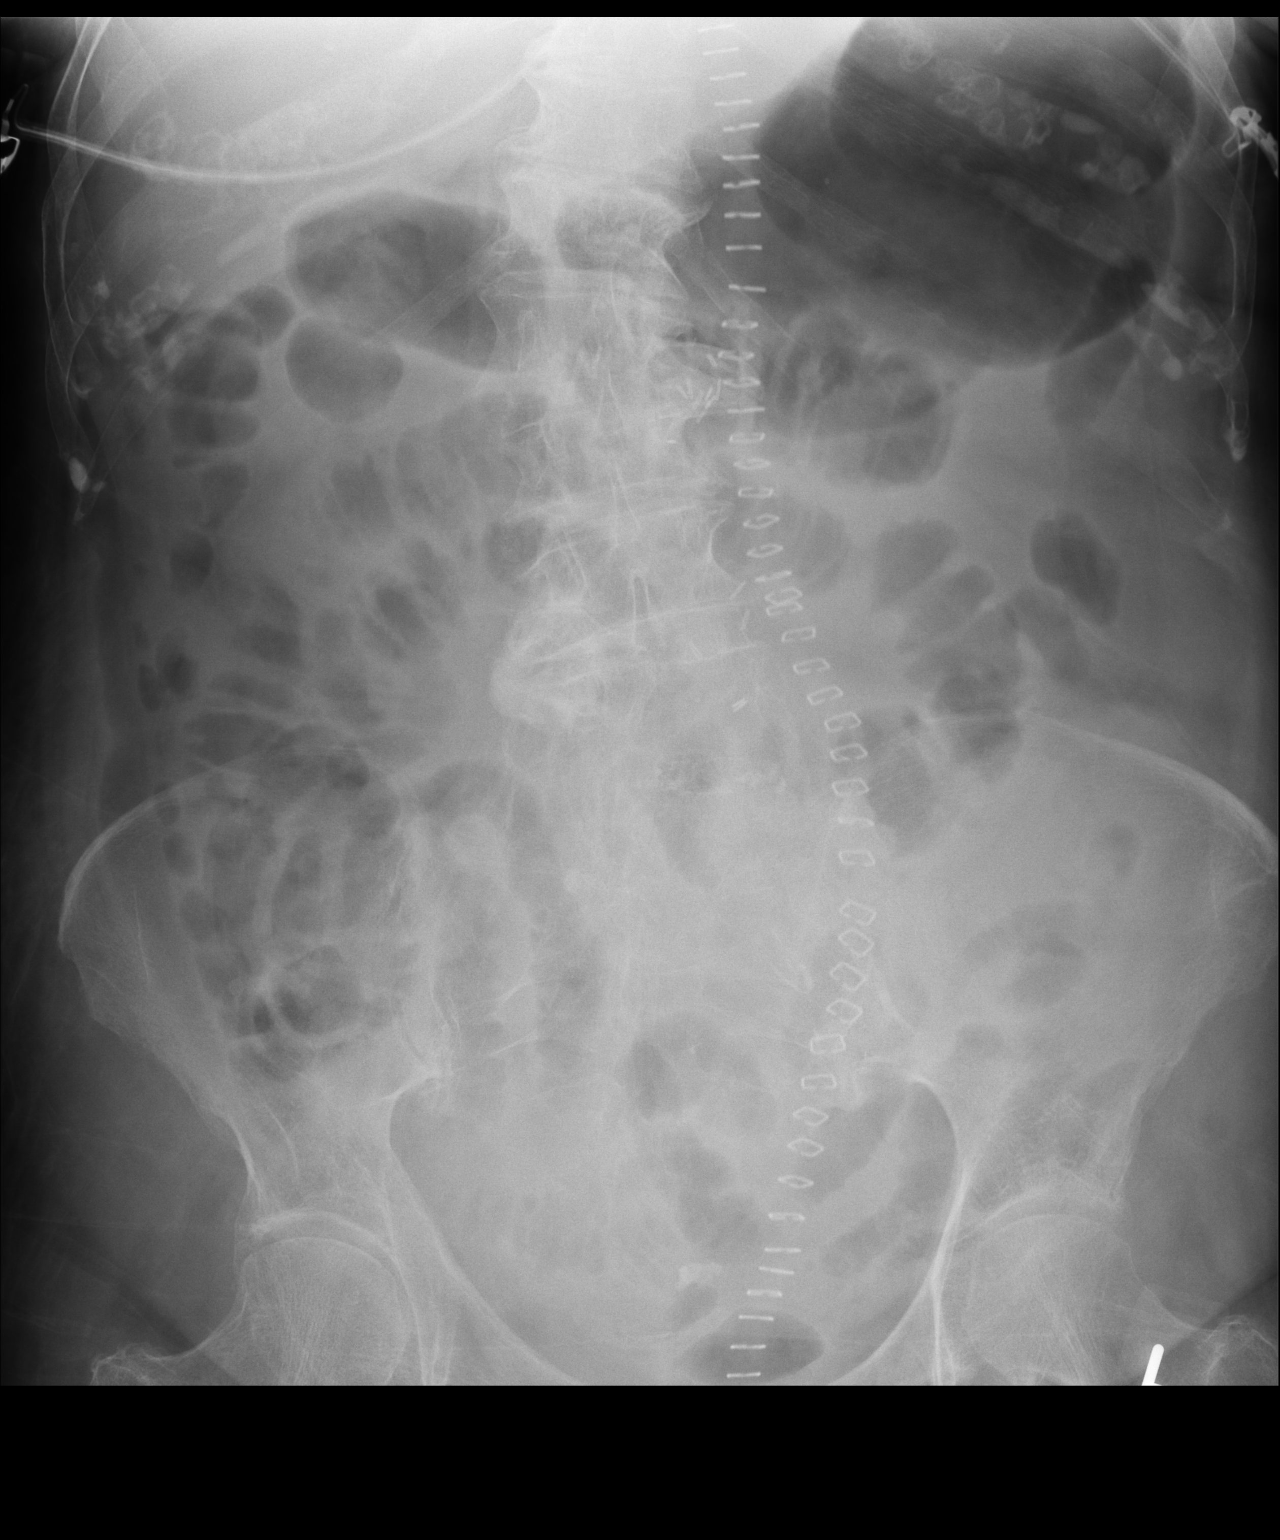

[1 of 1 positions shown; findings below may reference images not displayed]

FINDINGS: Both large and small bowel gas is present without
significant distention.  The NG tube has been removed.  Surgical
staples overlie the midline of the abdomen and pelvis.  Lumbar
scoliosis convex to the right is noted with diffuse degenerative
change.
IMPRESSION: Both large and small bowel gas is present without significant
distention.

## 2013-04-16 IMAGING — CR DG ABDOMEN 2V
2 series · 2 of 2 positions shown · non-contrast
Comparison: 11/27/2011

CLINICAL DATA: Abdominal pain and bloating after aneurysm repair 4
days ago.

ABDOMEN - 2 VIEW

[t abdomen supine]
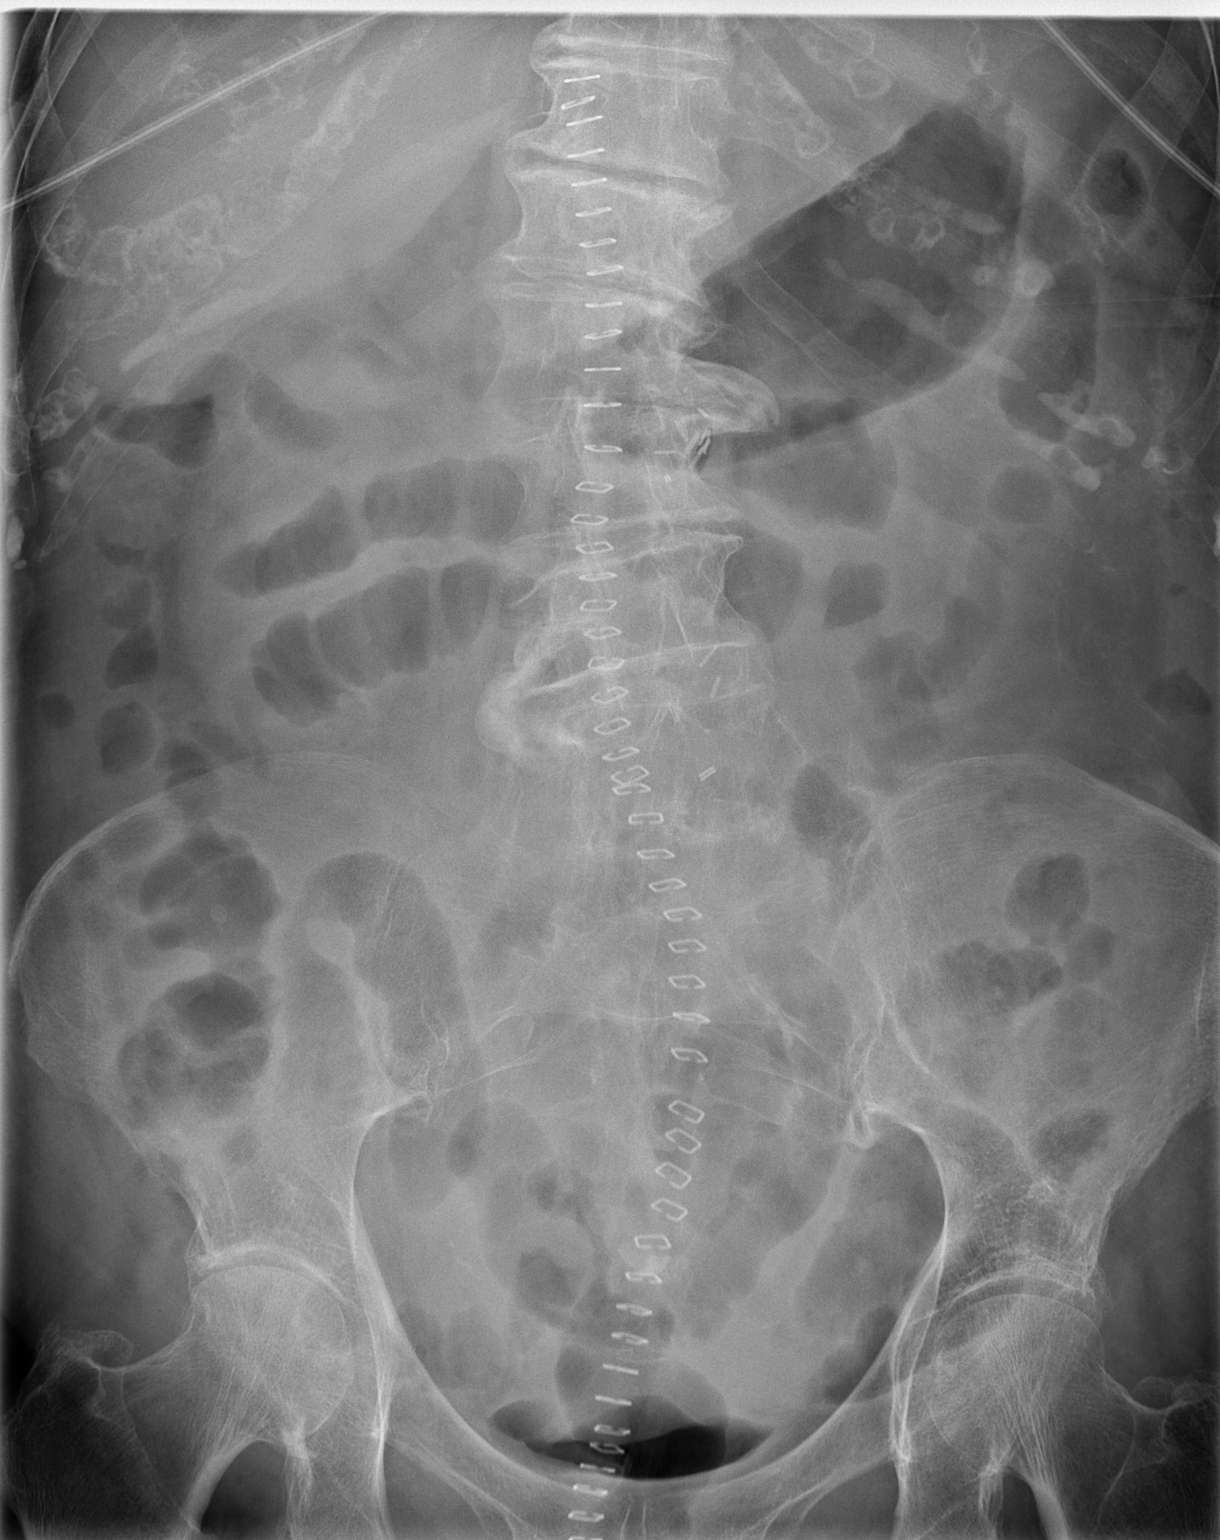

[x abdomen decub]
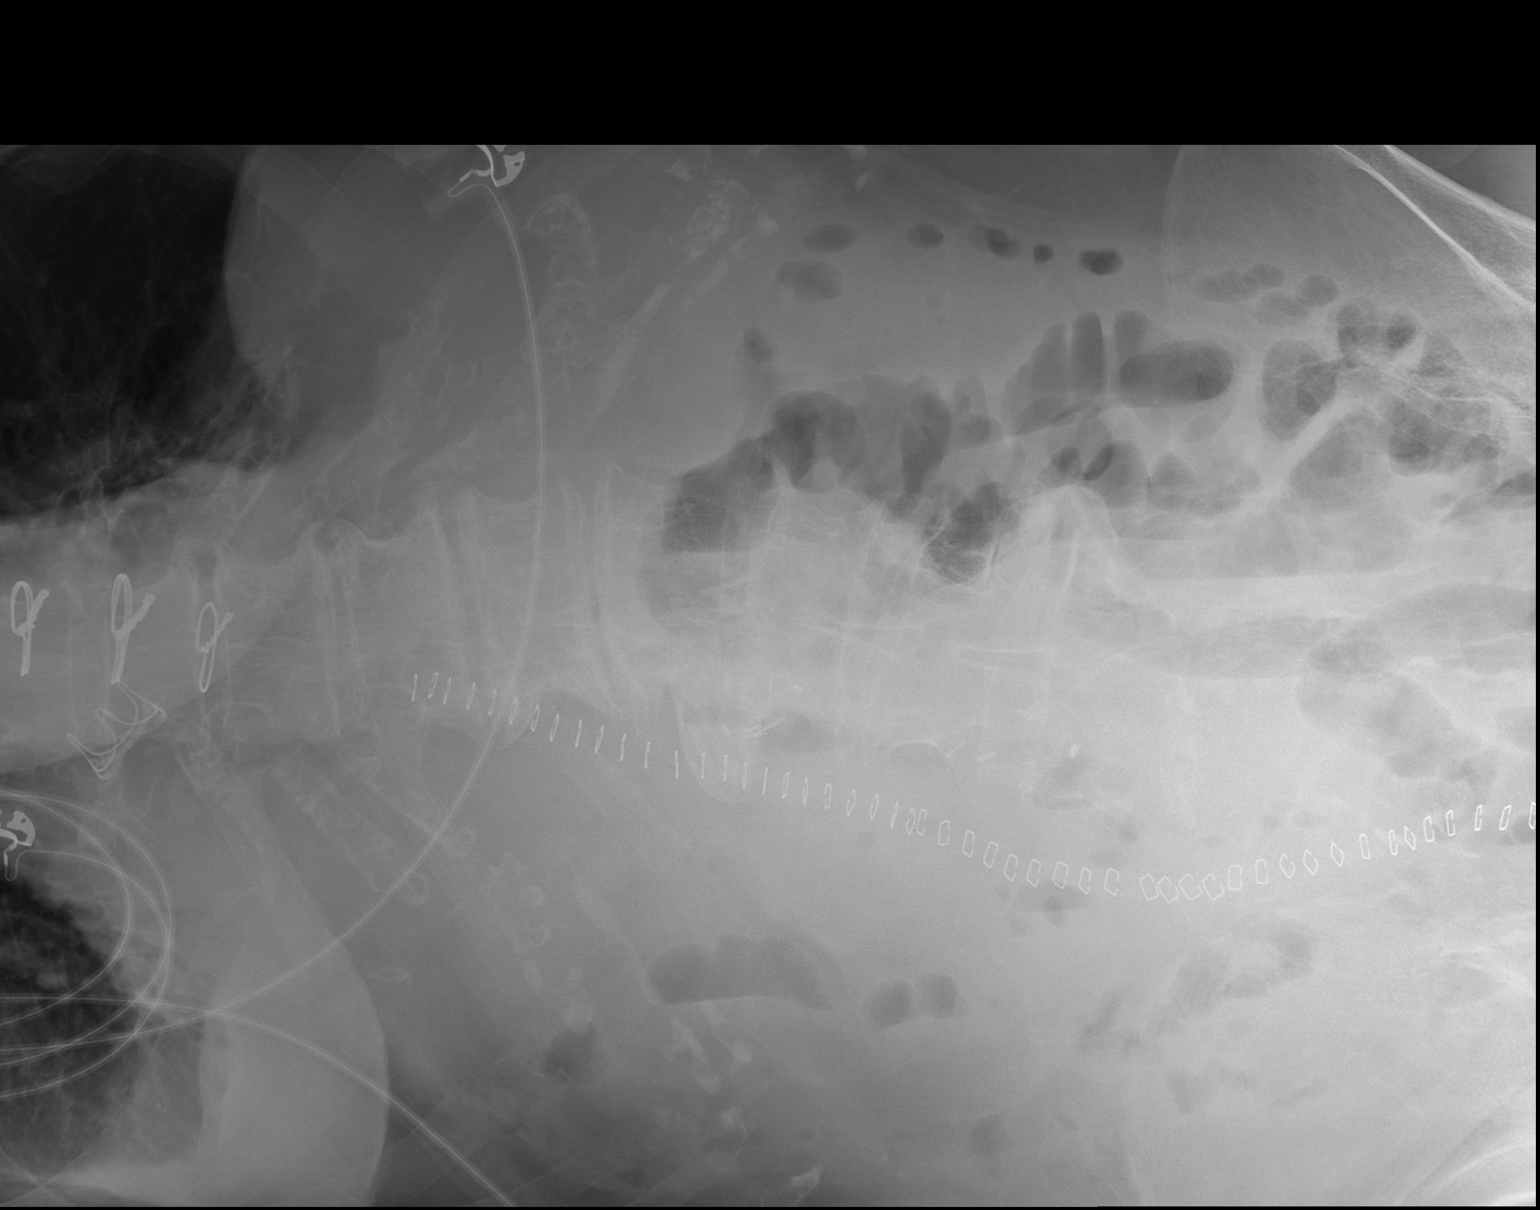

[2 of 2 positions shown; findings below may reference images not displayed]

FINDINGS: Midline laparotomy staples remain.  Gas within normal
caliber colon and upper normal caliber small bowel.  Overall
improved since the prior. No pneumatosis or free intraperitoneal
air.  Distal gas and stool.  Vascular calcifications.  Convex right
lumbar spine curvature with advanced spondylosis.
IMPRESSION: Gas within large and small bowel loops.  Most consistent with
improving postoperative adynamic ileus.  No free intraperitoneal
air or other acute complication.

## 2013-06-03 NOTE — Progress Notes (Signed)
This encounter was created in error - please disregard.

## 2013-11-03 ENCOUNTER — Encounter: Payer: Self-pay | Admitting: Cardiovascular Disease

## 2013-11-03 ENCOUNTER — Ambulatory Visit (INDEPENDENT_AMBULATORY_CARE_PROVIDER_SITE_OTHER): Payer: Medicare Other | Admitting: Cardiovascular Disease

## 2013-11-03 VITALS — BP 116/69 | HR 61 | Ht 68.0 in | Wt 142.0 lb

## 2013-11-03 DIAGNOSIS — Z9889 Other specified postprocedural states: Secondary | ICD-10-CM

## 2013-11-03 DIAGNOSIS — Z954 Presence of other heart-valve replacement: Secondary | ICD-10-CM

## 2013-11-03 DIAGNOSIS — Z952 Presence of prosthetic heart valve: Secondary | ICD-10-CM

## 2013-11-03 DIAGNOSIS — Z8679 Personal history of other diseases of the circulatory system: Secondary | ICD-10-CM

## 2013-11-03 DIAGNOSIS — I959 Hypotension, unspecified: Secondary | ICD-10-CM

## 2013-11-03 DIAGNOSIS — E785 Hyperlipidemia, unspecified: Secondary | ICD-10-CM

## 2013-11-03 DIAGNOSIS — I10 Essential (primary) hypertension: Secondary | ICD-10-CM

## 2013-11-03 NOTE — Progress Notes (Signed)
Patient ID: Lauren Morrow, female   DOB: [birth year], 80 y.o.   MRN: [id number]      SUBJECTIVE: The patient is an 80 yr old female with an abdominal aortic aneurysm s/p repair in 11/2011, severe aortic stenosis s/p bioprosthetic AVR in 11/2010, HTN, and hyperlipidemia. She has chronic dyspnea which has not gotten any worse. She denies chest pain. She feels well overall. ECG performed in the office today demonstrates normal sinus rhythm, heart rate 61 bpm, with a nonspecific ST segment abnormality.    Review of Systems: As per "subjective", otherwise negative.  Allergies  Allergen Reactions  . Avelox [Moxifloxacin Hcl In Nacl] Rash    Avelox began at approximately 0800. At 0807 pt noted to have red-streaking proximal to the PIV site, up the left arm, to the neck/chest area. Avelox immediately discontinued. Pt received about 1/4 the dose or 100mg . Vital signs remained stable. The anesthesiologist and surgeon were notified and shown the site of red, rashy streaking.  Marland Kitchen Penicillins Swelling  . Sulfa Antibiotics Other (See Comments)    unknown    Current Outpatient Prescriptions  Medication Sig Dispense Refill  . aspirin EC 325 MG tablet Take 1 tablet (325 mg total) by mouth daily.  30 tablet  0  . Biotin 5000 MCG TABS Take 5,000 mcg by mouth daily.      . calcium-vitamin D (OSCAL WITH D) 500-200 MG-UNIT per tablet Take 1 tablet by mouth 2 (two) times daily.        . Cholecalciferol (VITAMIN D) 1000 UNITS capsule Take 1,000 Units by mouth daily.      . fish oil-omega-3 fatty acids 1000 MG capsule Take 1 g by mouth daily.        . Garlic 7096 MG CAPS Take 1,000 mg by mouth daily.      . metoprolol tartrate (LOPRESSOR) 25 MG tablet Take 1 tablet (25 mg total) by mouth 2 (two) times daily.  60 tablet  11  . Multiple Vitamin (MULTIVITAMIN) tablet Take 1 tablet by mouth daily.        Marland Kitchen omeprazole (PRILOSEC) 20 MG capsule Take 20 mg by mouth daily.      . raloxifene (EVISTA) 60 MG tablet  Take 60 mg by mouth daily.       . sertraline (ZOLOFT) 50 MG tablet Take 50 mg by mouth daily.        No current facility-administered medications for this visit.    Past Medical History  Diagnosis Date  . Varicose veins   . Seasonal allergies   . Osteomyelitis     history of osteomyelistis in the leg  . Aortic stenosis     Dr. Rod Can, saw last 09/11/11  . Dizziness   . Peripheral vascular disease   . Hypertension     sees Dr. Woody Seller, primary  . Atrial fibrillation     Post op, sees  Dr. Fletcher Anon  . Anxiety   . Shortness of breath     "gets short of breath at time since the heart surgery 2012"  . Urinary tract infection     hx of  . GERD (gastroesophageal reflux disease)   . Arthritis   . Anemia     hx of  . Low back pain   . Cataracts, bilateral     Past Surgical History  Procedure Laterality Date  . Spine surgery  1967  1976    dr. Maryjean Ka  . Aortic valve replacement  11/22/2010    71mm  pericardial valve serial#[id number], model [id number]  . Cardiac catheterization    . Aortic valve replacement      In 2012 with a bioprosthetic valve  . Bladder tact      1960's  . Abdominal aortic aneurysm repair  11/21/2011    Procedure: ANEURYSM ABDOMINAL AORTIC REPAIR;  Surgeon: Mal Misty, MD;  Location: [facility] OR;  Service: Vascular;  Laterality: N/A;  using 16 x 8 mm x 40 cm hemashield graft  . Patch angioplasty  11/21/2011    Procedure: PATCH ANGIOPLASTY;  Surgeon: Mal Misty, MD;  Location: [facility];  Service: Vascular;  Laterality: Right;  Iliac vein  . Laparotomy  11/21/2011    Procedure: EXPLORATORY LAPAROTOMY;  Surgeon: Mal Misty, MD;  Location: [facility];  Service: Vascular;  Laterality: N/A;  exploratory laparotomy, evacuation of hematoma, suture ligation of lumbar arterial branch   . Cataract extraction w/phaco Right 12/09/2012    Procedure: CATARACT EXTRACTION PHACO AND INTRAOCULAR LENS PLACEMENT (IOC);  Surgeon: Tonny Branch, MD;  Location: [facility];  Service: Ophthalmology;   Laterality: Right;  CDE 19.32  . Cataract extraction w/phaco Left 12/27/2012    Procedure: LEFT EYE CATARACT EXTRACTION PHACO AND INTRAOCULAR LENS PLACEMENT ;  Surgeon: Tonny Branch, MD;  Location: [facility];  Service: Ophthalmology;  Laterality: Left;  CDE 10.78    History   Social History  . Marital Status: Widowed    Spouse Name: N/A    Number of Children: N/A  . Years of Education: N/A   Occupational History  . Not on file.   Social History Main Topics  . Smoking status: Never Smoker   . Smokeless tobacco: Never Used  . Alcohol Use: No  . Drug Use: No  . Sexual Activity: Yes    Birth Control/ Protection: None   Other Topics Concern  . Not on file   Social History Narrative  . No narrative on file     Filed Vitals:   11/03/13 1250  Height: 5\' 8"  (1.727 m)  Weight: 142 lb (64.411 kg)   BP 116/69 Pulse 61   PHYSICAL EXAM General: NAD HEENT: Normal. Neck: No JVD, no thyromegaly. Lungs: Clear to auscultation bilaterally with normal respiratory effort. CV: Nondisplaced PMI.  Regular rate and rhythm, normal S1/prosthetic S2, no S3/S4, II/VI ejection systolic murmur over RUSB. No pretibial or periankle edema.  No carotid bruit.  Normal pedal pulses.  Abdomen: Soft, nontender, no hepatosplenomegaly, no distention.  Neurologic: Alert and oriented x 3.  Psych: Normal affect. Skin: Normal. Musculoskeletal: Normal range of motion, no gross deformities. Extremities: No clubbing or cyanosis.   ECG: Most recent ECG reviewed.      ASSESSMENT AND PLAN: 1. Severe aortic stenosis s/p bioprosthetic AVR 11/2010: Symptomatically stable. No indication for noninvasive testing at this time. 2. Abdominal aortic aneurysm s/p repair 11/2011: Stable and previously followed by vascular surgery. No indication for imaging today. 3. Essential HTN: Well controlled on current therapy. No changes. 4. Hyperlipidemia: On fish oil and garlic.   Dispo: f/u 1 year with Dr. Harl Bowie.  Kate Sable, M.D., F.A.C.C.

## 2013-11-03 NOTE — Patient Instructions (Signed)
Continue all current medications. Your physician wants you to follow up in:  1 year.  You will receive a reminder letter in the mail one-two months in advance.  If you don't receive a letter, please call our office to schedule the follow up appointment   

## 2014-01-18 ENCOUNTER — Other Ambulatory Visit: Payer: Self-pay | Admitting: Cardiology

## 2014-11-03 ENCOUNTER — Encounter: Payer: Self-pay | Admitting: Cardiology

## 2014-11-03 ENCOUNTER — Ambulatory Visit (INDEPENDENT_AMBULATORY_CARE_PROVIDER_SITE_OTHER): Payer: Medicare Other | Admitting: Cardiology

## 2014-11-03 VITALS — BP 124/76 | HR 65 | Ht 68.0 in | Wt 150.0 lb

## 2014-11-03 DIAGNOSIS — I10 Essential (primary) hypertension: Secondary | ICD-10-CM | POA: Diagnosis not present

## 2014-11-03 DIAGNOSIS — Z954 Presence of other heart-valve replacement: Secondary | ICD-10-CM | POA: Diagnosis not present

## 2014-11-03 DIAGNOSIS — Z952 Presence of prosthetic heart valve: Secondary | ICD-10-CM

## 2014-11-03 MED ORDER — METOPROLOL TARTRATE 25 MG PO TABS: 25.0000 mg | ORAL_TABLET | Freq: Two times a day (BID) | ORAL | Status: DC

## 2014-11-03 NOTE — Patient Instructions (Signed)
Continue all current medications. Your physician wants you to follow up in:  1 year.  You will receive a reminder letter in the mail one-two months in advance.  If you don't receive a letter, please call our office to schedule the follow up appointment   

## 2014-11-03 NOTE — Progress Notes (Signed)
Patient ID: Lauren Morrow, female   DOB: 02-12-1924, 81 y.o.   MRN: [id number]     Clinical Summary Ms. Zamorano is a 81 y.o.female seen today for follow up of the following medical problems.   1. Abdominal aortic aneurysm  - prior repair, she is followed by vascular -  denies any abdominal pain  2. Severe aortic stenosis  - s/p AVR with a bioprosthetic valve Nov 2012 - denies chest pain. Stable DOE.   3. HTN - does not check at home - compliant with meds  4. HL - followed by PCP, no recent panel on our system Past Medical History  Diagnosis Date  . Varicose veins   . Seasonal allergies   . Osteomyelitis     history of osteomyelistis in the leg  . Aortic stenosis     Dr. Rod Can, saw last 09/11/11  . Dizziness   . Peripheral vascular disease   . Hypertension     sees Dr. Woody Seller, primary  . Atrial fibrillation     Post op, sees  Dr. Fletcher Anon  . Anxiety   . Shortness of breath     "gets short of breath at time since the heart surgery 2012"  . Urinary tract infection     hx of  . GERD (gastroesophageal reflux disease)   . Arthritis   . Anemia     hx of  . Low back pain   . Cataracts, bilateral      Allergies  Allergen Reactions  . Avelox [Moxifloxacin Hcl In Nacl] Rash    Avelox began at approximately 0800. At 0807 pt noted to have red-streaking proximal to the PIV site, up the left arm, to the neck/chest area. Avelox immediately discontinued. Pt received about 1/4 the dose or 100mg . Vital signs remained stable. The anesthesiologist and surgeon were notified and shown the site of red, rashy streaking.  Marland Kitchen Penicillins Swelling  . Sulfa Antibiotics Other (See Comments)    unknown     Current Outpatient Prescriptions  Medication Sig Dispense Refill  . aspirin EC 325 MG tablet Take 1 tablet (325 mg total) by mouth daily. 30 tablet 0  . Biotin 5000 MCG TABS Take 5,000 mcg by mouth daily.    . calcium-vitamin D (OSCAL WITH D) 500-200 MG-UNIT per tablet Take 1  tablet by mouth 2 (two) times daily.      . Cholecalciferol (VITAMIN D) 1000 UNITS capsule Take 1,000 Units by mouth daily.    . fish oil-omega-3 fatty acids 1000 MG capsule Take 1 g by mouth daily.      . Garlic 5284 MG CAPS Take 1,000 mg by mouth daily.    . metoprolol tartrate (LOPRESSOR) 25 MG tablet TAKE 1 TABLET BY MOUTH TWICE DAILY 60 tablet 11  . Multiple Vitamin (MULTIVITAMIN) tablet Take 1 tablet by mouth daily.      Marland Kitchen omeprazole (PRILOSEC) 20 MG capsule Take 20 mg by mouth daily.    . raloxifene (EVISTA) 60 MG tablet Take 60 mg by mouth daily.     . sertraline (ZOLOFT) 50 MG tablet Take 50 mg by mouth daily.      No current facility-administered medications for this visit.     Past Surgical History  Procedure Laterality Date  . Spine surgery  1967  1976    dr. Maryjean Ka  . Aortic valve replacement  11/22/2010    16mm pericardial valve serial#[id number], model 3300tfx  . Cardiac catheterization    . Aortic valve replacement  In 2012 with a bioprosthetic valve  . Bladder tact      1960's  . Abdominal aortic aneurysm repair  11/21/2011    Procedure: ANEURYSM ABDOMINAL AORTIC REPAIR;  Surgeon: Mal Misty, MD;  Location: [facility] OR;  Service: Vascular;  Laterality: N/A;  using 16 x 8 mm x 40 cm hemashield graft  . Patch angioplasty  11/21/2011    Procedure: PATCH ANGIOPLASTY;  Surgeon: Mal Misty, MD;  Location: [place];  Service: Vascular;  Laterality: Right;  Iliac vein  . Laparotomy  11/21/2011    Procedure: EXPLORATORY LAPAROTOMY;  Surgeon: Mal Misty, MD;  Location: [name];  Service: Vascular;  Laterality: N/A;  exploratory laparotomy, evacuation of hematoma, suture ligation of lumbar arterial Khiyan Crace   . Cataract extraction w/phaco Right 12/09/2012    Procedure: CATARACT EXTRACTION PHACO AND INTRAOCULAR LENS PLACEMENT (IOC);  Surgeon: Tonny Advait Buice, MD;  Location: [facility];  Service: Ophthalmology;  Laterality: Right;  CDE 19.32  . Cataract extraction w/phaco Left 12/27/2012     Procedure: LEFT EYE CATARACT EXTRACTION PHACO AND INTRAOCULAR LENS PLACEMENT ;  Surgeon: Tonny Marvell Stavola, MD;  Location: [facility];  Service: Ophthalmology;  Laterality: Left;  CDE 10.78     Allergies  Allergen Reactions  . Avelox [Moxifloxacin Hcl In Nacl] Rash    Avelox began at approximately 0800. At 0807 pt noted to have red-streaking proximal to the PIV site, up the left arm, to the neck/chest area. Avelox immediately discontinued. Pt received about 1/4 the dose or 100mg . Vital signs remained stable. The anesthesiologist and surgeon were notified and shown the site of red, rashy streaking.  Marland Kitchen Penicillins Swelling  . Sulfa Antibiotics Other (See Comments)    unknown      Family History  Problem Relation Age of Onset  . Other Mother     Varicose Veins  . Cancer Sister   . Hyperlipidemia Brother   . Other Brother      Social History Ms. Bergevin reports that she has never smoked. She has never used smokeless tobacco. Ms. Serratore reports that she does not drink alcohol.   Review of Systems CONSTITUTIONAL: No weight loss, fever, chills, weakness or fatigue.  HEENT: Eyes: No visual loss, blurred vision, double vision or yellow sclerae.No hearing loss, sneezing, congestion, runny nose or sore throat.  SKIN: No rash or itching.  CARDIOVASCULAR: per hpi RESPIRATORY: No shortness of breath, cough or sputum.  GASTROINTESTINAL: No anorexia, nausea, vomiting or diarrhea. No abdominal pain or blood.  GENITOURINARY: No burning on urination, no polyuria NEUROLOGICAL: No headache, dizziness, syncope, paralysis, ataxia, numbness or tingling in the extremities. No change in bowel or bladder control.  MUSCULOSKELETAL: No muscle, back pain, joint pain or stiffness.  LYMPHATICS: No enlarged nodes. No history of splenectomy.  PSYCHIATRIC: No history of depression or anxiety.  ENDOCRINOLOGIC: No reports of sweating, cold or heat intolerance. No polyuria or polydipsia.  Marland Kitchen   Physical  Examination Filed Vitals:   11/03/14 1422  BP: 124/76  Pulse: 65   Filed Vitals:   11/03/14 1422  Height: 5\' 8"  (1.727 m)  Weight: 150 lb (68.04 kg)    Gen: resting comfortably, no acute distress HEENT: no scleral icterus, pupils equal round and reactive, no palptable cervical adenopathy,  CV: RRR, no m/r/g, no jvd Resp: Clear to auscultation bilaterally GI: abdomen is soft, non-tender, non-distended, normal bowel sounds, no hepatosplenomegaly MSK: extremities are warm, no edema.  Skin: warm, no rash Neuro:  no focal deficits Psych: appropriate  affect   Diagnostic Studies 11/2011 Echo: LVEF 91-50%, grade I diastolic dysfunction, normal AVR, mild MR, mild TR    Assessment and Plan  1. Aortic stenosis s/p valve replacement - no current symptoms - continue to follow clinically  2. Abdominal aortic aneurysm repair -  continue to follow up with vascular  3. HTN - at goal, continue current meds  4. HL - managed by pcp. Will request labs from pcp  F/u 1 year      Arnoldo Lenis, M.D.

## 2014-11-24 ENCOUNTER — Encounter: Payer: Self-pay | Admitting: *Deleted

## 2015-07-03 DIAGNOSIS — Z1211 Encounter for screening for malignant neoplasm of colon: Secondary | ICD-10-CM | POA: Diagnosis not present

## 2015-07-03 DIAGNOSIS — R5383 Other fatigue: Secondary | ICD-10-CM | POA: Diagnosis not present

## 2015-07-03 DIAGNOSIS — Z1389 Encounter for screening for other disorder: Secondary | ICD-10-CM | POA: Diagnosis not present

## 2015-07-03 DIAGNOSIS — Z7189 Other specified counseling: Secondary | ICD-10-CM | POA: Diagnosis not present

## 2015-07-03 DIAGNOSIS — Z0000 Encounter for general adult medical examination without abnormal findings: Secondary | ICD-10-CM | POA: Diagnosis not present

## 2015-07-03 DIAGNOSIS — Z79899 Other long term (current) drug therapy: Secondary | ICD-10-CM | POA: Diagnosis not present

## 2015-07-03 DIAGNOSIS — Z6822 Body mass index (BMI) 22.0-22.9, adult: Secondary | ICD-10-CM | POA: Diagnosis not present

## 2015-07-03 DIAGNOSIS — Z299 Encounter for prophylactic measures, unspecified: Secondary | ICD-10-CM | POA: Diagnosis not present

## 2015-08-14 DIAGNOSIS — I4891 Unspecified atrial fibrillation: Secondary | ICD-10-CM | POA: Diagnosis not present

## 2015-08-14 DIAGNOSIS — I10 Essential (primary) hypertension: Secondary | ICD-10-CM | POA: Diagnosis not present

## 2015-08-14 DIAGNOSIS — M810 Age-related osteoporosis without current pathological fracture: Secondary | ICD-10-CM | POA: Diagnosis not present

## 2015-09-17 DIAGNOSIS — M810 Age-related osteoporosis without current pathological fracture: Secondary | ICD-10-CM | POA: Diagnosis not present

## 2015-09-17 DIAGNOSIS — I4891 Unspecified atrial fibrillation: Secondary | ICD-10-CM | POA: Diagnosis not present

## 2015-09-17 DIAGNOSIS — I10 Essential (primary) hypertension: Secondary | ICD-10-CM | POA: Diagnosis not present

## 2015-10-29 DIAGNOSIS — I4891 Unspecified atrial fibrillation: Secondary | ICD-10-CM | POA: Diagnosis not present

## 2015-10-29 DIAGNOSIS — I10 Essential (primary) hypertension: Secondary | ICD-10-CM | POA: Diagnosis not present

## 2015-10-29 DIAGNOSIS — M810 Age-related osteoporosis without current pathological fracture: Secondary | ICD-10-CM | POA: Diagnosis not present

## 2015-11-06 ENCOUNTER — Other Ambulatory Visit: Payer: Self-pay | Admitting: Cardiology

## 2015-11-14 DIAGNOSIS — H40013 Open angle with borderline findings, low risk, bilateral: Secondary | ICD-10-CM | POA: Diagnosis not present

## 2015-11-14 DIAGNOSIS — H524 Presbyopia: Secondary | ICD-10-CM | POA: Diagnosis not present

## 2015-11-14 DIAGNOSIS — H353131 Nonexudative age-related macular degeneration, bilateral, early dry stage: Secondary | ICD-10-CM | POA: Diagnosis not present

## 2015-11-14 DIAGNOSIS — Z961 Presence of intraocular lens: Secondary | ICD-10-CM | POA: Diagnosis not present

## 2015-12-06 ENCOUNTER — Other Ambulatory Visit: Payer: Self-pay | Admitting: Cardiology

## 2015-12-06 DIAGNOSIS — I4891 Unspecified atrial fibrillation: Secondary | ICD-10-CM | POA: Diagnosis not present

## 2015-12-06 DIAGNOSIS — I10 Essential (primary) hypertension: Secondary | ICD-10-CM | POA: Diagnosis not present

## 2015-12-06 DIAGNOSIS — M810 Age-related osteoporosis without current pathological fracture: Secondary | ICD-10-CM | POA: Diagnosis not present

## 2015-12-13 DIAGNOSIS — Z1231 Encounter for screening mammogram for malignant neoplasm of breast: Secondary | ICD-10-CM | POA: Diagnosis not present

## 2015-12-26 DIAGNOSIS — M810 Age-related osteoporosis without current pathological fracture: Secondary | ICD-10-CM | POA: Diagnosis not present

## 2015-12-26 DIAGNOSIS — I10 Essential (primary) hypertension: Secondary | ICD-10-CM | POA: Diagnosis not present

## 2015-12-26 DIAGNOSIS — I4891 Unspecified atrial fibrillation: Secondary | ICD-10-CM | POA: Diagnosis not present

## 2016-01-14 ENCOUNTER — Encounter: Payer: Self-pay | Admitting: *Deleted

## 2016-01-14 ENCOUNTER — Encounter: Payer: Self-pay | Admitting: Cardiology

## 2016-01-14 ENCOUNTER — Ambulatory Visit (INDEPENDENT_AMBULATORY_CARE_PROVIDER_SITE_OTHER): Payer: Medicare Other | Admitting: Cardiology

## 2016-01-14 VITALS — BP 159/79 | HR 59 | Ht 68.0 in | Wt 151.4 lb

## 2016-01-14 DIAGNOSIS — Z952 Presence of prosthetic heart valve: Secondary | ICD-10-CM | POA: Diagnosis not present

## 2016-01-14 DIAGNOSIS — I10 Essential (primary) hypertension: Secondary | ICD-10-CM

## 2016-01-14 NOTE — Progress Notes (Signed)
Clinical Summary Ms. Hiott is a 83 y.o.female seen today for follow up of the following medical problems.   1. Abdominal aortic aneurysm  - prior repair, she reports signed off by vascuar.  -no recent abdominal pain  2. Severe aortic stenosis  - s/p AVR with a bioprosthetic valve Nov 2012   - no recent SOB or DOE. No chest pain or syncope.    3. HTN - does not check regularly at home - she is compliant with meds     Past Medical History:  Diagnosis Date  . Anemia    hx of  . Anxiety   . Aortic stenosis    Dr. Rod Can, saw last 09/11/11  . Arthritis   . Atrial fibrillation ([place])    Post op, sees  Dr. Fletcher Anon  . Cataracts, bilateral   . Dizziness   . GERD (gastroesophageal reflux disease)   . Hypertension    sees Dr. Woody Seller, primary  . Low back pain   . Osteomyelitis ([place])    history of osteomyelistis in the leg  . Peripheral vascular disease ([place])   . Seasonal allergies   . Shortness of breath    "gets short of breath at time since the heart surgery 2012"  . Urinary tract infection    hx of  . Varicose veins      Allergies  Allergen Reactions  . Avelox [Moxifloxacin Hcl In Nacl] Rash    Avelox began at approximately 0800. At 0807 pt noted to have red-streaking proximal to the PIV site, up the left arm, to the neck/chest area. Avelox immediately discontinued. Pt received about 1/4 the dose or 100mg . Vital signs remained stable. The anesthesiologist and surgeon were notified and shown the site of red, rashy streaking.  Marland Kitchen Penicillins Swelling  . Sulfa Antibiotics Other (See Comments)    unknown     Current Outpatient Prescriptions  Medication Sig Dispense Refill  . aspirin EC 325 MG tablet Take 1 tablet (325 mg total) by mouth daily. 30 tablet 0  . Biotin 5000 MCG TABS Take 5,000 mcg by mouth daily.    . calcium-vitamin D (OSCAL WITH D) 500-200 MG-UNIT per tablet Take 1 tablet by mouth 2 (two) times daily.      . Cholecalciferol (VITAMIN D) 1000  UNITS capsule Take 1,000 Units by mouth daily.    . fish oil-omega-3 fatty acids 1000 MG capsule Take 1 g by mouth daily.      . Garlic 123XX123 MG CAPS Take 1,000 mg by mouth daily.    . metoprolol tartrate (LOPRESSOR) 25 MG tablet TAKE 1 TABLET BY MOUTH TWICE DAILY 60 tablet 3  . Multiple Vitamin (MULTIVITAMIN) tablet Take 1 tablet by mouth daily.      Marland Kitchen omeprazole (PRILOSEC) 20 MG capsule Take 20 mg by mouth daily.    . raloxifene (EVISTA) 60 MG tablet Take 60 mg by mouth daily.     . sertraline (ZOLOFT) 50 MG tablet Take 50 mg by mouth daily.      No current facility-administered medications for this visit.      Past Surgical History:  Procedure Laterality Date  . ABDOMINAL AORTIC ANEURYSM REPAIR  11/21/2011   Procedure: ANEURYSM ABDOMINAL AORTIC REPAIR;  Surgeon: Mal Misty, MD;  Location: [facility] OR;  Service: Vascular;  Laterality: N/A;  using 16 x 8 mm x 40 cm hemashield graft  . AORTIC VALVE REPLACEMENT  11/22/2010   51mm pericardial valve serial#[id number], model 3300tfx  .  AORTIC VALVE REPLACEMENT     In 2012 with a bioprosthetic valve  . bladder tact     1960's  . CARDIAC CATHETERIZATION    . CATARACT EXTRACTION W/PHACO Right 12/09/2012   Procedure: CATARACT EXTRACTION PHACO AND INTRAOCULAR LENS PLACEMENT ([place]);  Surgeon: Tonny Branch, MD;  Location: [facility];  Service: Ophthalmology;  Laterality: Right;  CDE 19.32  . CATARACT EXTRACTION W/PHACO Left 12/27/2012   Procedure: LEFT EYE CATARACT EXTRACTION PHACO AND INTRAOCULAR LENS PLACEMENT ;  Surgeon: Tonny Branch, MD;  Location: [facility];  Service: Ophthalmology;  Laterality: Left;  CDE 10.78  . LAPAROTOMY  11/21/2011   Procedure: EXPLORATORY LAPAROTOMY;  Surgeon: Mal Misty, MD;  Location: [place];  Service: Vascular;  Laterality: N/A;  exploratory laparotomy, evacuation of hematoma, suture ligation of lumbar arterial branch   . PATCH ANGIOPLASTY  11/21/2011   Procedure: PATCH ANGIOPLASTY;  Surgeon: Mal Misty, MD;  Location: [place];   Service: Vascular;  Laterality: Right;  Iliac vein  . SPINE SURGERY  1967  1976   dr. Maryjean Ka     Allergies  Allergen Reactions  . Avelox [Moxifloxacin Hcl In Nacl] Rash    Avelox began at approximately 0800. At 0807 pt noted to have red-streaking proximal to the PIV site, up the left arm, to the neck/chest area. Avelox immediately discontinued. Pt received about 1/4 the dose or 100mg . Vital signs remained stable. The anesthesiologist and surgeon were notified and shown the site of red, rashy streaking.  Marland Kitchen Penicillins Swelling  . Sulfa Antibiotics Other (See Comments)    unknown      Family History  Problem Relation Age of Onset  . Other Mother     Varicose Veins  . Cancer Sister   . Hyperlipidemia Brother   . Other Brother      Social History Ms. Smestad reports that she has never smoked. She has never used smokeless tobacco. Ms. Moline reports that she does not drink alcohol.   Review of Systems CONSTITUTIONAL: No weight loss, fever, chills, weakness or fatigue.  HEENT: Eyes: No visual loss, blurred vision, double vision or yellow sclerae.No hearing loss, sneezing, congestion, runny nose or sore throat.  SKIN: No rash or itching.  CARDIOVASCULAR: per HPI RESPIRATORY: No shortness of breath, cough or sputum.  GASTROINTESTINAL: No anorexia, nausea, vomiting or diarrhea. No abdominal pain or blood.  GENITOURINARY: No burning on urination, no polyuria NEUROLOGICAL: No headache, dizziness, syncope, paralysis, ataxia, numbness or tingling in the extremities. No change in bowel or bladder control.  MUSCULOSKELETAL: No muscle, back pain, joint pain or stiffness.  LYMPHATICS: No enlarged nodes. No history of splenectomy.  PSYCHIATRIC: No history of depression or anxiety.  ENDOCRINOLOGIC: No reports of sweating, cold or heat intolerance. No polyuria or polydipsia.  Marland Kitchen   Physical Examination Vitals:   01/14/16 1404  BP: (!) 159/79  Pulse: (!) 59   Vitals:   01/14/16  1404  Weight: 151 lb 6.4 oz (68.7 kg)  Height: 5\' 8"  (1.727 m)    Gen: resting comfortably, no acute distress HEENT: no scleral icterus, pupils equal round and reactive, no palptable cervical adenopathy,  CV: RRR, no m/r/g, no jvd Resp: Clear to auscultation bilaterally GI: abdomen is soft, non-tender, non-distended, normal bowel sounds, no hepatosplenomegaly MSK: extremities are warm, no edema.  Skin: warm, no rash Neuro:  no focal deficits Psych: appropriate affect   Diagnostic Studies 11/2011 Echo: LVEF Q000111Q, grade I diastolic dysfunction, normal AVR, mild MR, mild TR  Assessment and Plan  1. Aortic stenosis s/p valve replacement - no current symptoms - we will continue to monitor - change ASA to 81mg  daily  2. Abdominal aortic aneurysm repair -  no recent symptoms, continue to monitor  3. HTN - initial bp elevated, f/u manual bp at 130/70 and at goal  - continue current meds  EKG in clnic shows SR, no acute ischemic changes   F/u 1 year      Arnoldo Lenis, M.D.

## 2016-01-14 NOTE — Patient Instructions (Signed)
Medication Instructions:   Decrease Aspirin to 81mg  daily.  Continue all other medications.    Labwork: none  Testing/Procedures: none  Follow-Up: Your physician wants you to follow up in:  1 year.  You will receive a reminder letter in the mail one-two months in advance.  If you don't receive a letter, please call our office to schedule the follow up appointment   Any Other Special Instructions Will Be Listed Below (If Applicable).  If you need a refill on your cardiac medications before your next appointment, please call your pharmacy.

## 2016-02-12 DIAGNOSIS — M810 Age-related osteoporosis without current pathological fracture: Secondary | ICD-10-CM | POA: Diagnosis not present

## 2016-02-12 DIAGNOSIS — I4891 Unspecified atrial fibrillation: Secondary | ICD-10-CM | POA: Diagnosis not present

## 2016-02-12 DIAGNOSIS — I10 Essential (primary) hypertension: Secondary | ICD-10-CM | POA: Diagnosis not present

## 2016-02-27 DIAGNOSIS — H40013 Open angle with borderline findings, low risk, bilateral: Secondary | ICD-10-CM | POA: Diagnosis not present

## 2016-03-17 DIAGNOSIS — M810 Age-related osteoporosis without current pathological fracture: Secondary | ICD-10-CM | POA: Diagnosis not present

## 2016-03-17 DIAGNOSIS — I10 Essential (primary) hypertension: Secondary | ICD-10-CM | POA: Diagnosis not present

## 2016-03-17 DIAGNOSIS — I4891 Unspecified atrial fibrillation: Secondary | ICD-10-CM | POA: Diagnosis not present

## 2016-04-04 ENCOUNTER — Other Ambulatory Visit: Payer: Self-pay | Admitting: Cardiology

## 2016-04-17 DIAGNOSIS — I10 Essential (primary) hypertension: Secondary | ICD-10-CM | POA: Diagnosis not present

## 2016-04-17 DIAGNOSIS — I4891 Unspecified atrial fibrillation: Secondary | ICD-10-CM | POA: Diagnosis not present

## 2016-04-17 DIAGNOSIS — M810 Age-related osteoporosis without current pathological fracture: Secondary | ICD-10-CM | POA: Diagnosis not present

## 2016-05-19 DIAGNOSIS — I4891 Unspecified atrial fibrillation: Secondary | ICD-10-CM | POA: Diagnosis not present

## 2016-05-19 DIAGNOSIS — I10 Essential (primary) hypertension: Secondary | ICD-10-CM | POA: Diagnosis not present

## 2016-05-19 DIAGNOSIS — M810 Age-related osteoporosis without current pathological fracture: Secondary | ICD-10-CM | POA: Diagnosis not present

## 2016-06-05 DIAGNOSIS — H353131 Nonexudative age-related macular degeneration, bilateral, early dry stage: Secondary | ICD-10-CM | POA: Diagnosis not present

## 2016-06-17 DIAGNOSIS — I10 Essential (primary) hypertension: Secondary | ICD-10-CM | POA: Diagnosis not present

## 2016-06-17 DIAGNOSIS — M159 Polyosteoarthritis, unspecified: Secondary | ICD-10-CM | POA: Diagnosis not present

## 2016-06-17 DIAGNOSIS — M810 Age-related osteoporosis without current pathological fracture: Secondary | ICD-10-CM | POA: Diagnosis not present

## 2016-06-17 DIAGNOSIS — I4891 Unspecified atrial fibrillation: Secondary | ICD-10-CM | POA: Diagnosis not present

## 2016-07-15 DIAGNOSIS — I719 Aortic aneurysm of unspecified site, without rupture: Secondary | ICD-10-CM | POA: Diagnosis not present

## 2016-07-15 DIAGNOSIS — I4891 Unspecified atrial fibrillation: Secondary | ICD-10-CM | POA: Diagnosis not present

## 2016-07-15 DIAGNOSIS — Z1389 Encounter for screening for other disorder: Secondary | ICD-10-CM | POA: Diagnosis not present

## 2016-07-15 DIAGNOSIS — Z7189 Other specified counseling: Secondary | ICD-10-CM | POA: Diagnosis not present

## 2016-07-15 DIAGNOSIS — G309 Alzheimer's disease, unspecified: Secondary | ICD-10-CM | POA: Diagnosis not present

## 2016-07-15 DIAGNOSIS — Z79899 Other long term (current) drug therapy: Secondary | ICD-10-CM | POA: Diagnosis not present

## 2016-07-15 DIAGNOSIS — F0280 Dementia in other diseases classified elsewhere without behavioral disturbance: Secondary | ICD-10-CM | POA: Diagnosis not present

## 2016-07-15 DIAGNOSIS — R5383 Other fatigue: Secondary | ICD-10-CM | POA: Diagnosis not present

## 2016-07-15 DIAGNOSIS — Z0000 Encounter for general adult medical examination without abnormal findings: Secondary | ICD-10-CM | POA: Diagnosis not present

## 2016-07-15 DIAGNOSIS — Z6822 Body mass index (BMI) 22.0-22.9, adult: Secondary | ICD-10-CM | POA: Diagnosis not present

## 2016-07-15 DIAGNOSIS — Z299 Encounter for prophylactic measures, unspecified: Secondary | ICD-10-CM | POA: Diagnosis not present

## 2016-07-15 DIAGNOSIS — I10 Essential (primary) hypertension: Secondary | ICD-10-CM | POA: Diagnosis not present

## 2016-07-25 ENCOUNTER — Other Ambulatory Visit: Payer: Self-pay | Admitting: Cardiology

## 2016-08-04 DIAGNOSIS — I10 Essential (primary) hypertension: Secondary | ICD-10-CM | POA: Diagnosis not present

## 2016-08-04 DIAGNOSIS — M810 Age-related osteoporosis without current pathological fracture: Secondary | ICD-10-CM | POA: Diagnosis not present

## 2016-08-04 DIAGNOSIS — I4891 Unspecified atrial fibrillation: Secondary | ICD-10-CM | POA: Diagnosis not present

## 2016-08-04 DIAGNOSIS — M159 Polyosteoarthritis, unspecified: Secondary | ICD-10-CM | POA: Diagnosis not present

## 2016-09-10 DIAGNOSIS — R5383 Other fatigue: Secondary | ICD-10-CM | POA: Diagnosis not present

## 2016-09-30 DIAGNOSIS — M159 Polyosteoarthritis, unspecified: Secondary | ICD-10-CM | POA: Diagnosis not present

## 2016-09-30 DIAGNOSIS — M810 Age-related osteoporosis without current pathological fracture: Secondary | ICD-10-CM | POA: Diagnosis not present

## 2016-09-30 DIAGNOSIS — I10 Essential (primary) hypertension: Secondary | ICD-10-CM | POA: Diagnosis not present

## 2016-09-30 DIAGNOSIS — I4891 Unspecified atrial fibrillation: Secondary | ICD-10-CM | POA: Diagnosis not present

## 2016-10-15 DIAGNOSIS — Z6822 Body mass index (BMI) 22.0-22.9, adult: Secondary | ICD-10-CM | POA: Diagnosis not present

## 2016-10-15 DIAGNOSIS — I719 Aortic aneurysm of unspecified site, without rupture: Secondary | ICD-10-CM | POA: Diagnosis not present

## 2016-10-15 DIAGNOSIS — I4891 Unspecified atrial fibrillation: Secondary | ICD-10-CM | POA: Diagnosis not present

## 2016-10-15 DIAGNOSIS — Z299 Encounter for prophylactic measures, unspecified: Secondary | ICD-10-CM | POA: Diagnosis not present

## 2016-10-15 DIAGNOSIS — I10 Essential (primary) hypertension: Secondary | ICD-10-CM | POA: Diagnosis not present

## 2016-12-09 DIAGNOSIS — H353131 Nonexudative age-related macular degeneration, bilateral, early dry stage: Secondary | ICD-10-CM | POA: Diagnosis not present

## 2016-12-09 DIAGNOSIS — R5383 Other fatigue: Secondary | ICD-10-CM | POA: Diagnosis not present

## 2016-12-11 DIAGNOSIS — F0390 Unspecified dementia without behavioral disturbance: Secondary | ICD-10-CM | POA: Diagnosis not present

## 2016-12-11 DIAGNOSIS — I719 Aortic aneurysm of unspecified site, without rupture: Secondary | ICD-10-CM | POA: Diagnosis not present

## 2016-12-11 DIAGNOSIS — E039 Hypothyroidism, unspecified: Secondary | ICD-10-CM | POA: Diagnosis not present

## 2016-12-11 DIAGNOSIS — I4891 Unspecified atrial fibrillation: Secondary | ICD-10-CM | POA: Diagnosis not present

## 2016-12-11 DIAGNOSIS — Z299 Encounter for prophylactic measures, unspecified: Secondary | ICD-10-CM | POA: Diagnosis not present

## 2016-12-11 DIAGNOSIS — Z6822 Body mass index (BMI) 22.0-22.9, adult: Secondary | ICD-10-CM | POA: Diagnosis not present

## 2016-12-11 DIAGNOSIS — E7800 Pure hypercholesterolemia, unspecified: Secondary | ICD-10-CM | POA: Diagnosis not present

## 2016-12-18 DIAGNOSIS — M810 Age-related osteoporosis without current pathological fracture: Secondary | ICD-10-CM | POA: Diagnosis not present

## 2016-12-18 DIAGNOSIS — I4891 Unspecified atrial fibrillation: Secondary | ICD-10-CM | POA: Diagnosis not present

## 2016-12-18 DIAGNOSIS — I10 Essential (primary) hypertension: Secondary | ICD-10-CM | POA: Diagnosis not present

## 2016-12-18 DIAGNOSIS — M159 Polyosteoarthritis, unspecified: Secondary | ICD-10-CM | POA: Diagnosis not present

## 2017-01-29 ENCOUNTER — Other Ambulatory Visit: Payer: Self-pay | Admitting: Cardiology

## 2017-03-09 DIAGNOSIS — M159 Polyosteoarthritis, unspecified: Secondary | ICD-10-CM | POA: Diagnosis not present

## 2017-03-09 DIAGNOSIS — I4891 Unspecified atrial fibrillation: Secondary | ICD-10-CM | POA: Diagnosis not present

## 2017-03-09 DIAGNOSIS — M810 Age-related osteoporosis without current pathological fracture: Secondary | ICD-10-CM | POA: Diagnosis not present

## 2017-03-09 DIAGNOSIS — I10 Essential (primary) hypertension: Secondary | ICD-10-CM | POA: Diagnosis not present

## 2017-03-30 DIAGNOSIS — H26493 Other secondary cataract, bilateral: Secondary | ICD-10-CM | POA: Diagnosis not present

## 2017-03-30 DIAGNOSIS — Z961 Presence of intraocular lens: Secondary | ICD-10-CM | POA: Diagnosis not present

## 2017-03-30 DIAGNOSIS — H02889 Meibomian gland dysfunction of unspecified eye, unspecified eyelid: Secondary | ICD-10-CM | POA: Diagnosis not present

## 2017-03-30 DIAGNOSIS — H353131 Nonexudative age-related macular degeneration, bilateral, early dry stage: Secondary | ICD-10-CM | POA: Diagnosis not present

## 2017-05-14 DIAGNOSIS — M159 Polyosteoarthritis, unspecified: Secondary | ICD-10-CM | POA: Diagnosis not present

## 2017-05-14 DIAGNOSIS — M810 Age-related osteoporosis without current pathological fracture: Secondary | ICD-10-CM | POA: Diagnosis not present

## 2017-05-14 DIAGNOSIS — I10 Essential (primary) hypertension: Secondary | ICD-10-CM | POA: Diagnosis not present

## 2017-05-14 DIAGNOSIS — I4891 Unspecified atrial fibrillation: Secondary | ICD-10-CM | POA: Diagnosis not present

## 2017-06-02 DIAGNOSIS — H26493 Other secondary cataract, bilateral: Secondary | ICD-10-CM | POA: Diagnosis not present

## 2017-06-02 DIAGNOSIS — H353131 Nonexudative age-related macular degeneration, bilateral, early dry stage: Secondary | ICD-10-CM | POA: Diagnosis not present

## 2017-06-02 DIAGNOSIS — Z961 Presence of intraocular lens: Secondary | ICD-10-CM | POA: Diagnosis not present

## 2017-06-18 DIAGNOSIS — I10 Essential (primary) hypertension: Secondary | ICD-10-CM | POA: Diagnosis not present

## 2017-06-18 DIAGNOSIS — I4891 Unspecified atrial fibrillation: Secondary | ICD-10-CM | POA: Diagnosis not present

## 2017-06-18 DIAGNOSIS — M810 Age-related osteoporosis without current pathological fracture: Secondary | ICD-10-CM | POA: Diagnosis not present

## 2017-06-18 DIAGNOSIS — M159 Polyosteoarthritis, unspecified: Secondary | ICD-10-CM | POA: Diagnosis not present

## 2017-07-21 DIAGNOSIS — Z789 Other specified health status: Secondary | ICD-10-CM | POA: Diagnosis not present

## 2017-07-21 DIAGNOSIS — L259 Unspecified contact dermatitis, unspecified cause: Secondary | ICD-10-CM | POA: Diagnosis not present

## 2017-07-21 DIAGNOSIS — F329 Major depressive disorder, single episode, unspecified: Secondary | ICD-10-CM | POA: Diagnosis not present

## 2017-07-21 DIAGNOSIS — Z7189 Other specified counseling: Secondary | ICD-10-CM | POA: Diagnosis not present

## 2017-07-21 DIAGNOSIS — Z1331 Encounter for screening for depression: Secondary | ICD-10-CM | POA: Diagnosis not present

## 2017-07-21 DIAGNOSIS — Z1211 Encounter for screening for malignant neoplasm of colon: Secondary | ICD-10-CM | POA: Diagnosis not present

## 2017-07-21 DIAGNOSIS — R5383 Other fatigue: Secondary | ICD-10-CM | POA: Diagnosis not present

## 2017-07-21 DIAGNOSIS — Z0000 Encounter for general adult medical examination without abnormal findings: Secondary | ICD-10-CM | POA: Diagnosis not present

## 2017-07-21 DIAGNOSIS — Z299 Encounter for prophylactic measures, unspecified: Secondary | ICD-10-CM | POA: Diagnosis not present

## 2017-07-21 DIAGNOSIS — Z79899 Other long term (current) drug therapy: Secondary | ICD-10-CM | POA: Diagnosis not present

## 2017-07-21 DIAGNOSIS — I10 Essential (primary) hypertension: Secondary | ICD-10-CM | POA: Diagnosis not present

## 2017-07-21 DIAGNOSIS — Z6822 Body mass index (BMI) 22.0-22.9, adult: Secondary | ICD-10-CM | POA: Diagnosis not present

## 2017-07-21 DIAGNOSIS — E039 Hypothyroidism, unspecified: Secondary | ICD-10-CM | POA: Diagnosis not present

## 2017-07-21 DIAGNOSIS — E7800 Pure hypercholesterolemia, unspecified: Secondary | ICD-10-CM | POA: Diagnosis not present

## 2017-07-21 DIAGNOSIS — Z1339 Encounter for screening examination for other mental health and behavioral disorders: Secondary | ICD-10-CM | POA: Diagnosis not present

## 2017-07-28 DIAGNOSIS — I4891 Unspecified atrial fibrillation: Secondary | ICD-10-CM | POA: Diagnosis not present

## 2017-07-28 DIAGNOSIS — I10 Essential (primary) hypertension: Secondary | ICD-10-CM | POA: Diagnosis not present

## 2017-07-28 DIAGNOSIS — M810 Age-related osteoporosis without current pathological fracture: Secondary | ICD-10-CM | POA: Diagnosis not present

## 2017-07-28 DIAGNOSIS — M159 Polyosteoarthritis, unspecified: Secondary | ICD-10-CM | POA: Diagnosis not present

## 2017-09-15 DIAGNOSIS — M159 Polyosteoarthritis, unspecified: Secondary | ICD-10-CM | POA: Diagnosis not present

## 2017-09-15 DIAGNOSIS — I4891 Unspecified atrial fibrillation: Secondary | ICD-10-CM | POA: Diagnosis not present

## 2017-09-15 DIAGNOSIS — M810 Age-related osteoporosis without current pathological fracture: Secondary | ICD-10-CM | POA: Diagnosis not present

## 2017-09-15 DIAGNOSIS — I10 Essential (primary) hypertension: Secondary | ICD-10-CM | POA: Diagnosis not present

## 2017-09-24 DIAGNOSIS — E2839 Other primary ovarian failure: Secondary | ICD-10-CM | POA: Diagnosis not present

## 2017-10-22 DIAGNOSIS — F0390 Unspecified dementia without behavioral disturbance: Secondary | ICD-10-CM | POA: Diagnosis not present

## 2017-10-22 DIAGNOSIS — M810 Age-related osteoporosis without current pathological fracture: Secondary | ICD-10-CM | POA: Diagnosis not present

## 2017-10-22 DIAGNOSIS — I4891 Unspecified atrial fibrillation: Secondary | ICD-10-CM | POA: Diagnosis not present

## 2017-10-22 DIAGNOSIS — Z299 Encounter for prophylactic measures, unspecified: Secondary | ICD-10-CM | POA: Diagnosis not present

## 2017-10-22 DIAGNOSIS — G309 Alzheimer's disease, unspecified: Secondary | ICD-10-CM | POA: Diagnosis not present

## 2017-10-22 DIAGNOSIS — I10 Essential (primary) hypertension: Secondary | ICD-10-CM | POA: Diagnosis not present

## 2017-10-22 DIAGNOSIS — Z6822 Body mass index (BMI) 22.0-22.9, adult: Secondary | ICD-10-CM | POA: Diagnosis not present

## 2017-10-26 DIAGNOSIS — M159 Polyosteoarthritis, unspecified: Secondary | ICD-10-CM | POA: Diagnosis not present

## 2017-10-26 DIAGNOSIS — I4891 Unspecified atrial fibrillation: Secondary | ICD-10-CM | POA: Diagnosis not present

## 2017-10-26 DIAGNOSIS — M810 Age-related osteoporosis without current pathological fracture: Secondary | ICD-10-CM | POA: Diagnosis not present

## 2017-10-26 DIAGNOSIS — I10 Essential (primary) hypertension: Secondary | ICD-10-CM | POA: Diagnosis not present

## 2017-11-30 DIAGNOSIS — I10 Essential (primary) hypertension: Secondary | ICD-10-CM | POA: Diagnosis not present

## 2017-11-30 DIAGNOSIS — M810 Age-related osteoporosis without current pathological fracture: Secondary | ICD-10-CM | POA: Diagnosis not present

## 2017-11-30 DIAGNOSIS — I4891 Unspecified atrial fibrillation: Secondary | ICD-10-CM | POA: Diagnosis not present

## 2017-11-30 DIAGNOSIS — M159 Polyosteoarthritis, unspecified: Secondary | ICD-10-CM | POA: Diagnosis not present

## 2017-12-16 ENCOUNTER — Encounter: Payer: Self-pay | Admitting: *Deleted

## 2017-12-17 ENCOUNTER — Encounter: Payer: Self-pay | Admitting: Cardiology

## 2017-12-17 ENCOUNTER — Ambulatory Visit (INDEPENDENT_AMBULATORY_CARE_PROVIDER_SITE_OTHER): Payer: Medicare Other | Admitting: Cardiology

## 2017-12-17 VITALS — BP 144/72 | HR 60 | Ht 68.0 in | Wt 149.4 lb

## 2017-12-17 DIAGNOSIS — Z952 Presence of prosthetic heart valve: Secondary | ICD-10-CM | POA: Diagnosis not present

## 2017-12-17 DIAGNOSIS — I10 Essential (primary) hypertension: Secondary | ICD-10-CM | POA: Diagnosis not present

## 2017-12-17 NOTE — Patient Instructions (Signed)

## 2017-12-17 NOTE — Progress Notes (Signed)
Clinical Summary Lauren Morrow is a 84 y.o.female seen today for follow up of the following medical problems.   1. Abdominal aortic aneurysm  - prior repair 11/2011, she reports signed off by vascuar.  -denies any abdominal symptoms.   2. Severe aortic stenosis  - s/p AVR with a bioprosthetic valve Nov 2012  - no recent SOB/DOE. No chest pain, no syncope   3. HTN - compliant with meds   Past Medical History:  Diagnosis Date  . Anemia    hx of  . Anxiety   . Aortic stenosis    Dr. Rod Can, saw last 09/11/11  . Arthritis   . Atrial fibrillation ([place])    Post op, sees  Dr. Fletcher Anon  . Cataracts, bilateral   . Dizziness   . GERD (gastroesophageal reflux disease)   . Hypertension    sees Dr. Woody Seller, primary  . Low back pain   . Osteomyelitis ([place])    history of osteomyelistis in the leg  . Peripheral vascular disease ([place])   . Seasonal allergies   . Shortness of breath    "gets short of breath at time since the heart surgery 2012"  . Urinary tract infection    hx of  . Varicose veins      Allergies  Allergen Reactions  . Avelox [Moxifloxacin Hcl In Nacl] Rash    Avelox began at approximately 0800. At 0807 pt noted to have red-streaking proximal to the PIV site, up the left arm, to the neck/chest area. Avelox immediately discontinued. Pt received about 1/4 the dose or 100mg . Vital signs remained stable. The anesthesiologist and surgeon were notified and shown the site of red, rashy streaking.  Marland Kitchen Penicillins Swelling  . Sulfa Antibiotics Other (See Comments)    unknown     Current Outpatient Medications  Medication Sig Dispense Refill  . Biotin 5000 MCG TABS Take 5,000 mcg by mouth daily.    . calcium-vitamin D (OSCAL WITH D) 500-200 MG-UNIT per tablet Take 1 tablet by mouth 2 (two) times daily.      . Cholecalciferol (VITAMIN D) 1000 UNITS capsule Take 1,000 Units by mouth daily.    . fish oil-omega-3 fatty acids 1000 MG capsule Take 1 g by mouth daily.       . Garlic 1884 MG CAPS Take 1,000 mg by mouth daily.    . metoprolol tartrate (LOPRESSOR) 25 MG tablet TAKE ONE TABLET BY MOUTH TWICE DAILY 180 tablet 3  . Multiple Vitamin (MULTIVITAMIN) tablet Take 1 tablet by mouth daily.      Marland Kitchen omeprazole (PRILOSEC) 20 MG capsule Take 20 mg by mouth daily.    . raloxifene (EVISTA) 60 MG tablet Take 60 mg by mouth daily.     . sertraline (ZOLOFT) 50 MG tablet Take 50 mg by mouth daily.      No current facility-administered medications for this visit.      Past Surgical History:  Procedure Laterality Date  . ABDOMINAL AORTIC ANEURYSM REPAIR  11/21/2011   Procedure: ANEURYSM ABDOMINAL AORTIC REPAIR;  Surgeon: Mal Misty, MD;  Location[facility] OR;  Service: Vascular;  Laterality: N/A;  using 16 x 8 mm x 40 cm hemashield graft  . AORTIC VALVE REPLACEMENT  11/22/2010   87mm pericardial valve serial#[id number], model 3300tfx  . AORTIC VALVE REPLACEMENT     In 2012 with a bioprosthetic valve  . bladder tact     1960's  . CARDIAC CATHETERIZATION    . CATARACT EXTRACTION  W/PHACO Right 12/09/2012   Procedure: CATARACT EXTRACTION PHACO AND INTRAOCULAR LENS PLACEMENT (IOC);  Surgeon: Tonny Branch, MD;  Location: [facility];  Service: Ophthalmology;  Laterality: Right;  CDE 19.32  . CATARACT EXTRACTION W/PHACO Left 12/27/2012   Procedure: LEFT EYE CATARACT EXTRACTION PHACO AND INTRAOCULAR LENS PLACEMENT ;  Surgeon: Tonny Branch, MD;  Location: [facility];  Service: Ophthalmology;  Laterality: Left;  CDE 10.78  . LAPAROTOMY  11/21/2011   Procedure: EXPLORATORY LAPAROTOMY;  Surgeon: Mal Misty, MD;  Location: [place];  Service: Vascular;  Laterality: N/A;  exploratory laparotomy, evacuation of hematoma, suture ligation of lumbar arterial branch   . PATCH ANGIOPLASTY  11/21/2011   Procedure: PATCH ANGIOPLASTY;  Surgeon: Mal Misty, MD;  Location: [place];  Service: Vascular;  Laterality: Right;  Iliac vein  . SPINE SURGERY  1967  1976   dr. Maryjean Ka     Allergies    Allergen Reactions  . Avelox [Moxifloxacin Hcl In Nacl] Rash    Avelox began at approximately 0800. At 0807 pt noted to have red-streaking proximal to the PIV site, up the left arm, to the neck/chest area. Avelox immediately discontinued. Pt received about 1/4 the dose or 100mg . Vital signs remained stable. The anesthesiologist and surgeon were notified and shown the site of red, rashy streaking.  Marland Kitchen Penicillins Swelling  . Sulfa Antibiotics Other (See Comments)    unknown      Family History  Problem Relation Age of Onset  . Other Mother        Varicose Veins  . Cancer Sister   . Hyperlipidemia Brother   . Other Brother      Social History Lauren Morrow reports that she has never smoked. She has never used smokeless tobacco. Lauren Morrow reports no history of alcohol use.   Review of Systems CONSTITUTIONAL: No weight loss, fever, chills, weakness or fatigue.  HEENT: Eyes: No visual loss, blurred vision, double vision or yellow sclerae.No hearing loss, sneezing, congestion, runny nose or sore throat.  SKIN: No rash or itching.  CARDIOVASCULAR: per hpi RESPIRATORY: No shortness of breath, cough or sputum.  GASTROINTESTINAL: No anorexia, nausea, vomiting or diarrhea. No abdominal pain or blood.  GENITOURINARY: No burning on urination, no polyuria NEUROLOGICAL: No headache, dizziness, syncope, paralysis, ataxia, numbness or tingling in the extremities. No change in bowel or bladder control.  MUSCULOSKELETAL: No muscle, back pain, joint pain or stiffness.  LYMPHATICS: No enlarged nodes. No history of splenectomy.  PSYCHIATRIC: No history of depression or anxiety.  ENDOCRINOLOGIC: No reports of sweating, cold or heat intolerance. No polyuria or polydipsia.  Marland Kitchen   Physical Examination Vitals:   12/17/17 1514  BP: (!) 144/72  Pulse: 60  SpO2: 100%   Vitals:   12/17/17 1514  Weight: 149 lb 6.4 oz (67.8 kg)  Height: 5\' 8"  (1.727 m)    Gen: resting comfortably, no acute  distress HEENT: no scleral icterus, pupils equal round and reactive, no palptable cervical adenopathy,  CV: RRR, 2/6 systolic murmur rusb, no jvd Resp: Clear to auscultation bilaterally GI: abdomen is soft, non-tender, non-distended, normal bowel sounds, no hepatosplenomegaly MSK: extremities are warm, no edema.  Skin: warm, no rash Neuro:  no focal deficits Psych: appropriate affect   Diagnostic Studies 11/2011 Echo: LVEF 78-46%, grade I diastolic dysfunction, normal AVR, mild MR, mild TR    Assessment and Plan  1. Aortic stenosis s/p valve replacement -doing well without symptoms, continue to monitor.    2. HTN -reasonable control  given her advanced age, continue current meds  EKG today shows SR, no ischemic changes      Arnoldo Lenis, M.D.

## 2017-12-22 ENCOUNTER — Encounter: Payer: Self-pay | Admitting: *Deleted

## 2017-12-23 DIAGNOSIS — I10 Essential (primary) hypertension: Secondary | ICD-10-CM | POA: Diagnosis not present

## 2017-12-23 DIAGNOSIS — M810 Age-related osteoporosis without current pathological fracture: Secondary | ICD-10-CM | POA: Diagnosis not present

## 2017-12-23 DIAGNOSIS — I4891 Unspecified atrial fibrillation: Secondary | ICD-10-CM | POA: Diagnosis not present

## 2017-12-23 DIAGNOSIS — M159 Polyosteoarthritis, unspecified: Secondary | ICD-10-CM | POA: Diagnosis not present

## 2018-01-21 ENCOUNTER — Other Ambulatory Visit: Payer: Self-pay | Admitting: Cardiology

## 2018-02-08 DIAGNOSIS — I10 Essential (primary) hypertension: Secondary | ICD-10-CM | POA: Diagnosis not present

## 2018-02-08 DIAGNOSIS — M159 Polyosteoarthritis, unspecified: Secondary | ICD-10-CM | POA: Diagnosis not present

## 2018-02-08 DIAGNOSIS — I4891 Unspecified atrial fibrillation: Secondary | ICD-10-CM | POA: Diagnosis not present

## 2018-02-08 DIAGNOSIS — M810 Age-related osteoporosis without current pathological fracture: Secondary | ICD-10-CM | POA: Diagnosis not present

## 2018-03-10 DIAGNOSIS — I10 Essential (primary) hypertension: Secondary | ICD-10-CM | POA: Diagnosis not present

## 2018-03-10 DIAGNOSIS — M159 Polyosteoarthritis, unspecified: Secondary | ICD-10-CM | POA: Diagnosis not present

## 2018-03-10 DIAGNOSIS — M810 Age-related osteoporosis without current pathological fracture: Secondary | ICD-10-CM | POA: Diagnosis not present

## 2018-03-10 DIAGNOSIS — I4891 Unspecified atrial fibrillation: Secondary | ICD-10-CM | POA: Diagnosis not present

## 2018-04-27 DIAGNOSIS — M159 Polyosteoarthritis, unspecified: Secondary | ICD-10-CM | POA: Diagnosis not present

## 2018-04-27 DIAGNOSIS — I10 Essential (primary) hypertension: Secondary | ICD-10-CM | POA: Diagnosis not present

## 2018-04-27 DIAGNOSIS — M810 Age-related osteoporosis without current pathological fracture: Secondary | ICD-10-CM | POA: Diagnosis not present

## 2018-04-27 DIAGNOSIS — I4891 Unspecified atrial fibrillation: Secondary | ICD-10-CM | POA: Diagnosis not present

## 2018-05-24 DIAGNOSIS — M810 Age-related osteoporosis without current pathological fracture: Secondary | ICD-10-CM | POA: Diagnosis not present

## 2018-05-24 DIAGNOSIS — I10 Essential (primary) hypertension: Secondary | ICD-10-CM | POA: Diagnosis not present

## 2018-05-24 DIAGNOSIS — M159 Polyosteoarthritis, unspecified: Secondary | ICD-10-CM | POA: Diagnosis not present

## 2018-05-24 DIAGNOSIS — I4891 Unspecified atrial fibrillation: Secondary | ICD-10-CM | POA: Diagnosis not present

## 2018-07-06 DIAGNOSIS — I10 Essential (primary) hypertension: Secondary | ICD-10-CM | POA: Diagnosis not present

## 2018-07-06 DIAGNOSIS — M159 Polyosteoarthritis, unspecified: Secondary | ICD-10-CM | POA: Diagnosis not present

## 2018-07-06 DIAGNOSIS — I4891 Unspecified atrial fibrillation: Secondary | ICD-10-CM | POA: Diagnosis not present

## 2018-07-06 DIAGNOSIS — M810 Age-related osteoporosis without current pathological fracture: Secondary | ICD-10-CM | POA: Diagnosis not present

## 2018-08-17 DIAGNOSIS — I719 Aortic aneurysm of unspecified site, without rupture: Secondary | ICD-10-CM | POA: Diagnosis not present

## 2018-08-17 DIAGNOSIS — Z299 Encounter for prophylactic measures, unspecified: Secondary | ICD-10-CM | POA: Diagnosis not present

## 2018-08-17 DIAGNOSIS — R5383 Other fatigue: Secondary | ICD-10-CM | POA: Diagnosis not present

## 2018-08-17 DIAGNOSIS — Z1211 Encounter for screening for malignant neoplasm of colon: Secondary | ICD-10-CM | POA: Diagnosis not present

## 2018-08-17 DIAGNOSIS — I10 Essential (primary) hypertension: Secondary | ICD-10-CM | POA: Diagnosis not present

## 2018-08-17 DIAGNOSIS — Z6822 Body mass index (BMI) 22.0-22.9, adult: Secondary | ICD-10-CM | POA: Diagnosis not present

## 2018-08-17 DIAGNOSIS — Z7189 Other specified counseling: Secondary | ICD-10-CM | POA: Diagnosis not present

## 2018-08-17 DIAGNOSIS — Z0000 Encounter for general adult medical examination without abnormal findings: Secondary | ICD-10-CM | POA: Diagnosis not present

## 2018-08-17 DIAGNOSIS — Z1331 Encounter for screening for depression: Secondary | ICD-10-CM | POA: Diagnosis not present

## 2018-08-17 DIAGNOSIS — E039 Hypothyroidism, unspecified: Secondary | ICD-10-CM | POA: Diagnosis not present

## 2018-08-17 DIAGNOSIS — Z1339 Encounter for screening examination for other mental health and behavioral disorders: Secondary | ICD-10-CM | POA: Diagnosis not present

## 2018-08-17 DIAGNOSIS — Z79899 Other long term (current) drug therapy: Secondary | ICD-10-CM | POA: Diagnosis not present

## 2018-08-17 DIAGNOSIS — E7800 Pure hypercholesterolemia, unspecified: Secondary | ICD-10-CM | POA: Diagnosis not present

## 2018-08-30 DIAGNOSIS — I10 Essential (primary) hypertension: Secondary | ICD-10-CM | POA: Diagnosis not present

## 2018-08-30 DIAGNOSIS — I4891 Unspecified atrial fibrillation: Secondary | ICD-10-CM | POA: Diagnosis not present

## 2018-08-30 DIAGNOSIS — M159 Polyosteoarthritis, unspecified: Secondary | ICD-10-CM | POA: Diagnosis not present

## 2018-08-30 DIAGNOSIS — M810 Age-related osteoporosis without current pathological fracture: Secondary | ICD-10-CM | POA: Diagnosis not present

## 2018-09-20 ENCOUNTER — Encounter: Payer: Self-pay | Admitting: Cardiology

## 2018-09-20 DIAGNOSIS — K921 Melena: Secondary | ICD-10-CM | POA: Diagnosis not present

## 2018-09-20 DIAGNOSIS — D62 Acute posthemorrhagic anemia: Secondary | ICD-10-CM | POA: Diagnosis not present

## 2018-09-20 DIAGNOSIS — I774 Celiac artery compression syndrome: Secondary | ICD-10-CM | POA: Diagnosis not present

## 2018-09-20 DIAGNOSIS — F0390 Unspecified dementia without behavioral disturbance: Secondary | ICD-10-CM | POA: Diagnosis not present

## 2018-09-20 DIAGNOSIS — I482 Chronic atrial fibrillation, unspecified: Secondary | ICD-10-CM | POA: Diagnosis not present

## 2018-09-20 DIAGNOSIS — I745 Embolism and thrombosis of iliac artery: Secondary | ICD-10-CM | POA: Diagnosis not present

## 2018-09-20 DIAGNOSIS — I10 Essential (primary) hypertension: Secondary | ICD-10-CM | POA: Diagnosis not present

## 2018-09-20 DIAGNOSIS — Z952 Presence of prosthetic heart valve: Secondary | ICD-10-CM | POA: Diagnosis not present

## 2018-09-20 DIAGNOSIS — K922 Gastrointestinal hemorrhage, unspecified: Secondary | ICD-10-CM | POA: Diagnosis not present

## 2018-09-20 DIAGNOSIS — K5791 Diverticulosis of intestine, part unspecified, without perforation or abscess with bleeding: Secondary | ICD-10-CM | POA: Diagnosis not present

## 2018-09-21 ENCOUNTER — Encounter: Payer: Self-pay | Admitting: Cardiology

## 2018-09-21 DIAGNOSIS — I745 Embolism and thrombosis of iliac artery: Secondary | ICD-10-CM | POA: Diagnosis not present

## 2018-09-21 DIAGNOSIS — K922 Gastrointestinal hemorrhage, unspecified: Secondary | ICD-10-CM | POA: Diagnosis not present

## 2018-09-21 DIAGNOSIS — F0390 Unspecified dementia without behavioral disturbance: Secondary | ICD-10-CM | POA: Diagnosis present

## 2018-09-21 DIAGNOSIS — M810 Age-related osteoporosis without current pathological fracture: Secondary | ICD-10-CM | POA: Diagnosis present

## 2018-09-21 DIAGNOSIS — K921 Melena: Secondary | ICD-10-CM | POA: Diagnosis not present

## 2018-09-21 DIAGNOSIS — I482 Chronic atrial fibrillation, unspecified: Secondary | ICD-10-CM | POA: Diagnosis present

## 2018-09-21 DIAGNOSIS — E039 Hypothyroidism, unspecified: Secondary | ICD-10-CM | POA: Diagnosis present

## 2018-09-21 DIAGNOSIS — I10 Essential (primary) hypertension: Secondary | ICD-10-CM | POA: Diagnosis present

## 2018-09-21 DIAGNOSIS — E876 Hypokalemia: Secondary | ICD-10-CM | POA: Diagnosis not present

## 2018-09-21 DIAGNOSIS — Z87891 Personal history of nicotine dependence: Secondary | ICD-10-CM | POA: Diagnosis not present

## 2018-09-21 DIAGNOSIS — Z7982 Long term (current) use of aspirin: Secondary | ICD-10-CM | POA: Diagnosis not present

## 2018-09-21 DIAGNOSIS — K439 Ventral hernia without obstruction or gangrene: Secondary | ICD-10-CM | POA: Diagnosis present

## 2018-09-21 DIAGNOSIS — Z952 Presence of prosthetic heart valve: Secondary | ICD-10-CM | POA: Diagnosis not present

## 2018-09-21 DIAGNOSIS — M1990 Unspecified osteoarthritis, unspecified site: Secondary | ICD-10-CM | POA: Diagnosis present

## 2018-09-21 DIAGNOSIS — D62 Acute posthemorrhagic anemia: Secondary | ICD-10-CM | POA: Diagnosis present

## 2018-09-21 DIAGNOSIS — K5791 Diverticulosis of intestine, part unspecified, without perforation or abscess with bleeding: Secondary | ICD-10-CM | POA: Diagnosis not present

## 2018-09-21 DIAGNOSIS — I774 Celiac artery compression syndrome: Secondary | ICD-10-CM | POA: Diagnosis not present

## 2018-09-24 ENCOUNTER — Encounter: Payer: Self-pay | Admitting: Cardiology

## 2018-09-26 DIAGNOSIS — F0390 Unspecified dementia without behavioral disturbance: Secondary | ICD-10-CM | POA: Diagnosis not present

## 2018-09-26 DIAGNOSIS — Z952 Presence of prosthetic heart valve: Secondary | ICD-10-CM | POA: Diagnosis not present

## 2018-09-26 DIAGNOSIS — M810 Age-related osteoporosis without current pathological fracture: Secondary | ICD-10-CM | POA: Diagnosis not present

## 2018-09-26 DIAGNOSIS — K551 Chronic vascular disorders of intestine: Secondary | ICD-10-CM | POA: Diagnosis not present

## 2018-09-26 DIAGNOSIS — K439 Ventral hernia without obstruction or gangrene: Secondary | ICD-10-CM | POA: Diagnosis not present

## 2018-09-26 DIAGNOSIS — M1990 Unspecified osteoarthritis, unspecified site: Secondary | ICD-10-CM | POA: Diagnosis not present

## 2018-09-26 DIAGNOSIS — R32 Unspecified urinary incontinence: Secondary | ICD-10-CM | POA: Diagnosis not present

## 2018-09-26 DIAGNOSIS — K5731 Diverticulosis of large intestine without perforation or abscess with bleeding: Secondary | ICD-10-CM | POA: Diagnosis not present

## 2018-09-26 DIAGNOSIS — I119 Hypertensive heart disease without heart failure: Secondary | ICD-10-CM | POA: Diagnosis not present

## 2018-09-26 DIAGNOSIS — M4802 Spinal stenosis, cervical region: Secondary | ICD-10-CM | POA: Diagnosis not present

## 2018-09-26 DIAGNOSIS — I482 Chronic atrial fibrillation, unspecified: Secondary | ICD-10-CM | POA: Diagnosis not present

## 2018-09-27 DIAGNOSIS — F0390 Unspecified dementia without behavioral disturbance: Secondary | ICD-10-CM | POA: Diagnosis not present

## 2018-09-27 DIAGNOSIS — K551 Chronic vascular disorders of intestine: Secondary | ICD-10-CM | POA: Diagnosis not present

## 2018-09-27 DIAGNOSIS — K439 Ventral hernia without obstruction or gangrene: Secondary | ICD-10-CM | POA: Diagnosis not present

## 2018-09-27 DIAGNOSIS — I119 Hypertensive heart disease without heart failure: Secondary | ICD-10-CM | POA: Diagnosis not present

## 2018-09-27 DIAGNOSIS — K5731 Diverticulosis of large intestine without perforation or abscess with bleeding: Secondary | ICD-10-CM | POA: Diagnosis not present

## 2018-09-27 DIAGNOSIS — I482 Chronic atrial fibrillation, unspecified: Secondary | ICD-10-CM | POA: Diagnosis not present

## 2018-09-29 DIAGNOSIS — K551 Chronic vascular disorders of intestine: Secondary | ICD-10-CM | POA: Diagnosis not present

## 2018-09-29 DIAGNOSIS — K5731 Diverticulosis of large intestine without perforation or abscess with bleeding: Secondary | ICD-10-CM | POA: Diagnosis not present

## 2018-09-29 DIAGNOSIS — I482 Chronic atrial fibrillation, unspecified: Secondary | ICD-10-CM | POA: Diagnosis not present

## 2018-09-29 DIAGNOSIS — I119 Hypertensive heart disease without heart failure: Secondary | ICD-10-CM | POA: Diagnosis not present

## 2018-09-29 DIAGNOSIS — K439 Ventral hernia without obstruction or gangrene: Secondary | ICD-10-CM | POA: Diagnosis not present

## 2018-09-29 DIAGNOSIS — F0390 Unspecified dementia without behavioral disturbance: Secondary | ICD-10-CM | POA: Diagnosis not present

## 2018-09-30 DIAGNOSIS — F0390 Unspecified dementia without behavioral disturbance: Secondary | ICD-10-CM | POA: Diagnosis not present

## 2018-09-30 DIAGNOSIS — I482 Chronic atrial fibrillation, unspecified: Secondary | ICD-10-CM | POA: Diagnosis not present

## 2018-09-30 DIAGNOSIS — I119 Hypertensive heart disease without heart failure: Secondary | ICD-10-CM | POA: Diagnosis not present

## 2018-09-30 DIAGNOSIS — K551 Chronic vascular disorders of intestine: Secondary | ICD-10-CM | POA: Diagnosis not present

## 2018-09-30 DIAGNOSIS — K5731 Diverticulosis of large intestine without perforation or abscess with bleeding: Secondary | ICD-10-CM | POA: Diagnosis not present

## 2018-09-30 DIAGNOSIS — K439 Ventral hernia without obstruction or gangrene: Secondary | ICD-10-CM | POA: Diagnosis not present

## 2018-10-01 DIAGNOSIS — R0600 Dyspnea, unspecified: Secondary | ICD-10-CM | POA: Diagnosis not present

## 2018-10-01 DIAGNOSIS — I4891 Unspecified atrial fibrillation: Secondary | ICD-10-CM | POA: Diagnosis not present

## 2018-10-01 DIAGNOSIS — F0390 Unspecified dementia without behavioral disturbance: Secondary | ICD-10-CM | POA: Diagnosis not present

## 2018-10-01 DIAGNOSIS — Z299 Encounter for prophylactic measures, unspecified: Secondary | ICD-10-CM | POA: Diagnosis not present

## 2018-10-01 DIAGNOSIS — Z6822 Body mass index (BMI) 22.0-22.9, adult: Secondary | ICD-10-CM | POA: Diagnosis not present

## 2018-10-01 DIAGNOSIS — K922 Gastrointestinal hemorrhage, unspecified: Secondary | ICD-10-CM | POA: Diagnosis not present

## 2018-10-01 DIAGNOSIS — I10 Essential (primary) hypertension: Secondary | ICD-10-CM | POA: Diagnosis not present

## 2018-10-04 DIAGNOSIS — F0390 Unspecified dementia without behavioral disturbance: Secondary | ICD-10-CM | POA: Diagnosis not present

## 2018-10-04 DIAGNOSIS — I482 Chronic atrial fibrillation, unspecified: Secondary | ICD-10-CM | POA: Diagnosis not present

## 2018-10-04 DIAGNOSIS — K5731 Diverticulosis of large intestine without perforation or abscess with bleeding: Secondary | ICD-10-CM | POA: Diagnosis not present

## 2018-10-04 DIAGNOSIS — K439 Ventral hernia without obstruction or gangrene: Secondary | ICD-10-CM | POA: Diagnosis not present

## 2018-10-04 DIAGNOSIS — I119 Hypertensive heart disease without heart failure: Secondary | ICD-10-CM | POA: Diagnosis not present

## 2018-10-04 DIAGNOSIS — K551 Chronic vascular disorders of intestine: Secondary | ICD-10-CM | POA: Diagnosis not present

## 2018-10-06 DIAGNOSIS — K5731 Diverticulosis of large intestine without perforation or abscess with bleeding: Secondary | ICD-10-CM | POA: Diagnosis not present

## 2018-10-06 DIAGNOSIS — K551 Chronic vascular disorders of intestine: Secondary | ICD-10-CM | POA: Diagnosis not present

## 2018-10-06 DIAGNOSIS — F0390 Unspecified dementia without behavioral disturbance: Secondary | ICD-10-CM | POA: Diagnosis not present

## 2018-10-06 DIAGNOSIS — K439 Ventral hernia without obstruction or gangrene: Secondary | ICD-10-CM | POA: Diagnosis not present

## 2018-10-06 DIAGNOSIS — I119 Hypertensive heart disease without heart failure: Secondary | ICD-10-CM | POA: Diagnosis not present

## 2018-10-06 DIAGNOSIS — I482 Chronic atrial fibrillation, unspecified: Secondary | ICD-10-CM | POA: Diagnosis not present

## 2018-10-12 DIAGNOSIS — I119 Hypertensive heart disease without heart failure: Secondary | ICD-10-CM | POA: Diagnosis not present

## 2018-10-12 DIAGNOSIS — F0390 Unspecified dementia without behavioral disturbance: Secondary | ICD-10-CM | POA: Diagnosis not present

## 2018-10-12 DIAGNOSIS — K5731 Diverticulosis of large intestine without perforation or abscess with bleeding: Secondary | ICD-10-CM | POA: Diagnosis not present

## 2018-10-12 DIAGNOSIS — K551 Chronic vascular disorders of intestine: Secondary | ICD-10-CM | POA: Diagnosis not present

## 2018-10-12 DIAGNOSIS — K439 Ventral hernia without obstruction or gangrene: Secondary | ICD-10-CM | POA: Diagnosis not present

## 2018-10-12 DIAGNOSIS — I482 Chronic atrial fibrillation, unspecified: Secondary | ICD-10-CM | POA: Diagnosis not present

## 2018-10-13 DIAGNOSIS — I482 Chronic atrial fibrillation, unspecified: Secondary | ICD-10-CM | POA: Diagnosis not present

## 2018-10-13 DIAGNOSIS — F0390 Unspecified dementia without behavioral disturbance: Secondary | ICD-10-CM | POA: Diagnosis not present

## 2018-10-13 DIAGNOSIS — I119 Hypertensive heart disease without heart failure: Secondary | ICD-10-CM | POA: Diagnosis not present

## 2018-10-13 DIAGNOSIS — K5731 Diverticulosis of large intestine without perforation or abscess with bleeding: Secondary | ICD-10-CM | POA: Diagnosis not present

## 2018-10-13 DIAGNOSIS — K551 Chronic vascular disorders of intestine: Secondary | ICD-10-CM | POA: Diagnosis not present

## 2018-10-13 DIAGNOSIS — K439 Ventral hernia without obstruction or gangrene: Secondary | ICD-10-CM | POA: Diagnosis not present

## 2018-10-14 DIAGNOSIS — I119 Hypertensive heart disease without heart failure: Secondary | ICD-10-CM | POA: Diagnosis not present

## 2018-10-14 DIAGNOSIS — K551 Chronic vascular disorders of intestine: Secondary | ICD-10-CM | POA: Diagnosis not present

## 2018-10-14 DIAGNOSIS — K5731 Diverticulosis of large intestine without perforation or abscess with bleeding: Secondary | ICD-10-CM | POA: Diagnosis not present

## 2018-10-14 DIAGNOSIS — I482 Chronic atrial fibrillation, unspecified: Secondary | ICD-10-CM | POA: Diagnosis not present

## 2018-10-14 DIAGNOSIS — F0390 Unspecified dementia without behavioral disturbance: Secondary | ICD-10-CM | POA: Diagnosis not present

## 2018-10-14 DIAGNOSIS — K439 Ventral hernia without obstruction or gangrene: Secondary | ICD-10-CM | POA: Diagnosis not present

## 2018-10-19 DIAGNOSIS — K5731 Diverticulosis of large intestine without perforation or abscess with bleeding: Secondary | ICD-10-CM | POA: Diagnosis not present

## 2018-10-19 DIAGNOSIS — K551 Chronic vascular disorders of intestine: Secondary | ICD-10-CM | POA: Diagnosis not present

## 2018-10-19 DIAGNOSIS — I482 Chronic atrial fibrillation, unspecified: Secondary | ICD-10-CM | POA: Diagnosis not present

## 2018-10-19 DIAGNOSIS — K439 Ventral hernia without obstruction or gangrene: Secondary | ICD-10-CM | POA: Diagnosis not present

## 2018-10-19 DIAGNOSIS — F0390 Unspecified dementia without behavioral disturbance: Secondary | ICD-10-CM | POA: Diagnosis not present

## 2018-10-19 DIAGNOSIS — I119 Hypertensive heart disease without heart failure: Secondary | ICD-10-CM | POA: Diagnosis not present

## 2018-10-21 DIAGNOSIS — K551 Chronic vascular disorders of intestine: Secondary | ICD-10-CM | POA: Diagnosis not present

## 2018-10-21 DIAGNOSIS — I482 Chronic atrial fibrillation, unspecified: Secondary | ICD-10-CM | POA: Diagnosis not present

## 2018-10-21 DIAGNOSIS — K5731 Diverticulosis of large intestine without perforation or abscess with bleeding: Secondary | ICD-10-CM | POA: Diagnosis not present

## 2018-10-21 DIAGNOSIS — I119 Hypertensive heart disease without heart failure: Secondary | ICD-10-CM | POA: Diagnosis not present

## 2018-10-21 DIAGNOSIS — F0390 Unspecified dementia without behavioral disturbance: Secondary | ICD-10-CM | POA: Diagnosis not present

## 2018-10-21 DIAGNOSIS — K439 Ventral hernia without obstruction or gangrene: Secondary | ICD-10-CM | POA: Diagnosis not present

## 2018-10-25 DIAGNOSIS — K551 Chronic vascular disorders of intestine: Secondary | ICD-10-CM | POA: Diagnosis not present

## 2018-10-25 DIAGNOSIS — F0390 Unspecified dementia without behavioral disturbance: Secondary | ICD-10-CM | POA: Diagnosis not present

## 2018-10-25 DIAGNOSIS — K5731 Diverticulosis of large intestine without perforation or abscess with bleeding: Secondary | ICD-10-CM | POA: Diagnosis not present

## 2018-10-25 DIAGNOSIS — I119 Hypertensive heart disease without heart failure: Secondary | ICD-10-CM | POA: Diagnosis not present

## 2018-10-25 DIAGNOSIS — I482 Chronic atrial fibrillation, unspecified: Secondary | ICD-10-CM | POA: Diagnosis not present

## 2018-10-25 DIAGNOSIS — K439 Ventral hernia without obstruction or gangrene: Secondary | ICD-10-CM | POA: Diagnosis not present

## 2018-10-26 DIAGNOSIS — I482 Chronic atrial fibrillation, unspecified: Secondary | ICD-10-CM | POA: Diagnosis not present

## 2018-10-26 DIAGNOSIS — K5731 Diverticulosis of large intestine without perforation or abscess with bleeding: Secondary | ICD-10-CM | POA: Diagnosis not present

## 2018-10-26 DIAGNOSIS — R32 Unspecified urinary incontinence: Secondary | ICD-10-CM | POA: Diagnosis not present

## 2018-10-26 DIAGNOSIS — K439 Ventral hernia without obstruction or gangrene: Secondary | ICD-10-CM | POA: Diagnosis not present

## 2018-10-26 DIAGNOSIS — M4802 Spinal stenosis, cervical region: Secondary | ICD-10-CM | POA: Diagnosis not present

## 2018-10-26 DIAGNOSIS — Z952 Presence of prosthetic heart valve: Secondary | ICD-10-CM | POA: Diagnosis not present

## 2018-10-26 DIAGNOSIS — K551 Chronic vascular disorders of intestine: Secondary | ICD-10-CM | POA: Diagnosis not present

## 2018-10-26 DIAGNOSIS — I119 Hypertensive heart disease without heart failure: Secondary | ICD-10-CM | POA: Diagnosis not present

## 2018-10-26 DIAGNOSIS — F0390 Unspecified dementia without behavioral disturbance: Secondary | ICD-10-CM | POA: Diagnosis not present

## 2018-10-26 DIAGNOSIS — M810 Age-related osteoporosis without current pathological fracture: Secondary | ICD-10-CM | POA: Diagnosis not present

## 2018-10-26 DIAGNOSIS — M1990 Unspecified osteoarthritis, unspecified site: Secondary | ICD-10-CM | POA: Diagnosis not present

## 2018-10-27 DIAGNOSIS — F0390 Unspecified dementia without behavioral disturbance: Secondary | ICD-10-CM | POA: Diagnosis not present

## 2018-10-27 DIAGNOSIS — I482 Chronic atrial fibrillation, unspecified: Secondary | ICD-10-CM | POA: Diagnosis not present

## 2018-10-27 DIAGNOSIS — I119 Hypertensive heart disease without heart failure: Secondary | ICD-10-CM | POA: Diagnosis not present

## 2018-10-27 DIAGNOSIS — K551 Chronic vascular disorders of intestine: Secondary | ICD-10-CM | POA: Diagnosis not present

## 2018-10-27 DIAGNOSIS — K5731 Diverticulosis of large intestine without perforation or abscess with bleeding: Secondary | ICD-10-CM | POA: Diagnosis not present

## 2018-10-27 DIAGNOSIS — K439 Ventral hernia without obstruction or gangrene: Secondary | ICD-10-CM | POA: Diagnosis not present

## 2018-11-03 DIAGNOSIS — I119 Hypertensive heart disease without heart failure: Secondary | ICD-10-CM | POA: Diagnosis not present

## 2018-11-03 DIAGNOSIS — K5731 Diverticulosis of large intestine without perforation or abscess with bleeding: Secondary | ICD-10-CM | POA: Diagnosis not present

## 2018-11-03 DIAGNOSIS — F0390 Unspecified dementia without behavioral disturbance: Secondary | ICD-10-CM | POA: Diagnosis not present

## 2018-11-03 DIAGNOSIS — I482 Chronic atrial fibrillation, unspecified: Secondary | ICD-10-CM | POA: Diagnosis not present

## 2018-11-03 DIAGNOSIS — K551 Chronic vascular disorders of intestine: Secondary | ICD-10-CM | POA: Diagnosis not present

## 2018-11-03 DIAGNOSIS — K439 Ventral hernia without obstruction or gangrene: Secondary | ICD-10-CM | POA: Diagnosis not present

## 2018-11-08 DIAGNOSIS — I482 Chronic atrial fibrillation, unspecified: Secondary | ICD-10-CM | POA: Diagnosis not present

## 2018-11-08 DIAGNOSIS — K551 Chronic vascular disorders of intestine: Secondary | ICD-10-CM | POA: Diagnosis not present

## 2018-11-08 DIAGNOSIS — K5731 Diverticulosis of large intestine without perforation or abscess with bleeding: Secondary | ICD-10-CM | POA: Diagnosis not present

## 2018-11-08 DIAGNOSIS — F0390 Unspecified dementia without behavioral disturbance: Secondary | ICD-10-CM | POA: Diagnosis not present

## 2018-11-08 DIAGNOSIS — I119 Hypertensive heart disease without heart failure: Secondary | ICD-10-CM | POA: Diagnosis not present

## 2018-11-08 DIAGNOSIS — K439 Ventral hernia without obstruction or gangrene: Secondary | ICD-10-CM | POA: Diagnosis not present

## 2018-11-10 DIAGNOSIS — K5731 Diverticulosis of large intestine without perforation or abscess with bleeding: Secondary | ICD-10-CM | POA: Diagnosis not present

## 2018-11-10 DIAGNOSIS — F0390 Unspecified dementia without behavioral disturbance: Secondary | ICD-10-CM | POA: Diagnosis not present

## 2018-11-10 DIAGNOSIS — K551 Chronic vascular disorders of intestine: Secondary | ICD-10-CM | POA: Diagnosis not present

## 2018-11-10 DIAGNOSIS — I482 Chronic atrial fibrillation, unspecified: Secondary | ICD-10-CM | POA: Diagnosis not present

## 2018-11-10 DIAGNOSIS — I119 Hypertensive heart disease without heart failure: Secondary | ICD-10-CM | POA: Diagnosis not present

## 2018-11-10 DIAGNOSIS — K439 Ventral hernia without obstruction or gangrene: Secondary | ICD-10-CM | POA: Diagnosis not present

## 2018-11-17 DIAGNOSIS — K551 Chronic vascular disorders of intestine: Secondary | ICD-10-CM | POA: Diagnosis not present

## 2018-11-17 DIAGNOSIS — I482 Chronic atrial fibrillation, unspecified: Secondary | ICD-10-CM | POA: Diagnosis not present

## 2018-11-17 DIAGNOSIS — I119 Hypertensive heart disease without heart failure: Secondary | ICD-10-CM | POA: Diagnosis not present

## 2018-11-17 DIAGNOSIS — K439 Ventral hernia without obstruction or gangrene: Secondary | ICD-10-CM | POA: Diagnosis not present

## 2018-11-17 DIAGNOSIS — K5731 Diverticulosis of large intestine without perforation or abscess with bleeding: Secondary | ICD-10-CM | POA: Diagnosis not present

## 2018-11-17 DIAGNOSIS — F0390 Unspecified dementia without behavioral disturbance: Secondary | ICD-10-CM | POA: Diagnosis not present

## 2018-11-22 DIAGNOSIS — K439 Ventral hernia without obstruction or gangrene: Secondary | ICD-10-CM | POA: Diagnosis not present

## 2018-11-22 DIAGNOSIS — I482 Chronic atrial fibrillation, unspecified: Secondary | ICD-10-CM | POA: Diagnosis not present

## 2018-11-22 DIAGNOSIS — F0390 Unspecified dementia without behavioral disturbance: Secondary | ICD-10-CM | POA: Diagnosis not present

## 2018-11-22 DIAGNOSIS — K5731 Diverticulosis of large intestine without perforation or abscess with bleeding: Secondary | ICD-10-CM | POA: Diagnosis not present

## 2018-11-22 DIAGNOSIS — K551 Chronic vascular disorders of intestine: Secondary | ICD-10-CM | POA: Diagnosis not present

## 2018-11-22 DIAGNOSIS — I119 Hypertensive heart disease without heart failure: Secondary | ICD-10-CM | POA: Diagnosis not present

## 2018-12-01 DIAGNOSIS — K5731 Diverticulosis of large intestine without perforation or abscess with bleeding: Secondary | ICD-10-CM | POA: Diagnosis not present

## 2018-12-19 ENCOUNTER — Encounter: Payer: Self-pay | Admitting: Cardiology

## 2019-03-17 ENCOUNTER — Telehealth: Payer: Self-pay | Admitting: Cardiology

## 2019-03-17 NOTE — Telephone Encounter (Signed)
Virtual Visit Pre-Appointment Phone Call  "(Name), I am calling you today to discuss your upcoming appointment. We are currently trying to limit exposure to the virus that causes COVID-19 by seeing patients at home rather than in the office."  1. "What is the BEST phone number to call the day of the visit?" - include this in appointment notes  2. "Do you have or have access to (through a family member/friend) a smartphone with video capability that we can use for your visit?" a. If yes - list this number in appt notes as "cell" (if different from BEST phone #) and list the appointment type as a VIDEO visit in appointment notes b. If no - list the appointment type as a PHONE visit in appointment notes  3. Confirm consent - "In the setting of the current Covid19 crisis, you are scheduled for a (phone or video) visit with your provider on (date) at (time).  Just as we do with many in-office visits, in order for you to participate in this visit, we must obtain consent.  If you'd like, I can send this to your mychart (if signed up) or email for you to review.  Otherwise, I can obtain your verbal consent now.  All virtual visits are billed to your insurance company just like a normal visit would be.  By agreeing to a virtual visit, we'd like you to understand that the technology does not allow for your provider to perform an examination, and thus may limit your provider's ability to fully assess your condition. If your provider identifies any concerns that need to be evaluated in person, we will make arrangements to do so.  Finally, though the technology is pretty good, we cannot assure that it will always work on either your or our end, and in the setting of a video visit, we may have to convert it to a phone-only visit.  In either situation, we cannot ensure that we have a secure connection.  Are you willing to proceed?" STAFF: Did the patient verbally acknowledge consent to telehealth visit? Document  YES/NO here: YES  4. Advise patient to be prepared - "Two hours prior to your appointment, go ahead and check your blood pressure, pulse, oxygen saturation, and your weight (if you have the equipment to check those) and write them all down. When your visit starts, your provider will ask you for this information. If you have an Apple Watch or Kardia device, please plan to have heart rate information ready on the day of your appointment. Please have a pen and paper handy nearby the day of the visit as well."  5. Give patient instructions for MyChart download to smartphone OR Doximity/Doxy.me as below if video visit (depending on what platform provider is using)  6. Inform patient they will receive a phone call 15 minutes prior to their appointment time (may be from unknown caller ID) so they should be prepared to answer    TELEPHONE CALL NOTE  Lauren Morrow has been deemed a candidate for a follow-up tele-health visit to limit community exposure during the Covid-19 pandemic. I spoke with the patient via phone to ensure availability of phone/video source, confirm preferred email & phone number, and discuss instructions and expectations.  I reminded Lauren Morrow to be prepared with any vital sign and/or heart rhythm information that could potentially be obtained via home monitoring, at the time of her visit. I reminded Lauren Morrow to expect a phone call prior to  her visit.  Lauren Morrow 03/17/2019 2:26 PM   INSTRUCTIONS FOR DOWNLOADING THE MYCHART APP TO SMARTPHONE  - The patient must first make sure to have activated MyChart and know their login information - If Apple, go to [employer] and type in MyChart in the search bar and download the app. If Android, ask patient to go to [employer] and type in [place] in the search bar and download the app. The app is free but as with any other app downloads, their phone may require them to verify saved payment information or  Apple/Android password.  - The patient will need to then log into the app with their MyChart username and password, and select [facility] as their healthcare provider to link the account. When it is time for your visit, go to the MyChart app, find appointments, and click Begin Video Visit. Be sure to Select Allow for your device to access the Microphone and Camera for your visit. You will then be connected, and your provider will be with you shortly.  **If they have any issues connecting, or need assistance please contact MyChart service desk [phone])**  **If using a computer, in order to ensure the best quality for their visit they will need to use either of the following Internet Browsers: [employer], or Google Chrome**  IF USING DOXIMITY or DOXY.ME - The patient will receive a link just prior to their visit by text.     FULL LENGTH CONSENT FOR TELE-HEALTH VISIT   I hereby voluntarily request, consent and authorize [facility] and its employed or contracted physicians, physician assistants, nurse practitioners or other licensed health care professionals (the Practitioner), to provide me with telemedicine health care services (the "Services") as deemed necessary by the treating Practitioner. I acknowledge and consent to receive the Services by the Practitioner via telemedicine. I understand that the telemedicine visit will involve communicating with the Practitioner through live audiovisual communication technology and the disclosure of certain medical information by electronic transmission. I acknowledge that I have been given the opportunity to request an in-person assessment or other available alternative prior to the telemedicine visit and am voluntarily participating in the telemedicine visit.  I understand that I have the right to withhold or withdraw my consent to the use of telemedicine in the course of my care at any time, without affecting my right to future care  or treatment, and that the Practitioner or I may terminate the telemedicine visit at any time. I understand that I have the right to inspect all information obtained and/or recorded in the course of the telemedicine visit and may receive copies of available information for a reasonable fee.  I understand that some of the potential risks of receiving the Services via telemedicine include:  Marland Kitchen Delay or interruption in medical evaluation due to technological equipment failure or disruption; . Information transmitted may not be sufficient (e.g. poor resolution of images) to allow for appropriate medical decision making by the Practitioner; and/or  . In rare instances, security protocols could fail, causing a breach of personal health information.  Furthermore, I acknowledge that it is my responsibility to provide information about my medical history, conditions and care that is complete and accurate to the best of my ability. I acknowledge that Practitioner's advice, recommendations, and/or decision may be based on factors not within their control, such as incomplete or inaccurate data provided by me or distortions of diagnostic images or specimens that may result from electronic transmissions. I  understand that the practice of medicine is not an exact science and that Practitioner makes no warranties or guarantees regarding treatment outcomes. I acknowledge that I will receive a copy of this consent concurrently upon execution via email to the email address I last provided but may also request a printed copy by calling the office of [facility].    I understand that my insurance will be billed for this visit.   I have read or had this consent read to me. . I understand the contents of this consent, which adequately explains the benefits and risks of the Services being provided via telemedicine.  . I have been provided ample opportunity to ask questions regarding this consent and the Services and have had  my questions answered to my satisfaction. . I give my informed consent for the services to be provided through the use of telemedicine in my medical care  By participating in this telemedicine visit I agree to the above.   Virtual Visit Pre-Appointment Phone Call  "(Name), I am calling you today to discuss your upcoming appointment. We are currently trying to limit exposure to the virus that causes COVID-19 by seeing patients at home rather than in the office."  7. "What is the BEST phone number to call the day of the visit?" - include this in appointment notes  8. "Do you have or have access to (through a family member/friend) a smartphone with video capability that we can use for your visit?" a. If yes - list this number in appt notes as "cell" (if different from BEST phone #) and list the appointment type as a VIDEO visit in appointment notes b. If no - list the appointment type as a PHONE visit in appointment notes  9. Confirm consent - "In the setting of the current Covid19 crisis, you are scheduled for a (phone or video) visit with your provider on (date) at (time).  Just as we do with many in-office visits, in order for you to participate in this visit, we must obtain consent.  If you'd like, I can send this to your mychart (if signed up) or email for you to review.  Otherwise, I can obtain your verbal consent now.  All virtual visits are billed to your insurance company just like a normal visit would be.  By agreeing to a virtual visit, we'd like you to understand that the technology does not allow for your provider to perform an examination, and thus may limit your provider's ability to fully assess your condition. If your provider identifies any concerns that need to be evaluated in person, we will make arrangements to do so.  Finally, though the technology is pretty good, we cannot assure that it will always work on either your or our end, and in the setting of a video visit, we may have  to convert it to a phone-only visit.  In either situation, we cannot ensure that we have a secure connection.  Are you willing to proceed?" STAFF: Did the patient verbally acknowledge consent to telehealth visit? Document YES/NO here: YES 10.   11. Advise patient to be prepared - "Two hours prior to your appointment, go ahead and check your blood pressure, pulse, oxygen saturation, and your weight (if you have the equipment to check those) and write them all down. When your visit starts, your provider will ask you for this information. If you have an Apple Watch or Kardia device, please plan to have heart rate information ready on  the day of your appointment. Please have a pen and paper handy nearby the day of the visit as well."  12. Give patient instructions for MyChart download to smartphone OR Doximity/Doxy.me as below if video visit (depending on what platform provider is using)  13. Inform patient they will receive a phone call 15 minutes prior to their appointment time (may be from unknown caller ID) so they should be prepared to answer    TELEPHONE CALL NOTE  Lauren Morrow has been deemed a candidate for a follow-up tele-health visit to limit community exposure during the Covid-19 pandemic. I spoke with the patient via phone to ensure availability of phone/video source, confirm preferred email & phone number, and discuss instructions and expectations.  I reminded Lauren Morrow to be prepared with any vital sign and/or heart rhythm information that could potentially be obtained via home monitoring, at the time of her visit. I reminded Lauren Morrow to expect a phone call prior to her visit.  Lauren Morrow 03/17/2019 2:26 PM   INSTRUCTIONS FOR DOWNLOADING THE MYCHART APP TO SMARTPHONE  - The patient must first make sure to have activated MyChart and know their login information - If Apple, go to [employer] and type in MyChart in the search bar and download the app. If  Android, ask patient to go to [employer] and type in [place] in the search bar and download the app. The app is free but as with any other app downloads, their phone may require them to verify saved payment information or Apple/Android password.  - The patient will need to then log into the app with their MyChart username and password, and select [facility] as their healthcare provider to link the account. When it is time for your visit, go to the MyChart app, find appointments, and click Begin Video Visit. Be sure to Select Allow for your device to access the Microphone and Camera for your visit. You will then be connected, and your provider will be with you shortly.  **If they have any issues connecting, or need assistance please contact MyChart service desk [phone])**  **If using a computer, in order to ensure the best quality for their visit they will need to use either of the following Internet Browsers: [employer], or Google Chrome**  IF USING DOXIMITY or DOXY.ME - The patient will receive a link just prior to their visit by text.     FULL LENGTH CONSENT FOR TELE-HEALTH VISIT   I hereby voluntarily request, consent and authorize [facility] and its employed or contracted physicians, physician assistants, nurse practitioners or other licensed health care professionals (the Practitioner), to provide me with telemedicine health care services (the "Services") as deemed necessary by the treating Practitioner. I acknowledge and consent to receive the Services by the Practitioner via telemedicine. I understand that the telemedicine visit will involve communicating with the Practitioner through live audiovisual communication technology and the disclosure of certain medical information by electronic transmission. I acknowledge that I have been given the opportunity to request an in-person assessment or other available alternative prior to the telemedicine visit and am  voluntarily participating in the telemedicine visit.  I understand that I have the right to withhold or withdraw my consent to the use of telemedicine in the course of my care at any time, without affecting my right to future care or treatment, and that the Practitioner or I may terminate the telemedicine visit at any time. I understand that  I have the right to inspect all information obtained and/or recorded in the course of the telemedicine visit and may receive copies of available information for a reasonable fee.  I understand that some of the potential risks of receiving the Services via telemedicine include:  Marland Kitchen Delay or interruption in medical evaluation due to technological equipment failure or disruption; . Information transmitted may not be sufficient (e.g. poor resolution of images) to allow for appropriate medical decision making by the Practitioner; and/or  . In rare instances, security protocols could fail, causing a breach of personal health information.  Furthermore, I acknowledge that it is my responsibility to provide information about my medical history, conditions and care that is complete and accurate to the best of my ability. I acknowledge that Practitioner's advice, recommendations, and/or decision may be based on factors not within their control, such as incomplete or inaccurate data provided by me or distortions of diagnostic images or specimens that may result from electronic transmissions. I understand that the practice of medicine is not an exact science and that Practitioner makes no warranties or guarantees regarding treatment outcomes. I acknowledge that I will receive a copy of this consent concurrently upon execution via email to the email address I last provided but may also request a printed copy by calling the office of [facility].    I understand that my insurance will be billed for this visit.   I have read or had this consent read to me. . I understand the  contents of this consent, which adequately explains the benefits and risks of the Services being provided via telemedicine.  . I have been provided ample opportunity to ask questions regarding this consent and the Services and have had my questions answered to my satisfaction. . I give my informed consent for the services to be provided through the use of telemedicine in my medical care  By participating in this telemedicine visit I agree to the above.

## 2019-03-23 ENCOUNTER — Encounter: Payer: Self-pay | Admitting: *Deleted

## 2019-03-23 ENCOUNTER — Telehealth (INDEPENDENT_AMBULATORY_CARE_PROVIDER_SITE_OTHER): Payer: Medicare Other | Admitting: Cardiology

## 2019-03-23 ENCOUNTER — Encounter: Payer: Self-pay | Admitting: Cardiology

## 2019-03-23 VITALS — Ht 67.0 in | Wt 150.0 lb

## 2019-03-23 DIAGNOSIS — Z952 Presence of prosthetic heart valve: Secondary | ICD-10-CM

## 2019-03-23 DIAGNOSIS — I10 Essential (primary) hypertension: Secondary | ICD-10-CM | POA: Diagnosis not present

## 2019-03-23 NOTE — Patient Instructions (Signed)

## 2019-03-23 NOTE — Progress Notes (Signed)
Virtual Visit via Telephone Note   This visit type was conducted due to national recommendations for restrictions regarding the COVID-19 Pandemic (e.g. social distancing) in an effort to limit this patient's exposure and mitigate transmission in our community.  Due to her co-morbid illnesses, this patient is at least at moderate risk for complications without adequate follow up.  This format is felt to be most appropriate for this patient at this time.  The patient did not have access to video technology/had technical difficulties with video requiring transitioning to audio format only (telephone).  All issues noted in this document were discussed and addressed.  No physical exam could be performed with this format.  Please refer to the patient's chart for her  consent to telehealth for [facility].   The patient was identified using 2 identifiers.   Date:  03/23/2019   ID:  Lauren Morrow, DOB 05/25/1924, MRN [id number]  Patient Location: Home Provider Location: Office  PCP:  Glenda Chroman, MD  Cardiologist:  Carlyle Dolly, MD  Electrophysiologist:  None   Evaluation Performed:  Follow-Up Visit  Chief Complaint:  Follow up  History of Present Illness:    Lauren Morrow is a 86 y.o. female seen today for follow up of the following medical problems.   1. Abdominal aortic aneurysm  - prior repair 11/2011, she reports signed off by vascuar. -denies any abdominal symptoms.   2. Severe aortic stenosis  - s/p AVR with a bioprosthetic valve Nov 2012  - SOB/DOE. No chest pain. - off ASA due to recent GI bleed  3. HTN - compliant with meds  4. GI bleed - reports admission few months ago at [facility] - ASA was stopped    The patient does not have symptoms concerning for COVID-19 infection (fever, chills, cough, or new shortness of breath).    Past Medical History:  Diagnosis Date  . Anemia    hx of  . Anxiety   . Aortic stenosis    Dr. Rod Can, saw  last 09/11/11  . Arthritis   . Atrial fibrillation ([place])    Post op, sees  Dr. Fletcher Anon  . Cataracts, bilateral   . Dizziness   . GERD (gastroesophageal reflux disease)   . Hypertension    sees Dr. Woody Seller, primary  . Low back pain   . Osteomyelitis ([place])    history of osteomyelistis in the leg  . Peripheral vascular disease ([place])   . Seasonal allergies   . Shortness of breath    "gets short of breath at time since the heart surgery 2012"  . Urinary tract infection    hx of  . Varicose veins    Past Surgical History:  Procedure Laterality Date  . ABDOMINAL AORTIC ANEURYSM REPAIR  11/21/2011   Procedure: ANEURYSM ABDOMINAL AORTIC REPAIR;  Surgeon: Mal Misty, MD;  Location: [facility] OR;  Service: Vascular;  Laterality: N/A;  using 16 x 8 mm x 40 cm hemashield graft  . AORTIC VALVE REPLACEMENT  11/22/2010   49mm pericardial valve serial#[id number], model [id number]  . AORTIC VALVE REPLACEMENT     In 2012 with a bioprosthetic valve  . bladder tact     1960's  . CARDIAC CATHETERIZATION    . CATARACT EXTRACTION W/PHACO Right 12/09/2012   Procedure: CATARACT EXTRACTION PHACO AND INTRAOCULAR LENS PLACEMENT ([place]);  Surgeon: Tonny Stewart Sasaki, MD;  Location: [facility];  Service: Ophthalmology;  Laterality: Right;  CDE 19.32  . CATARACT EXTRACTION W/PHACO Left 12/27/2012  Procedure: LEFT EYE CATARACT EXTRACTION PHACO AND INTRAOCULAR LENS PLACEMENT ;  Surgeon: Tonny Tabia Landowski, MD;  Location: [facility];  Service: Ophthalmology;  Laterality: Left;  CDE 10.78  . LAPAROTOMY  11/21/2011   Procedure: EXPLORATORY LAPAROTOMY;  Surgeon: Mal Misty, MD;  Location: [facility];  Service: Vascular;  Laterality: N/A;  exploratory laparotomy, evacuation of hematoma, suture ligation of lumbar arterial Johathon Overturf   . PATCH ANGIOPLASTY  11/21/2011   Procedure: PATCH ANGIOPLASTY;  Surgeon: Mal Misty, MD;  Location: [facility];  Service: Vascular;  Laterality: Right;  Iliac vein  . SPINE SURGERY  1967  1976   dr. Maryjean Ka     Current Meds    Medication Sig  . Biotin 5000 MCG TABS Take 5,000 mcg by mouth daily.  . calcium-vitamin D (OSCAL WITH D) 500-200 MG-UNIT per tablet Take 1 tablet by mouth 2 (two) times daily.    . Cholecalciferol (VITAMIN D) 1000 UNITS capsule Take 1,000 Units by mouth daily.  . fish oil-omega-3 fatty acids 1000 MG capsule Take 1 g by mouth daily.    . Garlic 123XX123 MG CAPS Take 1,000 mg by mouth daily.  Marland Kitchen levothyroxine (SYNTHROID, LEVOTHROID) 50 MCG tablet Take 50 mcg by mouth daily before breakfast.  . metoprolol tartrate (LOPRESSOR) 25 MG tablet TAKE ONE TABLET BY MOUTH TWICE DAILY  . Multiple Vitamin (MULTIVITAMIN) tablet Take 1 tablet by mouth daily.    . raloxifene (EVISTA) 60 MG tablet Take 60 mg by mouth daily.   . sertraline (ZOLOFT) 50 MG tablet Take 50 mg by mouth daily.   . [DISCONTINUED] aspirin EC 81 MG tablet Take 81 mg by mouth daily.     Allergies:   Avelox [moxifloxacin hcl in nacl], Penicillins, and Sulfa antibiotics   Social History   Tobacco Use  . Smoking status: Never Smoker  . Smokeless tobacco: Never Used  Substance Use Topics  . Alcohol use: No    Alcohol/week: 0.0 standard drinks  . Drug use: No     Family Hx: The patient's family history includes Cancer in her sister; Hyperlipidemia in her brother; Other in her brother and mother.  ROS:   Please see the history of present illness.    All other systems reviewed and are negative.   Prior CV studies:   The following studies were reviewed today:  11/2011 Echo: LVEF [id number], grade I diastolic dysfunction, normal AVR, mild MR, mild TR   Labs/Other Tests and Data Reviewed:    EKG:  No ECG reviewed.  Recent Labs: No results found for requested labs within last 8760 hours.   Recent Lipid Panel No results found for: CHOL, TRIG, HDL, CHOLHDL, LDLCALC, LDLDIRECT  Wt Readings from Last 3 Encounters:  03/23/19 150 lb (68 kg)  12/17/17 149 lb 6.4 oz (67.8 kg)  01/14/16 151 lb 6.4 oz (68.7 kg)     Objective:     Vital Signs:  Ht 5\' 7"  (1.702 m)   Wt 150 lb (68 kg)   BMI 23.49 kg/m    Normal affect. Normal speech pattern and tone. Comfortable, no apparent distress. No audible signs of SOb or wheezing .  ASSESSMENT & PLAN:    1. Aortic stenosis s/p valve replacement -no recent symptoms - she is off ASA due to GI bleed a few months ago, stay off at this time. Request hosptial records.    2. HTN -continue current meds  COVID-19 Education: The signs and symptoms of COVID-19 were discussed with the patient and how  to seek care for testing (follow up with PCP or arrange E-visit).  The importance of social distancing was discussed today.  Time:   Today, I have spent 20 minutes with the patient with telehealth technology discussing the above problems.     Medication Adjustments/Labs and Tests Ordered: Current medicines are reviewed at length with the patient today.  Concerns regarding medicines are outlined above.   Tests Ordered: No orders of the defined types were placed in this encounter.   Medication Changes: No orders of the defined types were placed in this encounter.   Follow Up:  Either In Person or Virtual in 6 month(s)  Signed, Carlyle Dolly, MD  03/23/2019 12:12 PM    [place]

## 2019-04-04 DIAGNOSIS — L218 Other seborrheic dermatitis: Secondary | ICD-10-CM | POA: Diagnosis not present

## 2019-04-26 DIAGNOSIS — M810 Age-related osteoporosis without current pathological fracture: Secondary | ICD-10-CM | POA: Diagnosis not present

## 2019-04-26 DIAGNOSIS — M159 Polyosteoarthritis, unspecified: Secondary | ICD-10-CM | POA: Diagnosis not present

## 2019-04-26 DIAGNOSIS — I10 Essential (primary) hypertension: Secondary | ICD-10-CM | POA: Diagnosis not present

## 2019-04-26 DIAGNOSIS — I4891 Unspecified atrial fibrillation: Secondary | ICD-10-CM | POA: Diagnosis not present

## 2019-04-27 DIAGNOSIS — Z952 Presence of prosthetic heart valve: Secondary | ICD-10-CM | POA: Diagnosis not present

## 2019-04-27 DIAGNOSIS — Z20822 Contact with and (suspected) exposure to covid-19: Secondary | ICD-10-CM | POA: Diagnosis not present

## 2019-04-27 DIAGNOSIS — K922 Gastrointestinal hemorrhage, unspecified: Secondary | ICD-10-CM | POA: Diagnosis not present

## 2019-04-27 DIAGNOSIS — K625 Hemorrhage of anus and rectum: Secondary | ICD-10-CM | POA: Diagnosis not present

## 2019-04-27 DIAGNOSIS — K51311 Ulcerative (chronic) rectosigmoiditis with rectal bleeding: Secondary | ICD-10-CM | POA: Diagnosis not present

## 2019-04-27 DIAGNOSIS — E039 Hypothyroidism, unspecified: Secondary | ICD-10-CM | POA: Diagnosis not present

## 2019-04-27 DIAGNOSIS — G309 Alzheimer's disease, unspecified: Secondary | ICD-10-CM | POA: Diagnosis not present

## 2019-04-27 DIAGNOSIS — I10 Essential (primary) hypertension: Secondary | ICD-10-CM | POA: Diagnosis not present

## 2019-04-27 DIAGNOSIS — R05 Cough: Secondary | ICD-10-CM | POA: Diagnosis not present

## 2019-04-27 DIAGNOSIS — F329 Major depressive disorder, single episode, unspecified: Secondary | ICD-10-CM | POA: Diagnosis not present

## 2019-04-27 DIAGNOSIS — R55 Syncope and collapse: Secondary | ICD-10-CM | POA: Diagnosis not present

## 2019-04-27 DIAGNOSIS — R296 Repeated falls: Secondary | ICD-10-CM | POA: Diagnosis not present

## 2019-04-27 DIAGNOSIS — Z299 Encounter for prophylactic measures, unspecified: Secondary | ICD-10-CM | POA: Diagnosis not present

## 2019-04-29 DIAGNOSIS — K922 Gastrointestinal hemorrhage, unspecified: Secondary | ICD-10-CM | POA: Diagnosis not present

## 2019-04-29 DIAGNOSIS — Z20822 Contact with and (suspected) exposure to covid-19: Secondary | ICD-10-CM | POA: Diagnosis not present

## 2019-04-29 DIAGNOSIS — K625 Hemorrhage of anus and rectum: Secondary | ICD-10-CM | POA: Diagnosis not present

## 2019-04-29 DIAGNOSIS — R5381 Other malaise: Secondary | ICD-10-CM | POA: Diagnosis not present

## 2019-04-29 DIAGNOSIS — R0902 Hypoxemia: Secondary | ICD-10-CM | POA: Diagnosis not present

## 2019-04-29 DIAGNOSIS — R58 Hemorrhage, not elsewhere classified: Secondary | ICD-10-CM | POA: Diagnosis not present

## 2019-04-30 DIAGNOSIS — Z952 Presence of prosthetic heart valve: Secondary | ICD-10-CM | POA: Diagnosis not present

## 2019-04-30 DIAGNOSIS — K625 Hemorrhage of anus and rectum: Secondary | ICD-10-CM | POA: Diagnosis not present

## 2019-04-30 DIAGNOSIS — Z882 Allergy status to sulfonamides status: Secondary | ICD-10-CM | POA: Diagnosis not present

## 2019-04-30 DIAGNOSIS — Z880 Allergy status to penicillin: Secondary | ICD-10-CM | POA: Diagnosis not present

## 2019-04-30 DIAGNOSIS — Z20822 Contact with and (suspected) exposure to covid-19: Secondary | ICD-10-CM | POA: Diagnosis present

## 2019-04-30 DIAGNOSIS — F329 Major depressive disorder, single episode, unspecified: Secondary | ICD-10-CM | POA: Diagnosis present

## 2019-04-30 DIAGNOSIS — I10 Essential (primary) hypertension: Secondary | ICD-10-CM | POA: Diagnosis present

## 2019-04-30 DIAGNOSIS — K51311 Ulcerative (chronic) rectosigmoiditis with rectal bleeding: Secondary | ICD-10-CM | POA: Diagnosis present

## 2019-04-30 DIAGNOSIS — R296 Repeated falls: Secondary | ICD-10-CM | POA: Diagnosis present

## 2019-04-30 DIAGNOSIS — E039 Hypothyroidism, unspecified: Secondary | ICD-10-CM | POA: Diagnosis present

## 2019-05-05 DIAGNOSIS — I10 Essential (primary) hypertension: Secondary | ICD-10-CM | POA: Diagnosis not present

## 2019-05-05 DIAGNOSIS — I4891 Unspecified atrial fibrillation: Secondary | ICD-10-CM | POA: Diagnosis not present

## 2019-05-05 DIAGNOSIS — Z299 Encounter for prophylactic measures, unspecified: Secondary | ICD-10-CM | POA: Diagnosis not present

## 2019-05-05 DIAGNOSIS — G309 Alzheimer's disease, unspecified: Secondary | ICD-10-CM | POA: Diagnosis not present

## 2019-05-05 DIAGNOSIS — K922 Gastrointestinal hemorrhage, unspecified: Secondary | ICD-10-CM | POA: Diagnosis not present

## 2019-05-05 DIAGNOSIS — F0280 Dementia in other diseases classified elsewhere without behavioral disturbance: Secondary | ICD-10-CM | POA: Diagnosis not present

## 2019-05-09 DIAGNOSIS — R0602 Shortness of breath: Secondary | ICD-10-CM | POA: Diagnosis not present

## 2019-06-16 DIAGNOSIS — I4891 Unspecified atrial fibrillation: Secondary | ICD-10-CM | POA: Diagnosis not present

## 2019-06-16 DIAGNOSIS — I10 Essential (primary) hypertension: Secondary | ICD-10-CM | POA: Diagnosis not present

## 2019-06-16 DIAGNOSIS — K219 Gastro-esophageal reflux disease without esophagitis: Secondary | ICD-10-CM | POA: Diagnosis not present

## 2019-06-16 DIAGNOSIS — Z299 Encounter for prophylactic measures, unspecified: Secondary | ICD-10-CM | POA: Diagnosis not present

## 2019-06-16 DIAGNOSIS — R42 Dizziness and giddiness: Secondary | ICD-10-CM | POA: Diagnosis not present

## 2019-06-16 DIAGNOSIS — F0390 Unspecified dementia without behavioral disturbance: Secondary | ICD-10-CM | POA: Diagnosis not present

## 2019-06-28 DIAGNOSIS — H353221 Exudative age-related macular degeneration, left eye, with active choroidal neovascularization: Secondary | ICD-10-CM | POA: Diagnosis not present

## 2019-06-28 DIAGNOSIS — H353112 Nonexudative age-related macular degeneration, right eye, intermediate dry stage: Secondary | ICD-10-CM | POA: Diagnosis not present

## 2019-07-06 DIAGNOSIS — I4891 Unspecified atrial fibrillation: Secondary | ICD-10-CM | POA: Diagnosis not present

## 2019-07-06 DIAGNOSIS — M159 Polyosteoarthritis, unspecified: Secondary | ICD-10-CM | POA: Diagnosis not present

## 2019-07-06 DIAGNOSIS — I10 Essential (primary) hypertension: Secondary | ICD-10-CM | POA: Diagnosis not present

## 2019-07-06 DIAGNOSIS — M810 Age-related osteoporosis without current pathological fracture: Secondary | ICD-10-CM | POA: Diagnosis not present

## 2019-08-05 DIAGNOSIS — I10 Essential (primary) hypertension: Secondary | ICD-10-CM | POA: Diagnosis not present

## 2019-08-15 DIAGNOSIS — H353221 Exudative age-related macular degeneration, left eye, with active choroidal neovascularization: Secondary | ICD-10-CM | POA: Diagnosis not present

## 2019-09-01 DIAGNOSIS — I10 Essential (primary) hypertension: Secondary | ICD-10-CM | POA: Diagnosis not present

## 2019-09-01 DIAGNOSIS — M159 Polyosteoarthritis, unspecified: Secondary | ICD-10-CM | POA: Diagnosis not present

## 2019-09-01 DIAGNOSIS — I4891 Unspecified atrial fibrillation: Secondary | ICD-10-CM | POA: Diagnosis not present

## 2019-09-01 DIAGNOSIS — M810 Age-related osteoporosis without current pathological fracture: Secondary | ICD-10-CM | POA: Diagnosis not present

## 2019-09-05 DIAGNOSIS — Z713 Dietary counseling and surveillance: Secondary | ICD-10-CM | POA: Diagnosis not present

## 2019-09-05 DIAGNOSIS — Z0000 Encounter for general adult medical examination without abnormal findings: Secondary | ICD-10-CM | POA: Diagnosis not present

## 2019-09-05 DIAGNOSIS — F0390 Unspecified dementia without behavioral disturbance: Secondary | ICD-10-CM | POA: Diagnosis not present

## 2019-09-05 DIAGNOSIS — Z1331 Encounter for screening for depression: Secondary | ICD-10-CM | POA: Diagnosis not present

## 2019-09-05 DIAGNOSIS — I4891 Unspecified atrial fibrillation: Secondary | ICD-10-CM | POA: Diagnosis not present

## 2019-09-05 DIAGNOSIS — Z299 Encounter for prophylactic measures, unspecified: Secondary | ICD-10-CM | POA: Diagnosis not present

## 2019-09-05 DIAGNOSIS — Z1339 Encounter for screening examination for other mental health and behavioral disorders: Secondary | ICD-10-CM | POA: Diagnosis not present

## 2019-09-05 DIAGNOSIS — Z79899 Other long term (current) drug therapy: Secondary | ICD-10-CM | POA: Diagnosis not present

## 2019-09-05 DIAGNOSIS — R5383 Other fatigue: Secondary | ICD-10-CM | POA: Diagnosis not present

## 2019-09-05 DIAGNOSIS — E039 Hypothyroidism, unspecified: Secondary | ICD-10-CM | POA: Diagnosis not present

## 2019-09-05 DIAGNOSIS — Z7189 Other specified counseling: Secondary | ICD-10-CM | POA: Diagnosis not present

## 2019-09-05 DIAGNOSIS — E7800 Pure hypercholesterolemia, unspecified: Secondary | ICD-10-CM | POA: Diagnosis not present

## 2019-09-05 DIAGNOSIS — I10 Essential (primary) hypertension: Secondary | ICD-10-CM | POA: Diagnosis not present

## 2019-09-06 DIAGNOSIS — I10 Essential (primary) hypertension: Secondary | ICD-10-CM | POA: Diagnosis not present

## 2019-09-13 DIAGNOSIS — E875 Hyperkalemia: Secondary | ICD-10-CM | POA: Diagnosis not present

## 2019-09-19 ENCOUNTER — Telehealth: Payer: Medicare Other | Admitting: Cardiology

## 2019-09-19 ENCOUNTER — Other Ambulatory Visit: Payer: Self-pay

## 2019-09-20 ENCOUNTER — Encounter: Payer: Self-pay | Admitting: Cardiology

## 2019-09-22 DIAGNOSIS — H353113 Nonexudative age-related macular degeneration, right eye, advanced atrophic without subfoveal involvement: Secondary | ICD-10-CM | POA: Diagnosis not present

## 2019-09-22 DIAGNOSIS — H353221 Exudative age-related macular degeneration, left eye, with active choroidal neovascularization: Secondary | ICD-10-CM | POA: Diagnosis not present

## 2019-09-30 DIAGNOSIS — E875 Hyperkalemia: Secondary | ICD-10-CM | POA: Diagnosis not present

## 2019-10-06 DIAGNOSIS — N3000 Acute cystitis without hematuria: Secondary | ICD-10-CM | POA: Diagnosis not present

## 2019-10-06 DIAGNOSIS — S02119A Unspecified fracture of occiput, initial encounter for closed fracture: Secondary | ICD-10-CM | POA: Diagnosis not present

## 2019-10-06 DIAGNOSIS — M159 Polyosteoarthritis, unspecified: Secondary | ICD-10-CM | POA: Diagnosis not present

## 2019-10-06 DIAGNOSIS — K862 Cyst of pancreas: Secondary | ICD-10-CM | POA: Diagnosis not present

## 2019-10-06 DIAGNOSIS — I4891 Unspecified atrial fibrillation: Secondary | ICD-10-CM | POA: Diagnosis not present

## 2019-10-06 DIAGNOSIS — S2231XA Fracture of one rib, right side, initial encounter for closed fracture: Secondary | ICD-10-CM | POA: Diagnosis not present

## 2019-10-06 DIAGNOSIS — S0211GA Other fracture of occiput, right side, initial encounter for closed fracture: Secondary | ICD-10-CM | POA: Diagnosis not present

## 2019-10-06 DIAGNOSIS — T1490XA Injury, unspecified, initial encounter: Secondary | ICD-10-CM | POA: Diagnosis not present

## 2019-10-06 DIAGNOSIS — R5383 Other fatigue: Secondary | ICD-10-CM | POA: Diagnosis not present

## 2019-10-06 DIAGNOSIS — G9608 Other cranial cerebrospinal fluid leak: Secondary | ICD-10-CM | POA: Diagnosis not present

## 2019-10-06 DIAGNOSIS — N281 Cyst of kidney, acquired: Secondary | ICD-10-CM | POA: Diagnosis not present

## 2019-10-06 DIAGNOSIS — S15009A Unspecified injury of unspecified carotid artery, initial encounter: Secondary | ICD-10-CM | POA: Diagnosis not present

## 2019-10-06 DIAGNOSIS — Z20822 Contact with and (suspected) exposure to covid-19: Secondary | ICD-10-CM | POA: Diagnosis not present

## 2019-10-06 DIAGNOSIS — R42 Dizziness and giddiness: Secondary | ICD-10-CM | POA: Diagnosis not present

## 2019-10-06 DIAGNOSIS — Z951 Presence of aortocoronary bypass graft: Secondary | ICD-10-CM | POA: Diagnosis not present

## 2019-10-06 DIAGNOSIS — S066X9A Traumatic subarachnoid hemorrhage with loss of consciousness of unspecified duration, initial encounter: Secondary | ICD-10-CM | POA: Diagnosis not present

## 2019-10-06 DIAGNOSIS — F322 Major depressive disorder, single episode, severe without psychotic features: Secondary | ICD-10-CM | POA: Diagnosis not present

## 2019-10-06 DIAGNOSIS — N390 Urinary tract infection, site not specified: Secondary | ICD-10-CM | POA: Diagnosis not present

## 2019-10-06 DIAGNOSIS — E871 Hypo-osmolality and hyponatremia: Secondary | ICD-10-CM | POA: Diagnosis not present

## 2019-10-06 DIAGNOSIS — M1991 Primary osteoarthritis, unspecified site: Secondary | ICD-10-CM | POA: Diagnosis not present

## 2019-10-06 DIAGNOSIS — I700 Atherosclerosis of aorta: Secondary | ICD-10-CM | POA: Diagnosis not present

## 2019-10-06 DIAGNOSIS — J811 Chronic pulmonary edema: Secondary | ICD-10-CM | POA: Diagnosis not present

## 2019-10-06 DIAGNOSIS — I10 Essential (primary) hypertension: Secondary | ICD-10-CM | POA: Diagnosis not present

## 2019-10-06 DIAGNOSIS — E860 Dehydration: Secondary | ICD-10-CM | POA: Diagnosis not present

## 2019-10-06 DIAGNOSIS — R109 Unspecified abdominal pain: Secondary | ICD-10-CM | POA: Diagnosis not present

## 2019-10-06 DIAGNOSIS — Z299 Encounter for prophylactic measures, unspecified: Secondary | ICD-10-CM | POA: Diagnosis not present

## 2019-10-06 DIAGNOSIS — S199XXA Unspecified injury of neck, initial encounter: Secondary | ICD-10-CM | POA: Diagnosis not present

## 2019-10-06 DIAGNOSIS — M810 Age-related osteoporosis without current pathological fracture: Secondary | ICD-10-CM | POA: Diagnosis not present

## 2019-10-06 DIAGNOSIS — K439 Ventral hernia without obstruction or gangrene: Secondary | ICD-10-CM | POA: Diagnosis not present

## 2019-10-06 DIAGNOSIS — S0003XA Contusion of scalp, initial encounter: Secondary | ICD-10-CM | POA: Diagnosis not present

## 2019-10-07 DIAGNOSIS — E222 Syndrome of inappropriate secretion of antidiuretic hormone: Secondary | ICD-10-CM | POA: Diagnosis not present

## 2019-10-07 DIAGNOSIS — R5381 Other malaise: Secondary | ICD-10-CM | POA: Diagnosis not present

## 2019-10-07 DIAGNOSIS — S069X0S Unspecified intracranial injury without loss of consciousness, sequela: Secondary | ICD-10-CM | POA: Diagnosis not present

## 2019-10-07 DIAGNOSIS — M545 Low back pain, unspecified: Secondary | ICD-10-CM | POA: Diagnosis not present

## 2019-10-07 DIAGNOSIS — Z7401 Bed confinement status: Secondary | ICD-10-CM | POA: Diagnosis not present

## 2019-10-07 DIAGNOSIS — S065X9A Traumatic subdural hemorrhage with loss of consciousness of unspecified duration, initial encounter: Secondary | ICD-10-CM | POA: Diagnosis not present

## 2019-10-07 DIAGNOSIS — I082 Rheumatic disorders of both aortic and tricuspid valves: Secondary | ICD-10-CM | POA: Diagnosis not present

## 2019-10-07 DIAGNOSIS — Z9181 History of falling: Secondary | ICD-10-CM | POA: Diagnosis not present

## 2019-10-07 DIAGNOSIS — W19XXXS Unspecified fall, sequela: Secondary | ICD-10-CM | POA: Diagnosis not present

## 2019-10-07 DIAGNOSIS — S066X0D Traumatic subarachnoid hemorrhage without loss of consciousness, subsequent encounter: Secondary | ICD-10-CM | POA: Diagnosis not present

## 2019-10-07 DIAGNOSIS — M542 Cervicalgia: Secondary | ICD-10-CM | POA: Diagnosis not present

## 2019-10-07 DIAGNOSIS — S0219XA Other fracture of base of skull, initial encounter for closed fracture: Secondary | ICD-10-CM | POA: Diagnosis not present

## 2019-10-07 DIAGNOSIS — S06810A Injury of right internal carotid artery, intracranial portion, not elsewhere classified without loss of consciousness, initial encounter: Secondary | ICD-10-CM | POA: Diagnosis present

## 2019-10-07 DIAGNOSIS — S2241XD Multiple fractures of ribs, right side, subsequent encounter for fracture with routine healing: Secondary | ICD-10-CM | POA: Diagnosis not present

## 2019-10-07 DIAGNOSIS — I499 Cardiac arrhythmia, unspecified: Secondary | ICD-10-CM | POA: Diagnosis not present

## 2019-10-07 DIAGNOSIS — R1319 Other dysphagia: Secondary | ICD-10-CM | POA: Diagnosis not present

## 2019-10-07 DIAGNOSIS — Z20822 Contact with and (suspected) exposure to covid-19: Secondary | ICD-10-CM | POA: Diagnosis present

## 2019-10-07 DIAGNOSIS — W1830XA Fall on same level, unspecified, initial encounter: Secondary | ICD-10-CM | POA: Diagnosis not present

## 2019-10-07 DIAGNOSIS — E860 Dehydration: Secondary | ICD-10-CM | POA: Diagnosis not present

## 2019-10-07 DIAGNOSIS — T1490XA Injury, unspecified, initial encounter: Secondary | ICD-10-CM | POA: Diagnosis not present

## 2019-10-07 DIAGNOSIS — F329 Major depressive disorder, single episode, unspecified: Secondary | ICD-10-CM | POA: Diagnosis not present

## 2019-10-07 DIAGNOSIS — S22061A Stable burst fracture of T7-T8 vertebra, initial encounter for closed fracture: Secondary | ICD-10-CM | POA: Diagnosis not present

## 2019-10-07 DIAGNOSIS — G9608 Other cranial cerebrospinal fluid leak: Secondary | ICD-10-CM | POA: Diagnosis not present

## 2019-10-07 DIAGNOSIS — M1991 Primary osteoarthritis, unspecified site: Secondary | ICD-10-CM | POA: Diagnosis not present

## 2019-10-07 DIAGNOSIS — S02119D Unspecified fracture of occiput, subsequent encounter for fracture with routine healing: Secondary | ICD-10-CM | POA: Diagnosis not present

## 2019-10-07 DIAGNOSIS — S069X0A Unspecified intracranial injury without loss of consciousness, initial encounter: Secondary | ICD-10-CM | POA: Diagnosis not present

## 2019-10-07 DIAGNOSIS — G4700 Insomnia, unspecified: Secondary | ICD-10-CM | POA: Diagnosis not present

## 2019-10-07 DIAGNOSIS — S066X0A Traumatic subarachnoid hemorrhage without loss of consciousness, initial encounter: Secondary | ICD-10-CM | POA: Diagnosis not present

## 2019-10-07 DIAGNOSIS — R41841 Cognitive communication deficit: Secondary | ICD-10-CM | POA: Diagnosis not present

## 2019-10-07 DIAGNOSIS — E871 Hypo-osmolality and hyponatremia: Secondary | ICD-10-CM | POA: Diagnosis not present

## 2019-10-07 DIAGNOSIS — Z8679 Personal history of other diseases of the circulatory system: Secondary | ICD-10-CM | POA: Diagnosis not present

## 2019-10-07 DIAGNOSIS — R9431 Abnormal electrocardiogram [ECG] [EKG]: Secondary | ICD-10-CM | POA: Diagnosis not present

## 2019-10-07 DIAGNOSIS — F0781 Postconcussional syndrome: Secondary | ICD-10-CM | POA: Diagnosis not present

## 2019-10-07 DIAGNOSIS — R2689 Other abnormalities of gait and mobility: Secondary | ICD-10-CM | POA: Diagnosis not present

## 2019-10-07 DIAGNOSIS — S15002D Unspecified injury of left carotid artery, subsequent encounter: Secondary | ICD-10-CM | POA: Diagnosis not present

## 2019-10-07 DIAGNOSIS — I712 Thoracic aortic aneurysm, without rupture: Secondary | ICD-10-CM | POA: Diagnosis not present

## 2019-10-07 DIAGNOSIS — S2243XA Multiple fractures of ribs, bilateral, initial encounter for closed fracture: Secondary | ICD-10-CM | POA: Diagnosis present

## 2019-10-07 DIAGNOSIS — S066X9A Traumatic subarachnoid hemorrhage with loss of consciousness of unspecified duration, initial encounter: Secondary | ICD-10-CM | POA: Diagnosis not present

## 2019-10-07 DIAGNOSIS — I10 Essential (primary) hypertension: Secondary | ICD-10-CM | POA: Diagnosis not present

## 2019-10-07 DIAGNOSIS — S06820A Injury of left internal carotid artery, intracranial portion, not elsewhere classified without loss of consciousness, initial encounter: Secondary | ICD-10-CM | POA: Diagnosis present

## 2019-10-07 DIAGNOSIS — M546 Pain in thoracic spine: Secondary | ICD-10-CM | POA: Diagnosis not present

## 2019-10-07 DIAGNOSIS — M6281 Muscle weakness (generalized): Secondary | ICD-10-CM | POA: Diagnosis not present

## 2019-10-07 DIAGNOSIS — S02119A Unspecified fracture of occiput, initial encounter for closed fracture: Secondary | ICD-10-CM | POA: Diagnosis present

## 2019-10-07 DIAGNOSIS — S15009A Unspecified injury of unspecified carotid artery, initial encounter: Secondary | ICD-10-CM | POA: Diagnosis not present

## 2019-10-07 DIAGNOSIS — N390 Urinary tract infection, site not specified: Secondary | ICD-10-CM | POA: Diagnosis not present

## 2019-10-07 DIAGNOSIS — M2550 Pain in unspecified joint: Secondary | ICD-10-CM | POA: Diagnosis not present

## 2019-10-07 DIAGNOSIS — N3000 Acute cystitis without hematuria: Secondary | ICD-10-CM | POA: Diagnosis not present

## 2019-10-12 DIAGNOSIS — I10 Essential (primary) hypertension: Secondary | ICD-10-CM | POA: Diagnosis not present

## 2019-10-12 DIAGNOSIS — S15009A Unspecified injury of unspecified carotid artery, initial encounter: Secondary | ICD-10-CM | POA: Diagnosis not present

## 2019-10-12 DIAGNOSIS — R41841 Cognitive communication deficit: Secondary | ICD-10-CM | POA: Diagnosis not present

## 2019-10-12 DIAGNOSIS — R2689 Other abnormalities of gait and mobility: Secondary | ICD-10-CM | POA: Diagnosis not present

## 2019-10-12 DIAGNOSIS — Z7401 Bed confinement status: Secondary | ICD-10-CM | POA: Diagnosis not present

## 2019-10-12 DIAGNOSIS — S15002D Unspecified injury of left carotid artery, subsequent encounter: Secondary | ICD-10-CM | POA: Diagnosis not present

## 2019-10-12 DIAGNOSIS — S15009D Unspecified injury of unspecified carotid artery, subsequent encounter: Secondary | ICD-10-CM | POA: Diagnosis not present

## 2019-10-12 DIAGNOSIS — Z9181 History of falling: Secondary | ICD-10-CM | POA: Diagnosis not present

## 2019-10-12 DIAGNOSIS — R5381 Other malaise: Secondary | ICD-10-CM | POA: Diagnosis not present

## 2019-10-12 DIAGNOSIS — I773 Arterial fibromuscular dysplasia: Secondary | ICD-10-CM | POA: Diagnosis not present

## 2019-10-12 DIAGNOSIS — S2241XD Multiple fractures of ribs, right side, subsequent encounter for fracture with routine healing: Secondary | ICD-10-CM | POA: Diagnosis not present

## 2019-10-12 DIAGNOSIS — D649 Anemia, unspecified: Secondary | ICD-10-CM | POA: Diagnosis not present

## 2019-10-12 DIAGNOSIS — M2550 Pain in unspecified joint: Secondary | ICD-10-CM | POA: Diagnosis not present

## 2019-10-12 DIAGNOSIS — M159 Polyosteoarthritis, unspecified: Secondary | ICD-10-CM | POA: Diagnosis not present

## 2019-10-12 DIAGNOSIS — M810 Age-related osteoporosis without current pathological fracture: Secondary | ICD-10-CM | POA: Diagnosis not present

## 2019-10-12 DIAGNOSIS — F329 Major depressive disorder, single episode, unspecified: Secondary | ICD-10-CM | POA: Diagnosis not present

## 2019-10-12 DIAGNOSIS — G309 Alzheimer's disease, unspecified: Secondary | ICD-10-CM | POA: Diagnosis not present

## 2019-10-12 DIAGNOSIS — Z7982 Long term (current) use of aspirin: Secondary | ICD-10-CM | POA: Diagnosis not present

## 2019-10-12 DIAGNOSIS — R410 Disorientation, unspecified: Secondary | ICD-10-CM | POA: Diagnosis not present

## 2019-10-12 DIAGNOSIS — S02119A Unspecified fracture of occiput, initial encounter for closed fracture: Secondary | ICD-10-CM | POA: Diagnosis not present

## 2019-10-12 DIAGNOSIS — H6121 Impacted cerumen, right ear: Secondary | ICD-10-CM | POA: Diagnosis not present

## 2019-10-12 DIAGNOSIS — Z299 Encounter for prophylactic measures, unspecified: Secondary | ICD-10-CM | POA: Diagnosis not present

## 2019-10-12 DIAGNOSIS — F0280 Dementia in other diseases classified elsewhere without behavioral disturbance: Secondary | ICD-10-CM | POA: Diagnosis not present

## 2019-10-12 DIAGNOSIS — R1319 Other dysphagia: Secondary | ICD-10-CM | POA: Diagnosis not present

## 2019-10-12 DIAGNOSIS — I609 Nontraumatic subarachnoid hemorrhage, unspecified: Secondary | ICD-10-CM | POA: Diagnosis not present

## 2019-10-12 DIAGNOSIS — S066X0D Traumatic subarachnoid hemorrhage without loss of consciousness, subsequent encounter: Secondary | ICD-10-CM | POA: Diagnosis not present

## 2019-10-12 DIAGNOSIS — I719 Aortic aneurysm of unspecified site, without rupture: Secondary | ICD-10-CM | POA: Diagnosis not present

## 2019-10-12 DIAGNOSIS — I4891 Unspecified atrial fibrillation: Secondary | ICD-10-CM | POA: Diagnosis not present

## 2019-10-12 DIAGNOSIS — F0390 Unspecified dementia without behavioral disturbance: Secondary | ICD-10-CM | POA: Diagnosis not present

## 2019-10-12 DIAGNOSIS — S065X9A Traumatic subdural hemorrhage with loss of consciousness of unspecified duration, initial encounter: Secondary | ICD-10-CM | POA: Diagnosis not present

## 2019-10-12 DIAGNOSIS — E871 Hypo-osmolality and hyponatremia: Secondary | ICD-10-CM | POA: Diagnosis not present

## 2019-10-12 DIAGNOSIS — G4700 Insomnia, unspecified: Secondary | ICD-10-CM | POA: Diagnosis not present

## 2019-10-12 DIAGNOSIS — T1490XA Injury, unspecified, initial encounter: Secondary | ICD-10-CM | POA: Diagnosis not present

## 2019-10-12 DIAGNOSIS — S066X9D Traumatic subarachnoid hemorrhage with loss of consciousness of unspecified duration, subsequent encounter: Secondary | ICD-10-CM | POA: Diagnosis not present

## 2019-10-12 DIAGNOSIS — M6281 Muscle weakness (generalized): Secondary | ICD-10-CM | POA: Diagnosis not present

## 2019-10-12 DIAGNOSIS — N390 Urinary tract infection, site not specified: Secondary | ICD-10-CM | POA: Diagnosis not present

## 2019-10-12 DIAGNOSIS — F322 Major depressive disorder, single episode, severe without psychotic features: Secondary | ICD-10-CM | POA: Diagnosis not present

## 2019-10-12 DIAGNOSIS — S02119D Unspecified fracture of occiput, subsequent encounter for fracture with routine healing: Secondary | ICD-10-CM | POA: Diagnosis not present

## 2019-10-12 DIAGNOSIS — W1830XA Fall on same level, unspecified, initial encounter: Secondary | ICD-10-CM | POA: Diagnosis not present

## 2019-10-13 DIAGNOSIS — I609 Nontraumatic subarachnoid hemorrhage, unspecified: Secondary | ICD-10-CM | POA: Diagnosis not present

## 2019-10-13 DIAGNOSIS — R410 Disorientation, unspecified: Secondary | ICD-10-CM | POA: Diagnosis not present

## 2019-10-13 DIAGNOSIS — F0390 Unspecified dementia without behavioral disturbance: Secondary | ICD-10-CM | POA: Diagnosis not present

## 2019-10-13 DIAGNOSIS — E871 Hypo-osmolality and hyponatremia: Secondary | ICD-10-CM | POA: Diagnosis not present

## 2019-10-18 DIAGNOSIS — Z299 Encounter for prophylactic measures, unspecified: Secondary | ICD-10-CM | POA: Diagnosis not present

## 2019-10-18 DIAGNOSIS — E871 Hypo-osmolality and hyponatremia: Secondary | ICD-10-CM | POA: Diagnosis not present

## 2019-10-18 DIAGNOSIS — I4891 Unspecified atrial fibrillation: Secondary | ICD-10-CM | POA: Diagnosis not present

## 2019-10-18 DIAGNOSIS — F0280 Dementia in other diseases classified elsewhere without behavioral disturbance: Secondary | ICD-10-CM | POA: Diagnosis not present

## 2019-10-18 DIAGNOSIS — G309 Alzheimer's disease, unspecified: Secondary | ICD-10-CM | POA: Diagnosis not present

## 2019-10-26 ENCOUNTER — Other Ambulatory Visit: Payer: Self-pay | Admitting: *Deleted

## 2019-10-26 NOTE — Patient Outreach (Signed)
Member screened for potential [facility] Care Management needs as a benefit of [employer].  Per Patient Ping member resides in [facility].   Communication sent to [facility] SW to collaborate about anticipated dc plans and potential [facility] Care Management needs.  Will continue to follow while member resides in SNF.   Jun Rightmyer, MSN-Ed, RN,BSN [facility] Post Acute Care Coordinator [phone] ( Business Mobile) [phone]  (Toll free office)  

## 2019-10-27 DIAGNOSIS — F322 Major depressive disorder, single episode, severe without psychotic features: Secondary | ICD-10-CM | POA: Diagnosis not present

## 2019-10-27 DIAGNOSIS — I719 Aortic aneurysm of unspecified site, without rupture: Secondary | ICD-10-CM | POA: Diagnosis not present

## 2019-10-27 DIAGNOSIS — D649 Anemia, unspecified: Secondary | ICD-10-CM | POA: Diagnosis not present

## 2019-10-27 DIAGNOSIS — I4891 Unspecified atrial fibrillation: Secondary | ICD-10-CM | POA: Diagnosis not present

## 2019-10-27 DIAGNOSIS — Z299 Encounter for prophylactic measures, unspecified: Secondary | ICD-10-CM | POA: Diagnosis not present

## 2019-11-01 ENCOUNTER — Other Ambulatory Visit: Payer: Self-pay | Admitting: *Deleted

## 2019-11-01 DIAGNOSIS — I4891 Unspecified atrial fibrillation: Secondary | ICD-10-CM | POA: Diagnosis not present

## 2019-11-01 DIAGNOSIS — I719 Aortic aneurysm of unspecified site, without rupture: Secondary | ICD-10-CM | POA: Diagnosis not present

## 2019-11-01 DIAGNOSIS — Z299 Encounter for prophylactic measures, unspecified: Secondary | ICD-10-CM | POA: Diagnosis not present

## 2019-11-01 DIAGNOSIS — D649 Anemia, unspecified: Secondary | ICD-10-CM | POA: Diagnosis not present

## 2019-11-01 DIAGNOSIS — I10 Essential (primary) hypertension: Secondary | ICD-10-CM | POA: Diagnosis not present

## 2019-11-01 NOTE — Patient Outreach (Signed)
[place] Coordinator follow up. Member screened for potential [facility] Care Management needs as a benefit of [place] Medicare.  [name] is receiving skilled therapy at [facility]. Previous update received from [place] indicates member's transition plan is to return home alone. Has supportive family.  Will continue to follow transition plans and for potential [facility] Care Management needs.   Lauren Rolling, MSN-Ed, RN,BSN [place] Acute Care Coordinator [phone] [facility]) [phone]  (Toll free office)

## 2019-11-08 DIAGNOSIS — F0280 Dementia in other diseases classified elsewhere without behavioral disturbance: Secondary | ICD-10-CM | POA: Diagnosis not present

## 2019-11-08 DIAGNOSIS — Z299 Encounter for prophylactic measures, unspecified: Secondary | ICD-10-CM | POA: Diagnosis not present

## 2019-11-08 DIAGNOSIS — G309 Alzheimer's disease, unspecified: Secondary | ICD-10-CM | POA: Diagnosis not present

## 2019-11-08 DIAGNOSIS — H6121 Impacted cerumen, right ear: Secondary | ICD-10-CM | POA: Diagnosis not present

## 2019-11-08 DIAGNOSIS — I719 Aortic aneurysm of unspecified site, without rupture: Secondary | ICD-10-CM | POA: Diagnosis not present

## 2019-11-10 DIAGNOSIS — S2241XD Multiple fractures of ribs, right side, subsequent encounter for fracture with routine healing: Secondary | ICD-10-CM | POA: Diagnosis not present

## 2019-11-10 DIAGNOSIS — I773 Arterial fibromuscular dysplasia: Secondary | ICD-10-CM | POA: Diagnosis not present

## 2019-11-10 DIAGNOSIS — W1830XA Fall on same level, unspecified, initial encounter: Secondary | ICD-10-CM | POA: Diagnosis not present

## 2019-11-10 DIAGNOSIS — I609 Nontraumatic subarachnoid hemorrhage, unspecified: Secondary | ICD-10-CM | POA: Diagnosis not present

## 2019-11-10 DIAGNOSIS — S066X9D Traumatic subarachnoid hemorrhage with loss of consciousness of unspecified duration, subsequent encounter: Secondary | ICD-10-CM | POA: Diagnosis not present

## 2019-11-10 DIAGNOSIS — Z7982 Long term (current) use of aspirin: Secondary | ICD-10-CM | POA: Diagnosis not present

## 2019-11-10 DIAGNOSIS — S15009A Unspecified injury of unspecified carotid artery, initial encounter: Secondary | ICD-10-CM | POA: Diagnosis not present

## 2019-11-10 DIAGNOSIS — S15009D Unspecified injury of unspecified carotid artery, subsequent encounter: Secondary | ICD-10-CM | POA: Diagnosis not present

## 2019-11-14 ENCOUNTER — Other Ambulatory Visit: Payer: Self-pay | Admitting: *Deleted

## 2019-11-14 NOTE — Patient Outreach (Signed)
[facility] Post- Acute Care Coordinator follow up. Member screened for potential [facility] Care Management needs as a benefit of [place] Medicare.  Verified in Patient Lauren Morrow that member resides in [facility].   Communication sent to SNF SW to inquire about transition plans and potential [facility] Care Management needs.   Will continue to follow while member resides in SNF.    Marthenia Rolling, MSN-Ed, RN,BSN [place] Acute Care Coordinator [phone] [facility])

## 2019-11-15 ENCOUNTER — Other Ambulatory Visit: Payer: Self-pay | Admitting: *Deleted

## 2019-11-15 NOTE — Patient Outreach (Signed)
[facility] Post- Acute Care Coordinator follow up. Member screened for potential [facility] Care Management needs as a benefit of [place] Medicare.  Update received from [place] indicating transition plan is to return home. She has very supportive sisters.  Will continue to follow for potential [facility] Care Management needs while member resides in SNF.    Lauren Rolling, MSN-Ed, RN,BSN [place] Acute Care Coordinator [phone] [facility]) [phone]  (Toll free office)

## 2019-12-02 DIAGNOSIS — G309 Alzheimer's disease, unspecified: Secondary | ICD-10-CM | POA: Diagnosis not present

## 2019-12-02 DIAGNOSIS — N390 Urinary tract infection, site not specified: Secondary | ICD-10-CM | POA: Diagnosis not present

## 2019-12-02 DIAGNOSIS — I4891 Unspecified atrial fibrillation: Secondary | ICD-10-CM | POA: Diagnosis not present

## 2019-12-02 DIAGNOSIS — F0280 Dementia in other diseases classified elsewhere without behavioral disturbance: Secondary | ICD-10-CM | POA: Diagnosis not present

## 2019-12-05 ENCOUNTER — Other Ambulatory Visit: Payer: Self-pay | Admitting: *Deleted

## 2019-12-05 NOTE — Patient Outreach (Signed)
[facility] Post- Acute Care Coordinator follow up. Member screened for potential [facility] Care Management needs as a benefit of [place] Medicare.  Per Patient Lauren Morrow member resides in [facility].  Communication sent to [facility] SNF SWs to request update on anticipated transition plans and potential [facility] Care Management needs.   Marthenia Rolling, MSN, RN,BSN [place] Acute Care Coordinator [phone] [facility]) [phone]  (Toll free office)

## 2019-12-06 DIAGNOSIS — Z299 Encounter for prophylactic measures, unspecified: Secondary | ICD-10-CM | POA: Diagnosis not present

## 2019-12-06 DIAGNOSIS — I10 Essential (primary) hypertension: Secondary | ICD-10-CM | POA: Diagnosis not present

## 2019-12-06 DIAGNOSIS — F0280 Dementia in other diseases classified elsewhere without behavioral disturbance: Secondary | ICD-10-CM | POA: Diagnosis not present

## 2019-12-06 DIAGNOSIS — M810 Age-related osteoporosis without current pathological fracture: Secondary | ICD-10-CM | POA: Diagnosis not present

## 2019-12-06 DIAGNOSIS — I719 Aortic aneurysm of unspecified site, without rupture: Secondary | ICD-10-CM | POA: Diagnosis not present

## 2019-12-06 DIAGNOSIS — I4891 Unspecified atrial fibrillation: Secondary | ICD-10-CM | POA: Diagnosis not present

## 2019-12-06 DIAGNOSIS — M159 Polyosteoarthritis, unspecified: Secondary | ICD-10-CM | POA: Diagnosis not present

## 2019-12-06 DIAGNOSIS — G309 Alzheimer's disease, unspecified: Secondary | ICD-10-CM | POA: Diagnosis not present

## 2019-12-07 DIAGNOSIS — I10 Essential (primary) hypertension: Secondary | ICD-10-CM | POA: Diagnosis not present

## 2019-12-07 DIAGNOSIS — S2241XD Multiple fractures of ribs, right side, subsequent encounter for fracture with routine healing: Secondary | ICD-10-CM | POA: Diagnosis not present

## 2019-12-07 DIAGNOSIS — W19XXXD Unspecified fall, subsequent encounter: Secondary | ICD-10-CM | POA: Diagnosis not present

## 2019-12-07 DIAGNOSIS — G309 Alzheimer's disease, unspecified: Secondary | ICD-10-CM | POA: Diagnosis not present

## 2019-12-07 DIAGNOSIS — M810 Age-related osteoporosis without current pathological fracture: Secondary | ICD-10-CM | POA: Diagnosis not present

## 2019-12-07 DIAGNOSIS — E871 Hypo-osmolality and hyponatremia: Secondary | ICD-10-CM | POA: Diagnosis not present

## 2019-12-07 DIAGNOSIS — D649 Anemia, unspecified: Secondary | ICD-10-CM | POA: Diagnosis not present

## 2019-12-07 DIAGNOSIS — R32 Unspecified urinary incontinence: Secondary | ICD-10-CM | POA: Diagnosis not present

## 2019-12-07 DIAGNOSIS — S15009D Unspecified injury of unspecified carotid artery, subsequent encounter: Secondary | ICD-10-CM | POA: Diagnosis not present

## 2019-12-07 DIAGNOSIS — F322 Major depressive disorder, single episode, severe without psychotic features: Secondary | ICD-10-CM | POA: Diagnosis not present

## 2019-12-07 DIAGNOSIS — Z9181 History of falling: Secondary | ICD-10-CM | POA: Diagnosis not present

## 2019-12-07 DIAGNOSIS — S066X0D Traumatic subarachnoid hemorrhage without loss of consciousness, subsequent encounter: Secondary | ICD-10-CM | POA: Diagnosis not present

## 2019-12-07 DIAGNOSIS — E7800 Pure hypercholesterolemia, unspecified: Secondary | ICD-10-CM | POA: Diagnosis not present

## 2019-12-07 DIAGNOSIS — I4891 Unspecified atrial fibrillation: Secondary | ICD-10-CM | POA: Diagnosis not present

## 2019-12-07 DIAGNOSIS — I719 Aortic aneurysm of unspecified site, without rupture: Secondary | ICD-10-CM | POA: Diagnosis not present

## 2019-12-07 DIAGNOSIS — F0280 Dementia in other diseases classified elsewhere without behavioral disturbance: Secondary | ICD-10-CM | POA: Diagnosis not present

## 2019-12-07 DIAGNOSIS — S02119D Unspecified fracture of occiput, subsequent encounter for fracture with routine healing: Secondary | ICD-10-CM | POA: Diagnosis not present

## 2019-12-07 DIAGNOSIS — Z952 Presence of prosthetic heart valve: Secondary | ICD-10-CM | POA: Diagnosis not present

## 2019-12-08 DIAGNOSIS — S02119D Unspecified fracture of occiput, subsequent encounter for fracture with routine healing: Secondary | ICD-10-CM | POA: Diagnosis not present

## 2019-12-08 DIAGNOSIS — W19XXXD Unspecified fall, subsequent encounter: Secondary | ICD-10-CM | POA: Diagnosis not present

## 2019-12-08 DIAGNOSIS — S2241XD Multiple fractures of ribs, right side, subsequent encounter for fracture with routine healing: Secondary | ICD-10-CM | POA: Diagnosis not present

## 2019-12-08 DIAGNOSIS — S15009D Unspecified injury of unspecified carotid artery, subsequent encounter: Secondary | ICD-10-CM | POA: Diagnosis not present

## 2019-12-08 DIAGNOSIS — G309 Alzheimer's disease, unspecified: Secondary | ICD-10-CM | POA: Diagnosis not present

## 2019-12-08 DIAGNOSIS — S066X0D Traumatic subarachnoid hemorrhage without loss of consciousness, subsequent encounter: Secondary | ICD-10-CM | POA: Diagnosis not present

## 2019-12-09 DIAGNOSIS — S2241XD Multiple fractures of ribs, right side, subsequent encounter for fracture with routine healing: Secondary | ICD-10-CM | POA: Diagnosis not present

## 2019-12-09 DIAGNOSIS — S066X0D Traumatic subarachnoid hemorrhage without loss of consciousness, subsequent encounter: Secondary | ICD-10-CM | POA: Diagnosis not present

## 2019-12-09 DIAGNOSIS — S02119D Unspecified fracture of occiput, subsequent encounter for fracture with routine healing: Secondary | ICD-10-CM | POA: Diagnosis not present

## 2019-12-09 DIAGNOSIS — S15009D Unspecified injury of unspecified carotid artery, subsequent encounter: Secondary | ICD-10-CM | POA: Diagnosis not present

## 2019-12-09 DIAGNOSIS — G309 Alzheimer's disease, unspecified: Secondary | ICD-10-CM | POA: Diagnosis not present

## 2019-12-09 DIAGNOSIS — W19XXXD Unspecified fall, subsequent encounter: Secondary | ICD-10-CM | POA: Diagnosis not present

## 2019-12-12 DIAGNOSIS — S066X0D Traumatic subarachnoid hemorrhage without loss of consciousness, subsequent encounter: Secondary | ICD-10-CM | POA: Diagnosis not present

## 2019-12-12 DIAGNOSIS — S2241XD Multiple fractures of ribs, right side, subsequent encounter for fracture with routine healing: Secondary | ICD-10-CM | POA: Diagnosis not present

## 2019-12-12 DIAGNOSIS — S02119D Unspecified fracture of occiput, subsequent encounter for fracture with routine healing: Secondary | ICD-10-CM | POA: Diagnosis not present

## 2019-12-12 DIAGNOSIS — S15009D Unspecified injury of unspecified carotid artery, subsequent encounter: Secondary | ICD-10-CM | POA: Diagnosis not present

## 2019-12-12 DIAGNOSIS — G309 Alzheimer's disease, unspecified: Secondary | ICD-10-CM | POA: Diagnosis not present

## 2019-12-12 DIAGNOSIS — W19XXXD Unspecified fall, subsequent encounter: Secondary | ICD-10-CM | POA: Diagnosis not present

## 2019-12-14 DIAGNOSIS — I719 Aortic aneurysm of unspecified site, without rupture: Secondary | ICD-10-CM | POA: Diagnosis not present

## 2019-12-14 DIAGNOSIS — S2241XD Multiple fractures of ribs, right side, subsequent encounter for fracture with routine healing: Secondary | ICD-10-CM | POA: Diagnosis not present

## 2019-12-14 DIAGNOSIS — G309 Alzheimer's disease, unspecified: Secondary | ICD-10-CM | POA: Diagnosis not present

## 2019-12-14 DIAGNOSIS — Z09 Encounter for follow-up examination after completed treatment for conditions other than malignant neoplasm: Secondary | ICD-10-CM | POA: Diagnosis not present

## 2019-12-14 DIAGNOSIS — S15009D Unspecified injury of unspecified carotid artery, subsequent encounter: Secondary | ICD-10-CM | POA: Diagnosis not present

## 2019-12-14 DIAGNOSIS — S02119D Unspecified fracture of occiput, subsequent encounter for fracture with routine healing: Secondary | ICD-10-CM | POA: Diagnosis not present

## 2019-12-14 DIAGNOSIS — W19XXXD Unspecified fall, subsequent encounter: Secondary | ICD-10-CM | POA: Diagnosis not present

## 2019-12-14 DIAGNOSIS — S066X0D Traumatic subarachnoid hemorrhage without loss of consciousness, subsequent encounter: Secondary | ICD-10-CM | POA: Diagnosis not present

## 2019-12-14 DIAGNOSIS — I609 Nontraumatic subarachnoid hemorrhage, unspecified: Secondary | ICD-10-CM | POA: Diagnosis not present

## 2019-12-14 DIAGNOSIS — F0280 Dementia in other diseases classified elsewhere without behavioral disturbance: Secondary | ICD-10-CM | POA: Diagnosis not present

## 2019-12-14 DIAGNOSIS — I10 Essential (primary) hypertension: Secondary | ICD-10-CM | POA: Diagnosis not present

## 2019-12-15 ENCOUNTER — Emergency Department (HOSPITAL_COMMUNITY): Payer: Medicare Other

## 2019-12-15 ENCOUNTER — Encounter (HOSPITAL_COMMUNITY): Payer: Self-pay

## 2019-12-15 ENCOUNTER — Inpatient Hospital Stay (HOSPITAL_COMMUNITY)
Admission: EM | Admit: 2019-12-15 | Discharge: 2019-12-18 | DRG: 378 | Disposition: A | Discharge status: Home Health | Payer: Medicare Other | Attending: Family Medicine | Admitting: Family Medicine

## 2019-12-15 ENCOUNTER — Other Ambulatory Visit: Payer: Self-pay

## 2019-12-15 DIAGNOSIS — I10 Essential (primary) hypertension: Secondary | ICD-10-CM | POA: Diagnosis not present

## 2019-12-15 DIAGNOSIS — K254 Chronic or unspecified gastric ulcer with hemorrhage: Secondary | ICD-10-CM | POA: Diagnosis not present

## 2019-12-15 DIAGNOSIS — I4891 Unspecified atrial fibrillation: Secondary | ICD-10-CM | POA: Diagnosis not present

## 2019-12-15 DIAGNOSIS — Z7901 Long term (current) use of anticoagulants: Secondary | ICD-10-CM

## 2019-12-15 DIAGNOSIS — Z8679 Personal history of other diseases of the circulatory system: Secondary | ICD-10-CM

## 2019-12-15 DIAGNOSIS — Z7982 Long term (current) use of aspirin: Secondary | ICD-10-CM | POA: Diagnosis not present

## 2019-12-15 DIAGNOSIS — R1084 Generalized abdominal pain: Secondary | ICD-10-CM | POA: Diagnosis not present

## 2019-12-15 DIAGNOSIS — Z20822 Contact with and (suspected) exposure to covid-19: Secondary | ICD-10-CM | POA: Diagnosis present

## 2019-12-15 DIAGNOSIS — Z79899 Other long term (current) drug therapy: Secondary | ICD-10-CM

## 2019-12-15 DIAGNOSIS — K219 Gastro-esophageal reflux disease without esophagitis: Secondary | ICD-10-CM | POA: Diagnosis not present

## 2019-12-15 DIAGNOSIS — I739 Peripheral vascular disease, unspecified: Secondary | ICD-10-CM | POA: Diagnosis present

## 2019-12-15 DIAGNOSIS — Z9889 Other specified postprocedural states: Secondary | ICD-10-CM

## 2019-12-15 DIAGNOSIS — D62 Acute posthemorrhagic anemia: Secondary | ICD-10-CM | POA: Diagnosis not present

## 2019-12-15 DIAGNOSIS — F419 Anxiety disorder, unspecified: Secondary | ICD-10-CM | POA: Diagnosis present

## 2019-12-15 DIAGNOSIS — Z952 Presence of prosthetic heart valve: Secondary | ICD-10-CM | POA: Diagnosis not present

## 2019-12-15 DIAGNOSIS — K2970 Gastritis, unspecified, without bleeding: Secondary | ICD-10-CM | POA: Diagnosis not present

## 2019-12-15 DIAGNOSIS — R195 Other fecal abnormalities: Secondary | ICD-10-CM | POA: Diagnosis not present

## 2019-12-15 DIAGNOSIS — K922 Gastrointestinal hemorrhage, unspecified: Secondary | ICD-10-CM

## 2019-12-15 DIAGNOSIS — K625 Hemorrhage of anus and rectum: Secondary | ICD-10-CM

## 2019-12-15 DIAGNOSIS — Z66 Do not resuscitate: Secondary | ICD-10-CM | POA: Diagnosis present

## 2019-12-15 DIAGNOSIS — I350 Nonrheumatic aortic (valve) stenosis: Secondary | ICD-10-CM

## 2019-12-15 DIAGNOSIS — K921 Melena: Secondary | ICD-10-CM | POA: Diagnosis not present

## 2019-12-15 DIAGNOSIS — D649 Anemia, unspecified: Secondary | ICD-10-CM | POA: Diagnosis not present

## 2019-12-15 DIAGNOSIS — Z79890 Hormone replacement therapy (postmenopausal): Secondary | ICD-10-CM

## 2019-12-15 DIAGNOSIS — K3189 Other diseases of stomach and duodenum: Secondary | ICD-10-CM | POA: Diagnosis not present

## 2019-12-15 LAB — CBC WITH DIFFERENTIAL/PLATELET
Abs Immature Granulocytes: 0.05 10*3/uL (ref 0.00–0.07)
Basophils Absolute: 0.1 10*3/uL (ref 0.0–0.1)
Basophils Relative: 1 %
Eosinophils Absolute: 0.2 10*3/uL (ref 0.0–0.5)
Eosinophils Relative: 2 %
HCT: 33.7 % — ABNORMAL LOW (ref 36.0–46.0)
Hemoglobin: 9.9 g/dL — ABNORMAL LOW (ref 12.0–15.0)
Immature Granulocytes: 1 %
Lymphocytes Relative: 24 %
Lymphs Abs: 2.1 10*3/uL (ref 0.7–4.0)
MCH: 29.3 pg (ref 26.0–34.0)
MCHC: 29.4 g/dL — ABNORMAL LOW (ref 30.0–36.0)
MCV: 99.7 fL (ref 80.0–100.0)
Monocytes Absolute: 0.9 10*3/uL (ref 0.1–1.0)
Monocytes Relative: 11 %
Neutro Abs: 5.3 10*3/uL (ref 1.7–7.7)
Neutrophils Relative %: 61 %
Platelets: 271 10*3/uL (ref 150–400)
RBC: 3.38 MIL/uL — ABNORMAL LOW (ref 3.87–5.11)
RDW: 12.4 % (ref 11.5–15.5)
WBC: 8.6 10*3/uL (ref 4.0–10.5)
nRBC: 0 % (ref 0.0–0.2)

## 2019-12-15 LAB — COMPREHENSIVE METABOLIC PANEL
ALT: 11 U/L (ref 0–44)
AST: 15 U/L (ref 15–41)
Albumin: 3.5 g/dL (ref 3.5–5.0)
Alkaline Phosphatase: 52 U/L (ref 38–126)
Anion gap: 7 (ref 5–15)
BUN: 16 mg/dL (ref 8–23)
CO2: 28 mmol/L (ref 22–32)
Calcium: 8.6 mg/dL — ABNORMAL LOW (ref 8.9–10.3)
Chloride: 106 mmol/L (ref 98–111)
Creatinine, Ser: 0.62 mg/dL (ref 0.44–1.00)
GFR, Estimated: >60 mL/min (ref >60)
Glucose, Bld: 102 mg/dL — ABNORMAL HIGH (ref 70–99)
Potassium: 3.9 mmol/L (ref 3.5–5.1)
Sodium: 141 mmol/L (ref 135–145)
Total Bilirubin: 0.4 mg/dL (ref 0.3–1.2)
Total Protein: 6.4 g/dL — ABNORMAL LOW (ref 6.5–8.1)

## 2019-12-15 LAB — HEMOGLOBIN AND HEMATOCRIT, BLOOD
HCT: 27.6 % — ABNORMAL LOW (ref 36.0–46.0)
Hemoglobin: 8.3 g/dL — ABNORMAL LOW (ref 12.0–15.0)

## 2019-12-15 LAB — LIPASE, BLOOD: Lipase: 24 U/L (ref 11–51)

## 2019-12-15 LAB — RESP PANEL BY RT-PCR (FLU A&B, COVID) ARPGX2
Influenza A by PCR: NEGATIVE
Influenza B by PCR: NEGATIVE
SARS Coronavirus 2 by RT PCR: NEGATIVE

## 2019-12-15 LAB — TYPE AND SCREEN
ABO/RH(D): A POS
Antibody Screen: NEGATIVE

## 2019-12-15 LAB — POC OCCULT BLOOD, ED: Fecal Occult Bld: POSITIVE — AB

## 2019-12-15 MED ORDER — OXYCODONE HCL 5 MG PO TABS: 5.0000 mg | ORAL_TABLET | ORAL | Status: DC | PRN

## 2019-12-15 MED ORDER — ACETAMINOPHEN 325 MG PO TABS: 650.0000 mg | ORAL_TABLET | Freq: Four times a day (QID) | ORAL | Status: DC | PRN

## 2019-12-15 MED ORDER — IRBESARTAN 75 MG PO TABS
75.0000 mg | ORAL_TABLET | Freq: Every day | ORAL | Status: DC
  Administered 2019-12-15 – 2019-12-18 (×4): 75 mg via ORAL
  Filled 2019-12-15 (×5): qty 1

## 2019-12-15 MED ORDER — SODIUM CHLORIDE 1 G PO TABS: 1.0000 g | ORAL_TABLET | Freq: Three times a day (TID) | ORAL | Status: DC

## 2019-12-15 MED ORDER — SODIUM CHLORIDE 0.9 % IV SOLN: INTRAVENOUS | Status: DC

## 2019-12-15 MED ORDER — METOPROLOL TARTRATE 25 MG PO TABS
25.0000 mg | ORAL_TABLET | Freq: Two times a day (BID) | ORAL | Status: DC
  Administered 2019-12-15 – 2019-12-18 (×6): 25 mg via ORAL
  Filled 2019-12-15 (×7): qty 1

## 2019-12-15 MED ORDER — PANTOPRAZOLE SODIUM 40 MG IV SOLR
40.0000 mg | INTRAVENOUS | Status: DC
  Administered 2019-12-15: 40 mg via INTRAVENOUS
  Filled 2019-12-15: qty 40

## 2019-12-15 MED ORDER — MORPHINE SULFATE (PF) 2 MG/ML IV SOLN: 2.0000 mg | Freq: Once | INTRAVENOUS | Status: DC

## 2019-12-15 MED ORDER — ONDANSETRON HCL 4 MG PO TABS: 4.0000 mg | ORAL_TABLET | Freq: Four times a day (QID) | ORAL | Status: DC | PRN

## 2019-12-15 MED ORDER — ONDANSETRON HCL 4 MG/2ML IJ SOLN: 4.0000 mg | Freq: Four times a day (QID) | INTRAMUSCULAR | Status: DC | PRN

## 2019-12-15 MED ORDER — SERTRALINE HCL 50 MG PO TABS
50.0000 mg | ORAL_TABLET | Freq: Every day | ORAL | Status: DC
  Administered 2019-12-15 – 2019-12-18 (×4): 50 mg via ORAL
  Filled 2019-12-15 (×4): qty 1

## 2019-12-15 MED ORDER — RALOXIFENE HCL 60 MG PO TABS
60.0000 mg | ORAL_TABLET | Freq: Every day | ORAL | Status: DC
  Administered 2019-12-15 – 2019-12-18 (×4): 60 mg via ORAL
  Filled 2019-12-15 (×5): qty 1

## 2019-12-15 MED ORDER — ACETAMINOPHEN 650 MG RE SUPP: 650.0000 mg | Freq: Four times a day (QID) | RECTAL | Status: DC | PRN

## 2019-12-15 MED ORDER — METOPROLOL TARTRATE 5 MG/5ML IV SOLN: 5.0000 mg | Freq: Four times a day (QID) | INTRAVENOUS | Status: DC | PRN

## 2019-12-15 MED ORDER — LEVOTHYROXINE SODIUM 50 MCG PO TABS
50.0000 ug | ORAL_TABLET | Freq: Every day | ORAL | Status: DC
  Administered 2019-12-15 – 2019-12-18 (×3): 50 ug via ORAL
  Filled 2019-12-15 (×3): qty 1

## 2019-12-15 MED ORDER — SODIUM CHLORIDE 0.9 % IV BOLUS
500.0000 mL | Freq: Once | INTRAVENOUS | Status: AC
  Administered 2019-12-15: 500 mL via INTRAVENOUS

## 2019-12-15 MED ORDER — POLYETHYLENE GLYCOL 3350 17 G PO PACK: 17.0000 g | PACK | Freq: Every day | ORAL | Status: DC | PRN

## 2019-12-15 MED ORDER — TRAZODONE HCL 50 MG PO TABS: 25.0000 mg | ORAL_TABLET | Freq: Every evening | ORAL | Status: DC | PRN

## 2019-12-15 MED ORDER — IOHEXOL 300 MG/ML  SOLN
100.0000 mL | Freq: Once | INTRAMUSCULAR | Status: AC | PRN
  Administered 2019-12-15: 100 mL via INTRAVENOUS

## 2019-12-15 MED ORDER — HYDROMORPHONE HCL 1 MG/ML IJ SOLN: 0.2500 mg | INTRAMUSCULAR | Status: DC | PRN

## 2019-12-15 NOTE — H&P (Signed)
History and Physical  [facility]  Lauren Morrow WGN:[id number] DOB: 12-11-1924 DOA: 12/15/2019  PCP: Glenda Chroman, MD  Patient coming from: Home   I have personally briefly reviewed patient's old medical records in [place]  Chief Complaint: bright red rectal bleeding mixed with black stool  HPI: Lauren Morrow is a 86 y.o. female with medical history significant for remote history of postoperative atrial fibrillation has only been on full strength aspirin daily for anticoagulation since October 2021 and history of falls, aortic stenoss, s/p valvular replacement, PVD, GERD, HTN, dizziness reports that she started having generalized abdominal pain and bright red rectal bleeding yesterday where she described red blood mixed with black mushy stools.  She says that she had another incident happen earlier this morning around 4am with similar presentation of red blood mixed with her mushy stool.  She called her sister and her PCP and was advised to come to the ED for further evaluation and GI consultation.  She reported that she had a bout of GI bleeding about 1 year ago but since that time has had no issues up until yesterday.  She has been having dizziness for the past few days.  No recent falls.  No dysuria. No emesis.  No loss of appetite. No chest pain and no shortness of breath.    ED Course: Pt reports she did not take any of her meds today prior to arrival.  She was hypertensive on arrival with a BPo f 178/79, pulse 75, R 20, pulse ox 97% on room air.  She was hemoccult positive.  Hg was down to 9.9.  Her prior Hg was 14.  She had normal platelets and WBC.  Her CMP was within normal limits.  GI was consulted and recommended that she be admitted for further GI work up for her acute rectal bleed.   I confirmed with patient and her sister that she is DNR.    Review of Systems: As per HPI otherwise 10 point review of systems negative.    Past Medical History:  Diagnosis Date   . Anemia    hx of  . Anxiety   . Aortic stenosis    Dr. Rod Can, saw last 09/11/11  . Arthritis   . Atrial fibrillation ([place])    Post op, sees  Dr. Fletcher Anon  . Cataracts, bilateral   . Dizziness   . GERD (gastroesophageal reflux disease)   . Hypertension    sees Dr. Woody Seller, primary  . Low back pain   . Osteomyelitis ([place])    history of osteomyelistis in the leg  . Peripheral vascular disease ([place])   . Seasonal allergies   . Shortness of breath    "gets short of breath at time since the heart surgery 2012"  . Urinary tract infection    hx of  . Varicose veins     Past Surgical History:  Procedure Laterality Date  . ABDOMINAL AORTIC ANEURYSM REPAIR  11/21/2011   Procedure: ANEURYSM ABDOMINAL AORTIC REPAIR;  Surgeon: Mal Misty, MD;  Location[facility] OR;  Service: Vascular;  Laterality: N/A;  using 16 x 8 mm x 40 cm hemashield graft  . AORTIC VALVE REPLACEMENT  11/22/2010   64mm pericardial valve serial#[id number], model [id number]  . AORTIC VALVE REPLACEMENT     In 2012 with a bioprosthetic valve  . bladder tact     1960's  . CARDIAC CATHETERIZATION    . CATARACT EXTRACTION W/PHACO Right 12/09/2012  Procedure: CATARACT EXTRACTION PHACO AND INTRAOCULAR LENS PLACEMENT (IOC);  Surgeon: Tonny Branch, MD;  Location: [facility];  Service: Ophthalmology;  Laterality: Right;  CDE 19.32  . CATARACT EXTRACTION W/PHACO Left 12/27/2012   Procedure: LEFT EYE CATARACT EXTRACTION PHACO AND INTRAOCULAR LENS PLACEMENT ;  Surgeon: Tonny Branch, MD;  Location: [facility] ORS;  Service: Ophthalmology;  Laterality: Left;  CDE 10.78  . LAPAROTOMY  11/21/2011   Procedure: EXPLORATORY LAPAROTOMY;  Surgeon: Mal Misty, MD;  Location: [place];  Service: Vascular;  Laterality: N/A;  exploratory laparotomy, evacuation of hematoma, suture ligation of lumbar arterial branch   . PATCH ANGIOPLASTY  11/21/2011   Procedure: PATCH ANGIOPLASTY;  Surgeon: Mal Misty, MD;  Location: [place];  Service: Vascular;  Laterality: Right;   Iliac vein  . SPINE SURGERY  1967  1976   dr. Maryjean Ka     reports that she has never smoked. She has never used smokeless tobacco. She reports that she does not drink alcohol and does not use drugs.  Allergies  Allergen Reactions  . Avelox [Moxifloxacin Hcl In Nacl] Rash    Avelox began at approximately 0800. At 0807 pt noted to have red-streaking proximal to the PIV site, up the left arm, to the neck/chest area. Avelox immediately discontinued. Pt received about 1/4 the dose or 100mg . Vital signs remained stable. The anesthesiologist and surgeon were notified and shown the site of red, rashy streaking.  Marland Kitchen Penicillins Swelling  . Sulfa Antibiotics Other (See Comments)    unknown    Family History  Problem Relation Age of Onset  . Other Mother        Varicose Veins  . Cancer Sister   . Hyperlipidemia Brother   . Other Brother      Prior to Admission medications   Medication Sig Start Date End Date Taking? Authorizing Provider  aspirin 325 MG EC tablet Take 1 tablet by mouth daily. 10/12/19  Yes [provider]  fish oil-omega-3 fatty acids 1000 MG capsule Take 1 g by mouth daily.   Yes [provider]  Garlic 0865 MG CAPS Take 1,000 mg by mouth daily.   Yes [provider]  levothyroxine (SYNTHROID, LEVOTHROID) 50 MCG tablet Take 50 mcg by mouth daily before breakfast.   Yes [provider]  metoprolol tartrate (LOPRESSOR) 25 MG tablet TAKE ONE TABLET BY MOUTH TWICE DAILY 01/21/18  Yes Branch, Alphonse Guild, MD  Multiple Vitamin (MULTIVITAMIN) tablet Take 1 tablet by mouth daily.     Yes [provider]  Multiple Vitamins-Minerals (PRESERVISION AREDS 2) CAPS Take 1 capsule by mouth daily.   Yes [provider]  polyethylene glycol powder (GLYCOLAX/MIRALAX) 17 GM/SCOOP powder Take 17 g by mouth daily. 10/12/19  Yes [provider]  raloxifene (EVISTA) 60 MG tablet Take 60 mg by mouth daily.  11/02/12  Yes [provider]  sertraline (ZOLOFT) 50 MG tablet Take 50 mg by mouth daily.  12/20/10  Yes [provider]  sodium chloride 1 g tablet Take 1 tablet by mouth in the morning, at noon, and at bedtime. 10/12/19  Yes [provider]  valsartan (DIOVAN) 80 MG tablet Take 80 mg by mouth daily. 11/18/19  Yes [provider]    Physical Exam: Vitals:   12/15/19 0904 12/15/19 0907 12/15/19 0930 12/15/19 1115  BP:  (!) 163/66 (!) 169/86 (!) 178/78  Pulse:  70 72 71  Resp:  18 20 18   Temp:  98.3 F (36.8 C)  TempSrc:  Oral    SpO2:  95% 95% 99%  Weight: 63.5 kg      Constitutional: elderly hard of hearing female, awake, cooperative, NAD, calm, comfortable Eyes: PERRL, lids and conjunctivae normal ENMT: Mucous membranes are moist. Posterior pharynx clear of any exudate or lesions.Normal dentition.  Neck: normal, supple, no masses, no thyromegaly Respiratory: clear to auscultation bilaterally, no wheezing, no crackles. Normal respiratory effort. No accessory muscle use.  Cardiovascular: normal s1, s2 sounds, no murmurs / rubs / gallops. No extremity edema. 2+ pedal pulses. No carotid bruits.  Abdomen: no tenderness, no masses palpated. No hepatosplenomegaly. Bowel sounds positive.  Musculoskeletal: no clubbing / cyanosis. No joint deformity upper and lower extremities. Good ROM, no contractures. Normal muscle tone.  Skin: no rashes, lesions, ulcers. No induration Neurologic: CN 2-12 grossly intact. Sensation intact, DTR normal. Strength 5/5 in all 4.  Psychiatric: Normal judgment and insight. Alert and oriented x 3. Normal mood.   Labs on Admission: I have personally reviewed following labs and imaging studies  CBC: Recent Labs  Lab 12/15/19 0920  WBC 8.6  NEUTROABS 5.3  HGB 9.9*  HCT 33.7*  MCV 99.7  PLT 793   Basic Metabolic Panel: Recent Labs  Lab 12/15/19 0920  NA 141  K 3.9  CL 106  CO2 28  GLUCOSE 102*  BUN 16  CREATININE 0.62  CALCIUM 8.6*    GFR: CrCl cannot be calculated (Unknown ideal weight.). Liver Function Tests: Recent Labs  Lab 12/15/19 0920  AST 15  ALT 11  ALKPHOS 52  BILITOT 0.4  PROT 6.4*  ALBUMIN 3.5   Recent Labs  Lab 12/15/19 0920  LIPASE 24   No results for input(s): AMMONIA in the last 168 hours. Coagulation Profile: No results for input(s): INR, PROTIME in the last 168 hours. Cardiac Enzymes: No results for input(s): CKTOTAL, CKMB, CKMBINDEX, TROPONINI in the last 168 hours. BNP (last 3 results) No results for input(s): PROBNP in the last 8760 hours. HbA1C: No results for input(s): HGBA1C in the last 72 hours. CBG: No results for input(s): GLUCAP in the last 168 hours. Lipid Profile: No results for input(s): CHOL, HDL, LDLCALC, TRIG, CHOLHDL, LDLDIRECT in the last 72 hours. Thyroid Function Tests: No results for input(s): TSH, T4TOTAL, FREET4, T3FREE, THYROIDAB in the last 72 hours. Anemia Panel: No results for input(s): VITAMINB12, FOLATE, FERRITIN, TIBC, IRON, RETICCTPCT in the last 72 hours.  Radiological Exams on Admission: CT ABDOMEN PELVIS W CONTRAST  Result Date: 12/15/2019 CLINICAL DATA:  Episodes of bloody stool EXAM: CT ABDOMEN AND PELVIS WITH CONTRAST TECHNIQUE: Multidetector CT imaging of the abdomen and pelvis was performed using the standard protocol following bolus administration of intravenous contrast. CONTRAST:  166mL OMNIPAQUE IOHEXOL 300 MG/ML  SOLN COMPARISON:  October 06, 2019 FINDINGS: Lower chest: No acute abnormality. Hepatobiliary: Stable cyst of the right hepatic lobe. Layering increased density within the gallbladder may reflect sludge or calculus. No biliary dilatation. Pancreas: Unremarkable. Spleen: Unremarkable. Adrenals/Urinary Tract: Stable mildly nodular appearance of left adrenal. Bilateral renal cysts. Bladder is unremarkable. Stomach/Bowel: Stomach is within normal limits. Bowel is normal in caliber. Vascular/Lymphatic: Aortoiliac bypass graft. Extensive  aortoiliac atherosclerosis. No enlarged lymph nodes identified. Reproductive: Multiple uterine calcifications again identified probably reflecting fibroids. No pelvic mass. Other: Wide defect fat containing ventral abdominal wall hernia above the umbilicus is unchanged. No ascites. Musculoskeletal: Advanced degenerative changes of the included spine. Degenerative changes of the hips. IMPRESSION: No acute abnormality. Electronically Signed   By: Malachi Carl  Patel M.D.   On: 12/15/2019 10:52   Assessment/Plan Principal Problem:   Bright red rectal bleeding Active Problems:   Aortic stenosis   Hypertension   Atrial fibrillation (HCC)   Long term (current) use of anticoagulants   S/P AVR   Acute blood loss anemia   Hypotension   S/P AAA repair   Heme positive stool   Generalized abdominal pain  1. Acute GI bleeding - strongly suspect secondary to daily aspirin use which she has been on since October.  HOLD ASPIRIN for now.  Continue IV protonix as ordered. Clear liquid diet. Appreciate GI consultation.  2. Essential hypertension -resume home blood pressure lowering medication and IV metoprolol ordered as needed.  3. Generalized abdominal pain - secondary to GI hemorrhage.  Treating supportively.  Pain meds ordered as needed.  4. DNR present on admission - I confirmed with patient and sister at bedside.    DVT prophylaxis: SCDs  Code Status: DNR   Family Communication: sister at bedside   Disposition Plan: TBD   Consults called: GI   Admission status: INP   Ashlin Hidalgo MD [facility] How to contact the [facility] Attending or Consulting provider [place] or covering provider during after hours [place], for this patient?  1. Check the care team in [facility] and look for a) attending/consulting [facility] provider listed and b) the [facility] team listed 2. Log into [email or web address] and use [place]'s universal password to access. If you do not have the password, please contact the hospital  operator. 3. Locate the [facility] provider you are looking for under [facility] and page to a number that you can be directly reached. 4. If you still have difficulty reaching the provider, please page the [facility] (Director on Call) for the Hospitalists listed on amion for assistance.   If 7PM-7AM, please contact night-coverage [email or web address] Password [email or web address]  12/15/2019, 11:54 AM

## 2019-12-15 NOTE — ED Provider Notes (Signed)
[facility] Provider Note   CSN: [id number] Arrival date & time: 12/15/19  0848     History Chief Complaint  Patient presents with  . Rectal Bleeding    Lauren Morrow is a 86 y.o. female history GERD, PVD, A. fib, hypertension, aortic stenosis, osteomyelitis, AAA.  Patient reports that she does not use blood thinners.  Patient presents today for concern of bright red blood in her stool onset yesterday afternoon.  She has had 2 total bowel movements since yesterday afternoon both with bright red blood, reports associated lower abdominal pain described as a mild soreness constant nonradiating no aggravating or alleviating factors.  She denies similar problem in the past.  Denies fever/chills, nausea/vomiting, dysuria/hematuria, chest pain/shortness of breath, fall/injury or any additional concerns.  HPI     Past Medical History:  Diagnosis Date  . Anemia    hx of  . Anxiety   . Aortic stenosis    Dr. Rod Can, saw last 09/11/11  . Arthritis   . Atrial fibrillation ([place])    Post op, sees  Dr. Fletcher Anon  . Cataracts, bilateral   . Dizziness   . GERD (gastroesophageal reflux disease)   . Hypertension    sees Dr. Woody Seller, primary  . Low back pain   . Osteomyelitis ([place])    history of osteomyelistis in the leg  . Peripheral vascular disease ([place])   . Seasonal allergies   . Shortness of breath    "gets short of breath at time since the heart surgery 2012"  . Urinary tract infection    hx of  . Varicose veins     Patient Active Problem List   Diagnosis Date Noted  . Bright red rectal bleeding 12/15/2019  . Heme positive stool 12/15/2019  . Generalized abdominal pain 12/15/2019  . Abdominal aneurysm without mention of rupture 02/10/2012  . AAA (abdominal aortic aneurysm) ([place]) 12/09/2011  . Respiratory distress 11/28/2011  . Encephalopathy acute 11/28/2011  . Diarrhea 11/27/2011  . Hypervolemia 11/23/2011  . Hypokalemia 11/23/2011  . Respiratory  failure, post-operative ([place]) 11/22/2011  . Acute blood loss anemia 11/22/2011  . Thrombocytopenia due to blood loss 11/22/2011  . Hypotension 11/22/2011  . S/P AAA repair 11/22/2011  . Preop cardiovascular exam 10/10/2011  . S/P AVR 10/10/2011  . Atrial fibrillation ([place]) 12/24/2010  . Heart valve replaced by other means 12/24/2010  . Long term (current) use of anticoagulants 12/24/2010  . Aortic stenosis   . Dizziness   . Hypertension     Past Surgical History:  Procedure Laterality Date  . ABDOMINAL AORTIC ANEURYSM REPAIR  11/21/2011   Procedure: ANEURYSM ABDOMINAL AORTIC REPAIR;  Surgeon: Mal Misty, MD;  Location: [facility] OR;  Service: Vascular;  Laterality: N/A;  using 16 x 8 mm x 40 cm hemashield graft  . AORTIC VALVE REPLACEMENT  11/22/2010   93mm pericardial valve serial#[id number], model 3300tfx  . AORTIC VALVE REPLACEMENT     In 2012 with a bioprosthetic valve  . bladder tact     1960's  . CARDIAC CATHETERIZATION    . CATARACT EXTRACTION W/PHACO Right 12/09/2012   Procedure: CATARACT EXTRACTION PHACO AND INTRAOCULAR LENS PLACEMENT ([place]);  Surgeon: Tonny Branch, MD;  Location: [facility];  Service: Ophthalmology;  Laterality: Right;  CDE 19.32  . CATARACT EXTRACTION W/PHACO Left 12/27/2012   Procedure: LEFT EYE CATARACT EXTRACTION PHACO AND INTRAOCULAR LENS PLACEMENT ;  Surgeon: Tonny Branch, MD;  Location: [facility];  Service: Ophthalmology;  Laterality: Left;  CDE 10.78  . LAPAROTOMY  11/21/2011   Procedure: EXPLORATORY LAPAROTOMY;  Surgeon: Mal Misty, MD;  Location: [facility];  Service: Vascular;  Laterality: N/A;  exploratory laparotomy, evacuation of hematoma, suture ligation of lumbar arterial branch   . PATCH ANGIOPLASTY  11/21/2011   Procedure: PATCH ANGIOPLASTY;  Surgeon: Mal Misty, MD;  Location: [facility];  Service: Vascular;  Laterality: Right;  Iliac vein  . SPINE SURGERY  1967  1976   dr. Maryjean Ka     OB History   No obstetric history on file.     Family History   Problem Relation Age of Onset  . Other Mother        Varicose Veins  . Cancer Sister   . Hyperlipidemia Brother   . Other Brother     Social History   Tobacco Use  . Smoking status: Never Smoker  . Smokeless tobacco: Never Used  Substance Use Topics  . Alcohol use: No    Alcohol/week: 0.0 standard drinks  . Drug use: No    Home Medications Prior to Admission medications   Medication Sig Start Date End Date Taking? Authorizing Provider  aspirin 325 MG EC tablet Take 1 tablet by mouth daily. 10/12/19  Yes [provider]  fish oil-omega-3 fatty acids 1000 MG capsule Take 1 g by mouth daily.   Yes [provider]  Garlic 9794 MG CAPS Take 1,000 mg by mouth daily.   Yes [provider]  levothyroxine (SYNTHROID, LEVOTHROID) 50 MCG tablet Take 50 mcg by mouth daily before breakfast.   Yes [provider]  metoprolol tartrate (LOPRESSOR) 25 MG tablet TAKE ONE TABLET BY MOUTH TWICE DAILY 01/21/18  Yes Branch, Alphonse Guild, MD  Multiple Vitamin (MULTIVITAMIN) tablet Take 1 tablet by mouth daily.     Yes [provider]  Multiple Vitamins-Minerals (PRESERVISION AREDS 2) CAPS Take 1 capsule by mouth daily.   Yes [provider]  polyethylene glycol powder (GLYCOLAX/MIRALAX) 17 GM/SCOOP powder Take 17 g by mouth daily. 10/12/19  Yes [provider]  raloxifene (EVISTA) 60 MG tablet Take 60 mg by mouth daily.  11/02/12  Yes [provider]  sertraline (ZOLOFT) 50 MG tablet Take 50 mg by mouth daily.  12/20/10  Yes [provider]  sodium chloride 1 g tablet Take 1 tablet by mouth in the morning, at noon, and at bedtime. 10/12/19  Yes [provider]  valsartan (DIOVAN) 80 MG tablet Take 80 mg by mouth daily. 11/18/19  Yes [provider]    Allergies    Avelox [moxifloxacin hcl in nacl], Penicillins, and Sulfa antibiotics  Review of Systems   Review of Systems Ten systems are reviewed and are  negative for acute change except as noted in the HPI  Physical Exam Updated Vital Signs BP (!) 178/78   Pulse 71   Temp 98.3 F (36.8 C) (Oral)   Resp 18   Wt 63.5 kg   SpO2 99%   BMI 21.93 kg/m   Physical Exam Constitutional:      General: She is not in acute distress.    Appearance: Normal appearance. She is well-developed. She is not ill-appearing or diaphoretic.  HENT:     Head: Normocephalic and atraumatic.  Eyes:     General: Vision grossly intact. Gaze aligned appropriately.     Pupils: Pupils are equal, round, and reactive to light.  Neck:     Trachea: Trachea and phonation normal.  Pulmonary:  Effort: Pulmonary effort is normal. No respiratory distress.  Abdominal:     General: There is no distension.     Palpations: Abdomen is soft.     Tenderness: There is abdominal tenderness (Diffuse lower abdominal tenderness). There is no guarding or rebound.  Genitourinary:    Comments: Rectal examination chaperoned by Nigel Mormon.  No external hemorrhoids or abnormalities.  Normal rectal tone.  No palpable internal hemorrhoids or abnormalities.  Red blood mixed with stool. Musculoskeletal:        General: Normal range of motion.     Cervical back: Normal range of motion.  Skin:    General: Skin is warm and dry.  Neurological:     Mental Status: She is alert.     GCS: GCS eye subscore is 4. GCS verbal subscore is 5. GCS motor subscore is 6.     Comments: Speech is clear and goal oriented, follows commands Major Cranial nerves without deficit, no facial droop Moves extremities without ataxia, coordination intact  Psychiatric:        Behavior: Behavior normal.     ED Results / Procedures / Treatments   Labs (all labs ordered are listed, but only abnormal results are displayed) Labs Reviewed  CBC WITH DIFFERENTIAL/PLATELET - Abnormal; Notable for the following components:      Result Value   RBC 3.38 (*)    Hemoglobin 9.9 (*)    HCT 33.7 (*)    MCHC 29.4 (*)     All other components within normal limits  COMPREHENSIVE METABOLIC PANEL - Abnormal; Notable for the following components:   Glucose, Bld 102 (*)    Calcium 8.6 (*)    Total Protein 6.4 (*)    All other components within normal limits  POC OCCULT BLOOD, ED - Abnormal; Notable for the following components:   Fecal Occult Bld POSITIVE (*)    All other components within normal limits  LIPASE, BLOOD  URINALYSIS, ROUTINE W REFLEX MICROSCOPIC  TYPE AND SCREEN    EKG None  Radiology CT ABDOMEN PELVIS W CONTRAST  Result Date: 12/15/2019 CLINICAL DATA:  Episodes of bloody stool EXAM: CT ABDOMEN AND PELVIS WITH CONTRAST TECHNIQUE: Multidetector CT imaging of the abdomen and pelvis was performed using the standard protocol following bolus administration of intravenous contrast. CONTRAST:  140mL OMNIPAQUE IOHEXOL 300 MG/ML  SOLN COMPARISON:  October 06, 2019 FINDINGS: Lower chest: No acute abnormality. Hepatobiliary: Stable cyst of the right hepatic lobe. Layering increased density within the gallbladder may reflect sludge or calculus. No biliary dilatation. Pancreas: Unremarkable. Spleen: Unremarkable. Adrenals/Urinary Tract: Stable mildly nodular appearance of left adrenal. Bilateral renal cysts. Bladder is unremarkable. Stomach/Bowel: Stomach is within normal limits. Bowel is normal in caliber. Vascular/Lymphatic: Aortoiliac bypass graft. Extensive aortoiliac atherosclerosis. No enlarged lymph nodes identified. Reproductive: Multiple uterine calcifications again identified probably reflecting fibroids. No pelvic mass. Other: Wide defect fat containing ventral abdominal wall hernia above the umbilicus is unchanged. No ascites. Musculoskeletal: Advanced degenerative changes of the included spine. Degenerative changes of the hips. IMPRESSION: No acute abnormality. Electronically Signed   By: Macy Mis M.D.   On: 12/15/2019 10:52    Procedures Procedures (including critical care  time)  Medications Ordered in ED Medications  morphine 2 MG/ML injection 2 mg (0 mg Intravenous Hold 12/15/19 0946)  sodium chloride 0.9 % bolus 500 mL (500 mLs Intravenous New Bag/Given 12/15/19 0928)  iohexol (OMNIPAQUE) 300 MG/ML solution 100 mL (100 mLs Intravenous Contrast Given 12/15/19 1028)    ED Course  I have reviewed the triage vital signs and the nursing notes.  Pertinent labs & imaging results that were available during my care of the patient were reviewed by me and considered in my medical decision making (see chart for details).  Clinical Course as of 12/15/19 1145  Thu Dec 15, 2019  1126 Magda Paganini [facility] [BM]    Clinical Course User Index [BM] Gari Crown   MDM Rules/Calculators/A&P                         Additional history obtained from: 1. Nursing notes from this visit. 2. Review of electronic medical records. ---------------------- 86 year old female who does not take blood thinners presents for bright red blood in her stool onset yesterday afternoon.  Associated with some diffuse lower abdominal pain.  She is moderately tender on exam without peritoneal signs.  She has bright red blood on rectal examination without evidence of hemorrhoid or other abnormalities.  Vital signs are stable on arrival.  Will obtain labs as well as CT abdomen pelvis for evaluation of possibly diverticulitis among other intra-abdominal pathologies.  Will give fluids and pain medication. - Hemoccult positive Type and screen a positive, antibody is negative Lipase within normal limits CBC without leukocytosis to suggest infection.  Hemoglobin 9.9 decreased from last hemoglobin on file around 7 years ago. CMP shows no emergent electrolyte derangement, AKI, LFT elevations or gap. CT [facility]:  IMPRESSION:  No acute abnormality.   11:26 AM: Consulted gastroenterology team spoke with Magda Paganini PA C, advises patient had a hemoglobin on file from last year which was around 14.  Asked for  hospitalist admission and GI team will see patient. - 11:38 AM: Consulted hospitalist Dr. Wynetta Emery, patient was accepted for admission.  Patient reassessment, resting comfortably in bed no acute distress her sister is at bedside.  They are agreeable to plan of admission they have no further questions or concerns.  Note: Portions of this report may have been transcribed using voice recognition software. Every effort was made to ensure accuracy; however, inadvertent computerized transcription errors may still be present. Final Clinical Impression(s) / ED Diagnoses Final diagnoses:  Lower GI bleed    Rx / DC Orders ED Discharge Orders    None       Gari Crown 12/15/19 1145    M[place] Ferguson, MD 12/16/19 [phone]

## 2019-12-15 NOTE — Consult Note (Addendum)
Referring Provider: Murlean Iba, MD Primary Care Physician:  Glenda Chroman, MD Primary Gastroenterologist:  Garfield Cornea, MD (previously unassigned)  Reason for Consultation:  GI bleed  HPI: Lauren Morrow is a 86 y.o. female with history of GERD, peripheral vascular disease, A. fib, hypertension, aortic valve replacement, AAA repair presented to the emergency department with 2 episodes of bloody stool.   First episode yesterday afternoon.  Woke up early this morning with second episode. 3 episodes total. Some mild diffuse lower abdominal tenderness. Patient states that her stool was black mixed with blood. Sister states that there was blood all over the bathroom, black jellylike consistency mixed with red blood. Denies any typical heartburn. No vomiting. Appetite has been good. She has had similar bleeding in the past. Sister states it was occurring last year and she was advised to have an upper endoscopy but patient declined. According to care everywhere patient was scheduled to see general surgeon, Dr. Adelina Mings back in May for GI bleeding but she canceled the appointment. Patient is unsure whether she is ever had a colonoscopy or upper endoscopy. She has had some memory issues since her fall back in October.  In the ED, hemoglobin 9.9, hematocrit 33.7, MCV 99.7, platelets 271,000, BUN 16, creatinine 0.62. CT abdomen pelvis with contrast today showed uterine fibroids, wide defect fat-containing ventral abdominal wall hernia above the umbilicus unchanged, sludge versus gallstones within the gallbladder. On rectal exam she had no evidence of external hemorrhoids or palpable internal hemorrhoids.  Red blood noted mixed with stool.  Labs through care everywhere dated 11/02/2019: Hemoglobin 8.9, hematocrit 28.6, MCV 102.1. Hemoglobin have been normal 10/07/2019 (13.8).  Recent hospitalization from October 1 through October 6 after sustaining a mechanical fall with injury of the  internal carotid artery, occipital fracture, small SAH, subdural hygroma.  Noted to have significant hyponatremia as well. Prior to this episode, she was living alone, performing all ADLs, driving. She did go to rehabilitation for a couple of weeks prior to returning home. Continues to live at home alone, sister assists with her care.  Patient has been on aspirin 325 mg daily since October. Denies any other NSAIDs or blood thinners.   Prior to Admission medications   Medication Sig Start Date End Date Taking? Authorizing Provider  aspirin 325 MG EC tablet Take 1 tablet by mouth daily. 10/12/19  Yes [provider]  fish oil-omega-3 fatty acids 1000 MG capsule Take 1 g by mouth daily.   Yes [provider]  Garlic 3570 MG CAPS Take 1,000 mg by mouth daily.   Yes [provider]  levothyroxine (SYNTHROID, LEVOTHROID) 50 MCG tablet Take 50 mcg by mouth daily before breakfast.   Yes [provider]  metoprolol tartrate (LOPRESSOR) 25 MG tablet TAKE ONE TABLET BY MOUTH TWICE DAILY 01/21/18  Yes Branch, Alphonse Guild, MD  Multiple Vitamin (MULTIVITAMIN) tablet Take 1 tablet by mouth daily.     Yes [provider]  Multiple Vitamins-Minerals (PRESERVISION AREDS 2) CAPS Take 1 capsule by mouth daily.   Yes [provider]  polyethylene glycol powder (GLYCOLAX/MIRALAX) 17 GM/SCOOP powder Take 17 g by mouth daily. 10/12/19  Yes [provider]  raloxifene (EVISTA) 60 MG tablet Take 60 mg by mouth daily.  11/02/12  Yes [provider]  sertraline (ZOLOFT) 50 MG tablet Take 50 mg by mouth daily.  12/20/10  Yes [provider]  sodium chloride 1 g tablet Take 1 tablet by mouth in the morning,  at noon, and at bedtime. 10/12/19  Yes [provider]  valsartan (DIOVAN) 80 MG tablet Take 80 mg by mouth daily. 11/18/19  Yes [provider]    Current Facility-Administered Medications  Medication Dose Route Frequency  Provider Last Rate Last Admin  . 0.9 %  sodium chloride infusion   Intravenous Continuous Wynetta Emery, Clanford L, MD 50 mL/hr at 12/15/19 1234 New Bag at 12/15/19 1234  . acetaminophen (TYLENOL) tablet 650 mg  650 mg Oral Q6H PRN Johnson, Clanford L, MD       Or  . acetaminophen (TYLENOL) suppository 650 mg  650 mg Rectal Q6H PRN Johnson, Clanford L, MD      . HYDROmorphone (DILAUDID) injection 0.25 mg  0.25 mg Intravenous Q2H PRN Johnson, Clanford L, MD      . irbesartan (AVAPRO) tablet 75 mg  75 mg Oral Daily Johnson, Clanford L, MD   75 mg at 12/15/19 1451  . levothyroxine (SYNTHROID) tablet 50 mcg  50 mcg Oral Q0600 Wynetta Emery, Clanford L, MD   50 mcg at 12/15/19 1233  . metoprolol tartrate (LOPRESSOR) injection 5 mg  5 mg Intravenous Q6H PRN Johnson, Clanford L, MD      . metoprolol tartrate (LOPRESSOR) tablet 25 mg  25 mg Oral BID Johnson, Clanford L, MD   25 mg at 12/15/19 1233  . morphine 2 MG/ML injection 2 mg  2 mg Intravenous Once Johnson, Clanford L, MD      . ondansetron (ZOFRAN) tablet 4 mg  4 mg Oral Q6H PRN Johnson, Clanford L, MD       Or  . ondansetron (ZOFRAN) injection 4 mg  4 mg Intravenous Q6H PRN Johnson, Clanford L, MD      . oxyCODONE (Oxy IR/ROXICODONE) immediate release tablet 5 mg  5 mg Oral Q4H PRN Johnson, Clanford L, MD      . pantoprazole (PROTONIX) injection 40 mg  40 mg Intravenous Q24H Johnson, Clanford L, MD   40 mg at 12/15/19 1233  . polyethylene glycol (MIRALAX / GLYCOLAX) packet 17 g  17 g Oral Daily PRN Johnson, Clanford L, MD      . raloxifene (EVISTA) tablet 60 mg  60 mg Oral Daily Johnson, Clanford L, MD   60 mg at 12/15/19 1452  . sertraline (ZOLOFT) tablet 50 mg  50 mg Oral Daily Johnson, Clanford L, MD   50 mg at 12/15/19 1233  . traZODone (DESYREL) tablet 25 mg  25 mg Oral QHS PRN Murlean Iba, MD       Current Outpatient Medications  Medication Sig Dispense Refill  . aspirin 325 MG EC tablet Take 1 tablet by mouth daily.    . fish oil-omega-3  fatty acids 1000 MG capsule Take 1 g by mouth daily.    . Garlic 9381 MG CAPS Take 1,000 mg by mouth daily.    Marland Kitchen levothyroxine (SYNTHROID, LEVOTHROID) 50 MCG tablet Take 50 mcg by mouth daily before breakfast.    . metoprolol tartrate (LOPRESSOR) 25 MG tablet TAKE ONE TABLET BY MOUTH TWICE DAILY 180 tablet 3  . Multiple Vitamin (MULTIVITAMIN) tablet Take 1 tablet by mouth daily.      . Multiple Vitamins-Minerals (PRESERVISION AREDS 2) CAPS Take 1 capsule by mouth daily.    . polyethylene glycol powder (GLYCOLAX/MIRALAX) 17 GM/SCOOP powder Take 17 g by mouth daily.    . raloxifene (EVISTA) 60 MG tablet Take 60 mg by mouth daily.     . sertraline (ZOLOFT) 50 MG tablet Take  50 mg by mouth daily.     . sodium chloride 1 g tablet Take 1 tablet by mouth in the morning, at noon, and at bedtime.    . valsartan (DIOVAN) 80 MG tablet Take 80 mg by mouth daily.      Allergies as of 12/15/2019 - Review Complete 12/15/2019  Allergen Reaction Noted  . Avelox [moxifloxacin hcl in nacl] Rash 11/22/2010  . Penicillins Swelling 10/14/2010  . Sulfa antibiotics Other (See Comments) 10/14/2010    Past Medical History:  Diagnosis Date  . Anemia    hx of  . Anxiety   . Aortic stenosis    Dr. Rod Can, saw last 09/11/11  . Arthritis   . Atrial fibrillation ([place])    Post op, sees  Dr. Fletcher Anon  . Cataracts, bilateral   . Dizziness   . GERD (gastroesophageal reflux disease)   . Hypertension    sees Dr. Woody Seller, primary  . Low back pain   . Osteomyelitis ([place])    history of osteomyelistis in the leg  . Peripheral vascular disease ([place])   . Seasonal allergies   . Shortness of breath    "gets short of breath at time since the heart surgery 2012"  . Urinary tract infection    hx of  . Varicose veins     Past Surgical History:  Procedure Laterality Date  . ABDOMINAL AORTIC ANEURYSM REPAIR  11/21/2011   Procedure: ANEURYSM ABDOMINAL AORTIC REPAIR;  Surgeon: Mal Misty, MD;  Location: [facility] OR;  Service:  Vascular;  Laterality: N/A;  using 16 x 8 mm x 40 cm hemashield graft  . AORTIC VALVE REPLACEMENT  11/22/2010   17mm pericardial valve serial#[id number], model 3300tfx  . AORTIC VALVE REPLACEMENT     In 2012 with a bioprosthetic valve  . bladder tact     1960's  . CARDIAC CATHETERIZATION    . CATARACT EXTRACTION W/PHACO Right 12/09/2012   Procedure: CATARACT EXTRACTION PHACO AND INTRAOCULAR LENS PLACEMENT ([place]);  Surgeon: Tonny Branch, MD;  Location: [facility];  Service: Ophthalmology;  Laterality: Right;  CDE 19.32  . CATARACT EXTRACTION W/PHACO Left 12/27/2012   Procedure: LEFT EYE CATARACT EXTRACTION PHACO AND INTRAOCULAR LENS PLACEMENT ;  Surgeon: Tonny Branch, MD;  Location: [facility];  Service: Ophthalmology;  Laterality: Left;  CDE 10.78  . LAPAROTOMY  11/21/2011   Procedure: EXPLORATORY LAPAROTOMY;  Surgeon: Mal Misty, MD;  Location: [place];  Service: Vascular;  Laterality: N/A;  exploratory laparotomy, evacuation of hematoma, suture ligation of lumbar arterial branch   . PATCH ANGIOPLASTY  11/21/2011   Procedure: PATCH ANGIOPLASTY;  Surgeon: Mal Misty, MD;  Location: [place];  Service: Vascular;  Laterality: Right;  Iliac vein  . SPINE SURGERY  1967  1976   dr. Maryjean Ka    Family History  Problem Relation Age of Onset  . Other Mother        Varicose Veins  . Cancer Sister   . Hyperlipidemia Brother   . Other Brother     Social History   Socioeconomic History  . Marital status: Widowed    Spouse name: Not on file  . Number of children: Not on file  . Years of education: Not on file  . Highest education level: Not on file  Occupational History  . Not on file  Tobacco Use  . Smoking status: Never Smoker  . Smokeless tobacco: Never Used  Substance and Sexual Activity  . Alcohol use: No    Alcohol/week:  0.0 standard drinks  . Drug use: No  . Sexual activity: Yes    Birth control/protection: None  Other Topics Concern  . Not on file  Social History Narrative  . Not on  file   Social Determinants of Health   Financial Resource Strain: Not on file  Food Insecurity: Not on file  Transportation Needs: Not on file  Physical Activity: Not on file  Stress: Not on file  Social Connections: Not on file  Intimate Partner Violence: Not on file     ROS:  General: Negative for anorexia, weight loss, fever, chills, fatigue, positive weakness. Complains of lightheadedness. Eyes: Negative for vision changes.  ENT: Negative for hoarseness, difficulty swallowing , nasal congestion. CV: Negative for chest pain, angina, palpitations, dyspnea on exertion, peripheral edema.  Respiratory: Negative for dyspnea at rest, dyspnea on exertion, cough, sputum, wheezing.  GI: See history of present illness. GU:  Negative for dysuria, hematuria, urinary incontinence, urinary frequency, nocturnal urination.  MS: Negative for joint pain, low back pain.  Derm: Negative for rash or itching.  Neuro: Negative for weakness, abnormal sensation, seizure, frequent headaches,   confusion. See HPI Psych: Negative for anxiety, depression, suicidal ideation, hallucinations.  Endo: Negative for unusual weight change.  Heme: Negative for bruising or bleeding. Allergy: Negative for rash or hives.       Physical Examination: Vital signs in last 24 hours: Temp:  [98.3 F (36.8 C)] 98.3 F (36.8 C) (12/09 0907) Pulse Rate:  [64-72] 66 (12/09 1500) Resp:  [16-32] 32 (12/09 1500) BP: (158-198)/(49-98) 158/49 (12/09 1500) SpO2:  [95 %-100 %] 100 % (12/09 1500) FiO2 (%):  [21 %] 21 % (12/09 1149) Weight:  [63.5 kg] 63.5 kg (12/09 0904)    General: Well-nourished, well-developed in no acute distress. Somewhat hard of hearing. Sister at bedside. Head: Normocephalic, atraumatic.   Eyes: Conjunctiva pale, no icterus. Mouth: Oropharyngeal mucosa moist and pink , no lesions erythema or exudate. Neck: Supple without thyromegaly, masses, or lymphadenopathy.  Lungs: Clear to auscultation  bilaterally.  Heart: Regular rate and rhythm, no murmurs rubs or gallops.  Abdomen: Bowel sounds are normal, nontender, nondistended, no hepatosplenomegaly or masses, no abdominal bruits or    hernia , no rebound or guarding.   Rectal: Not performed. Completed by ED provider. Extremities: No lower extremity edema, clubbing, deformity.  Neuro: Alert and oriented x 4 , grossly normal neurologically.  Skin: Warm and dry, no rash or jaundice.   Psych: Alert and cooperative, normal mood and affect.        Intake/Output from previous day: No intake/output data recorded. Intake/Output this shift: No intake/output data recorded.  Lab Results: CBC Recent Labs    12/15/19 0920  WBC 8.6  HGB 9.9*  HCT 33.7*  MCV 99.7  PLT 271   BMET Recent Labs    12/15/19 0920  NA 141  K 3.9  CL 106  CO2 28  GLUCOSE 102*  BUN 16  CREATININE 0.62  CALCIUM 8.6*   LFT Recent Labs    12/15/19 0920  BILITOT 0.4  ALKPHOS 52  AST 15  ALT 11  PROT 6.4*  ALBUMIN 3.5    Lipase Recent Labs    12/15/19 0920  LIPASE 24    PT/INR No results for input(s): LABPROT, INR in the last 72 hours.    Imaging Studies: CT ABDOMEN PELVIS W CONTRAST  Result Date: 12/15/2019 CLINICAL DATA:  Episodes of bloody stool EXAM: CT ABDOMEN AND PELVIS WITH CONTRAST TECHNIQUE: Multidetector  CT imaging of the abdomen and pelvis was performed using the standard protocol following bolus administration of intravenous contrast. CONTRAST:  141mL OMNIPAQUE IOHEXOL 300 MG/ML  SOLN COMPARISON:  October 06, 2019 FINDINGS: Lower chest: No acute abnormality. Hepatobiliary: Stable cyst of the right hepatic lobe. Layering increased density within the gallbladder may reflect sludge or calculus. No biliary dilatation. Pancreas: Unremarkable. Spleen: Unremarkable. Adrenals/Urinary Tract: Stable mildly nodular appearance of left adrenal. Bilateral renal cysts. Bladder is unremarkable. Stomach/Bowel: Stomach is within normal limits.  Bowel is normal in caliber. Vascular/Lymphatic: Aortoiliac bypass graft. Extensive aortoiliac atherosclerosis. No enlarged lymph nodes identified. Reproductive: Multiple uterine calcifications again identified probably reflecting fibroids. No pelvic mass. Other: Wide defect fat containing ventral abdominal wall hernia above the umbilicus is unchanged. No ascites. Musculoskeletal: Advanced degenerative changes of the included spine. Degenerative changes of the hips. IMPRESSION: No acute abnormality. Electronically Signed   By: Macy Mis M.D.   On: 12/15/2019 10:52  [4 week]   Impression: Pleasant 86 year old female with multiple comorbidities as outlined above presenting with 3 episodes of bloody stool.   GI bleed: Patient describes stool as black mixed with red blood. Similar episode within the past year, declined work-up at that time as requested by PCP. Hemoglobin on presentation 9.9. On October 27 her hemoglobin was 8.9, on October 1 her hemoglobin was 13.8. She is on aspirin 325 mg daily. No other NSAIDs. No blood thinners or antiplatelet therapy otherwise. No PPI therapy. She cannot recall having a previous colonoscopy or upper endoscopy. She is hemodynamically stable. Given reported black stool, would be concerned about upper GI bleeding, cannot exclude bleeding from the small bowel right colon. Might be beneficial to consider upper endoscopy initially.   Plan: 1. IV Protonix 40 mg daily. 2. Recheck H&H now. 3. Consider an upper endoscopy.  We would like to thank you for the opportunity to participate in the care of Lauren Morrow.  Laureen Ochs. Bernarda Caffey [facility] [phone] 12/9/20214:44 PM  Attending note:  Will make NPO past MN;  Will be re-assessed in am.  Agree, EGD may be initial management strategy.   LOS: 0 days

## 2019-12-15 NOTE — ED Triage Notes (Signed)
Pt reports red blood mixed with stool yesterday and woke up at 4 am with another episode

## 2019-12-16 ENCOUNTER — Inpatient Hospital Stay (HOSPITAL_COMMUNITY): Payer: Medicare Other | Admitting: Anesthesiology

## 2019-12-16 ENCOUNTER — Encounter (HOSPITAL_COMMUNITY)
Admission: EM | Disposition: A | Discharge status: Home Health | Payer: Self-pay | Source: Home / Self Care | Attending: Family Medicine

## 2019-12-16 DIAGNOSIS — K2970 Gastritis, unspecified, without bleeding: Secondary | ICD-10-CM

## 2019-12-16 DIAGNOSIS — K921 Melena: Secondary | ICD-10-CM

## 2019-12-16 DIAGNOSIS — K922 Gastrointestinal hemorrhage, unspecified: Secondary | ICD-10-CM

## 2019-12-16 DIAGNOSIS — R1084 Generalized abdominal pain: Secondary | ICD-10-CM

## 2019-12-16 HISTORY — PX: ESOPHAGOGASTRODUODENOSCOPY (EGD) WITH PROPOFOL: SHX5813

## 2019-12-16 LAB — BASIC METABOLIC PANEL
Anion gap: 8 (ref 5–15)
BUN: 10 mg/dL (ref 8–23)
CO2: 26 mmol/L (ref 22–32)
Calcium: 8.4 mg/dL — ABNORMAL LOW (ref 8.9–10.3)
Chloride: 106 mmol/L (ref 98–111)
Creatinine, Ser: 0.51 mg/dL (ref 0.44–1.00)
GFR, Estimated: >60 mL/min (ref >60)
Glucose, Bld: 101 mg/dL — ABNORMAL HIGH (ref 70–99)
Potassium: 3.8 mmol/L (ref 3.5–5.1)
Sodium: 140 mmol/L (ref 135–145)

## 2019-12-16 LAB — CBC
HCT: 28.1 % — ABNORMAL LOW (ref 36.0–46.0)
Hemoglobin: 8.4 g/dL — ABNORMAL LOW (ref 12.0–15.0)
MCH: 29.5 pg (ref 26.0–34.0)
MCHC: 29.9 g/dL — ABNORMAL LOW (ref 30.0–36.0)
MCV: 98.6 fL (ref 80.0–100.0)
Platelets: 235 10*3/uL (ref 150–400)
RBC: 2.85 MIL/uL — ABNORMAL LOW (ref 3.87–5.11)
RDW: 12.3 % (ref 11.5–15.5)
WBC: 6.4 10*3/uL (ref 4.0–10.5)
nRBC: 0 % (ref 0.0–0.2)

## 2019-12-16 SURGERY — ESOPHAGOGASTRODUODENOSCOPY (EGD) WITH PROPOFOL
Anesthesia: General

## 2019-12-16 MED ORDER — ESMOLOL HCL 100 MG/10ML IV SOLN
INTRAVENOUS | Status: DC | PRN
  Administered 2019-12-16: 30 mg via INTRAVENOUS

## 2019-12-16 MED ORDER — LIDOCAINE VISCOUS HCL 2 % MT SOLN
OROMUCOSAL | Status: AC
  Filled 2019-12-16: qty 15

## 2019-12-16 MED ORDER — ESMOLOL HCL 100 MG/10ML IV SOLN
INTRAVENOUS | Status: AC
  Filled 2019-12-16: qty 10

## 2019-12-16 MED ORDER — PROPOFOL 10 MG/ML IV BOLUS
INTRAVENOUS | Status: DC | PRN
  Administered 2019-12-16: 40 mg via INTRAVENOUS
  Administered 2019-12-16: 20 mg via INTRAVENOUS
  Administered 2019-12-16: 40 mg via INTRAVENOUS
  Administered 2019-12-16: 50 mg via INTRAVENOUS
  Administered 2019-12-16: 30 mg via INTRAVENOUS

## 2019-12-16 MED ORDER — LACTATED RINGERS IV SOLN: INTRAVENOUS | Status: DC | PRN

## 2019-12-16 MED ORDER — LACTATED RINGERS IV SOLN: Freq: Once | INTRAVENOUS | Status: AC

## 2019-12-16 MED ORDER — SODIUM CHLORIDE 0.9 % IV SOLN: INTRAVENOUS | Status: DC

## 2019-12-16 MED ORDER — LIDOCAINE VISCOUS HCL 2 % MT SOLN
15.0000 mL | Freq: Once | OROMUCOSAL | Status: AC
  Administered 2019-12-16: 15 mL via OROMUCOSAL

## 2019-12-16 MED ORDER — PANTOPRAZOLE SODIUM 40 MG IV SOLR
40.0000 mg | Freq: Two times a day (BID) | INTRAVENOUS | Status: DC
  Administered 2019-12-16 – 2019-12-17 (×3): 40 mg via INTRAVENOUS
  Filled 2019-12-16 (×3): qty 40

## 2019-12-16 MED ORDER — CHLORHEXIDINE GLUCONATE CLOTH 2 % EX PADS
6.0000 | MEDICATED_PAD | Freq: Every day | CUTANEOUS | Status: DC
  Administered 2019-12-17: 08:00:00 6 via TOPICAL

## 2019-12-16 NOTE — Anesthesia Preprocedure Evaluation (Addendum)
Anesthesia Evaluation  Patient identified by MRN, date of birth, ID band Patient awake    Reviewed: Allergy & Precautions, NPO status , Patient's Chart, lab work & pertinent test results, reviewed documented beta blocker date and time   History of Anesthesia Complications Negative for: history of anesthetic complications  Airway Mallampati: I  TM Distance: >3 FB Neck ROM: Full    Dental  (+) Dental Advisory Given, Missing, Poor Dentition, Chipped   Pulmonary shortness of breath and with exertion,    Pulmonary exam normal breath sounds clear to auscultation       Cardiovascular Exercise Tolerance: Poor hypertension, Pt. on medications and Pt. on home beta blockers + Peripheral Vascular Disease (AAA Repair)  + dysrhythmias Atrial Fibrillation + Valvular Problems/Murmurs (AVR - 11/2010)  Rhythm:Regular Rate:Normal + Systolic murmurs    Neuro/Psych Anxiety CVA (traumatic subarachnoid hemorrhage)    GI/Hepatic Neg liver ROS, GERD  ,Upper GI bleeding   Endo/Other  negative endocrine ROS  Renal/GU negative Renal ROS     Musculoskeletal  (+) Arthritis ,   Abdominal   Peds  Hematology  (+) anemia ,   Anesthesia Other Findings Injuries present on admission and treatment required: - ? Grade 1 BCVI bilateral ICAs and V2 - occipital fracture  - small inferior right IPH and a ?left frontal pole parenchymal hemorrhage - small volume SAH - moderate b/l subdural collections (hygromas) - right ribs 4-6 age indeterminate rib fx      Reproductive/Obstetrics                          Anesthesia Physical Anesthesia Plan  ASA: IV and emergent  Anesthesia Plan: General   Post-op Pain Management:    Induction: Intravenous  PONV Risk Score and Plan: TIVA  Airway Management Planned: Nasal Cannula and Natural Airway  Additional Equipment:   Intra-op Plan:   Post-operative Plan:   Informed Consent:  I have reviewed the patients History and Physical, chart, labs and discussed the procedure including the risks, benefits and alternatives for the proposed anesthesia with the patient or authorized representative who has indicated his/her understanding and acceptance.    Discussed DNR with patient.   Dental advisory given  Plan Discussed with: CRNA and Surgeon  Anesthesia Plan Comments: (Patient agreed to get brief resuscitation in case of cardiac arrest during the procedure.)       Anesthesia Quick Evaluation

## 2019-12-16 NOTE — Op Note (Addendum)
[facility] Patient Name: Lauren Morrow Procedure Date: 12/16/2019 10:56 AM MRN: [id number] Date of Birth: June 12, 1924 Attending MD: [name] ,  CSN: [id number] Age: 86 Admit Type: Outpatient Procedure:                Upper GI endoscopy Indications:              [place] Providers:                [name], [place] Sharon Seller, RN, Rosina Lowenstein, RN Referring MD:              Medicines:                Monitored Anesthesia Care Complications:            No immediate complications. Estimated Blood Loss:     Estimated blood loss: none. Procedure:                Pre-Anesthesia Assessment:                           - Prior to the procedure, a History and Physical                            was performed, and patient medications, allergies                            and sensitivities were reviewed. The patient's                            tolerance of previous anesthesia was reviewed.                           - The risks and benefits of the procedure and the                            sedation options and risks were discussed with the                            patient. All questions were answered and informed                            consent was obtained.                           - ASA Grade Assessment: III - A patient with severe                            systemic disease.                           After obtaining informed consent, the endoscope was                            passed under direct vision. Throughout the  procedure, the patient's blood pressure, pulse, and                            oxygen saturations were monitored continuously. The                            [id number]-[id number] ([id number]) scope was introduced through the                            mouth, and advanced to the third part of duodenum.                            The upper GI endoscopy was accomplished without                             difficulty. The patient tolerated the procedure                            well. Scope In: 11:34:16 AM Scope Out: 69:48:54 AM Total Procedure Duration: 0 hours 11 minutes 51 seconds  Findings:      The examined esophagus was normal.      A few localized 2 to 3 mm erosions with stigmata of recent bleeding       (scant hematin) were found in the gastric fundus and in the gastric       antrum.      The examined duodenum was normal. Impression:               - Normal esophagus.                           - Erosive gastropathy with stigmata of recent                            bleeding.                           - Normal examined duodenum.                           - No specimens collected. Moderate Sedation:      Per Anesthesia Care Recommendation:           - Return patient to hospital ward for ongoing care.                           - Resume regular diet.                           - If patient presents rebleeding with drop in                            hemoglobin will need to consider inpatient capsule                            vs colonoscopy.                           -  Continue with pantoprazole 40 mg BID, will need                            to continue for at least 2 months, then can switch                            to daily dosing indefinitely.                           - Discuss with patient and family benefits of high                            dose aspirin vs risk of rebleeding. Procedure Code(s):        --- Professional ---                           [phone], GC, Esophagogastroduodenoscopy, flexible,                            transoral; diagnostic, including collection of                            specimen(s) by brushing or washing, when performed                            (separate procedure) Diagnosis Code(s):        --- Professional ---                           K92.2, Gastrointestinal hemorrhage, unspecified                           K92.1, Melena (includes  Hematochezia) CPT copyright 2019 [employer] Medical [employer]. All rights reserved. The codes documented in this report are preliminary and upon coder review may  be revised to meet current compliance requirements. [name], MD [name],  12/16/2019 11:53:02 AM This report has been signed electronically. Number of Addenda: 0

## 2019-12-16 NOTE — Plan of Care (Signed)
We will proceed with EGD as scheduled. She has remained HD stable and not required any transfusion overnight with stable Hb. I thoroughly discussed with the patient his procedure, including the risks involved. Patient understands what the procedure involves including the benefits and any risks. Patient understands alternatives to the proposed procedure. Risks including (but not limited to) bleeding, tearing of the lining (perforation), rupture of adjacent organs, problems with heart and lung function, infection, and medication reactions. A small percentage of complications may require surgery, hospitalization, repeat endoscopic procedure, and/or transfusion.  Patient understood and agreed.  [name], MD Gastroenterology and Hepatology [facility]

## 2019-12-16 NOTE — Anesthesia Postprocedure Evaluation (Signed)
Anesthesia Post Note  Patient: Lauren Morrow  Procedure(s) Performed: ESOPHAGOGASTRODUODENOSCOPY (EGD) WITH PROPOFOL (N/A )  Patient location during evaluation: PACU Anesthesia Type: General Level of consciousness: awake and awake and alert Pain management: pain level controlled Vital Signs Assessment: post-procedure vital signs reviewed and stable Respiratory status: spontaneous breathing Cardiovascular status: blood pressure returned to baseline Postop Assessment: no apparent nausea or vomiting Anesthetic complications: no   No complications documented.   Last Vitals:  Vitals:   12/16/19 1150 12/16/19 1200  BP: 134/63 (!) 182/90  Pulse:  72  Resp:  (!) 26  Temp: 36.6 C   SpO2: 96% 95%    Last Pain:  Vitals:   12/16/19 1200  TempSrc:   PainSc: 0-No pain                 Karna Dupes

## 2019-12-16 NOTE — Transfer of Care (Signed)
Immediate Anesthesia Transfer of Care Note  Patient: Lauren Morrow  Procedure(s) Performed: ESOPHAGOGASTRODUODENOSCOPY (EGD) WITH PROPOFOL (N/A )  Patient Location: PACU  Anesthesia Type:General  Level of Consciousness: drowsy  Airway & Oxygen Therapy: Patient Spontanous Breathing  Post-op Assessment: Report given to RN and Post -op Vital signs reviewed and stable  Post vital signs: Reviewed and stable  Last Vitals:  Vitals Value Taken Time  BP    Temp    Pulse    Resp    SpO2      Last Pain:  Vitals:   12/16/19 1132  TempSrc:   PainSc: 0-No pain      Patients Stated Pain Goal: 4 (50/87/19 9412)  Complications: No complications documented.

## 2019-12-16 NOTE — Brief Op Note (Signed)
12/15/2019 - 12/16/2019  11:53 AM  PATIENT:  Lauren Morrow  86 y.o. female  PRE-OPERATIVE DIAGNOSIS:  Anemia, GI bleed, dark stools  POST-OPERATIVE DIAGNOSIS:  gastric atrophy; antral erosions;   PROCEDURE:  Procedure(s): ESOPHAGOGASTRODUODENOSCOPY (EGD) WITH PROPOFOL (N/A)  SURGEON:  Surgeon(s) and Role:    * Harvel Quale, MD - Primary Perform esophagogastroduodenospy under propofol sedation today.  Patient was found to have normal esophagus.  Stomach had presence of healing erosions, which were located in the antrum and the fundus, there was a scant hematin suggestive of recent bleeding but none of those erosions were bleeding upon inspection.  Duodenum was completely normal upon inspection up to the third portion of the duodenum.  No hematin was seen.  RECOMMENDATIONS: - Return patient to hospital ward for ongoing care.  - Resume regular diet.  - If patient presents rebleeding with drop in hemoglobin will need to consider inpatient capsule vs colonoscopy. - Continue with pantoprazole 40 mg BID, will need to continue for at least 2 months, then can switch to daily dosing indefinitely. - Discuss with patient and family benefits of high dose aspirin vs risk of rebleeding.  [name], MD Gastroenterology and Hepatology [facility]

## 2019-12-16 NOTE — Progress Notes (Signed)
PROGRESS NOTE   Lauren Morrow  OVF:[id number] DOB: [birth year] DOA: 12/15/2019 PCP: Lauren Chroman, MD   Chief Complaint  Patient presents with  . Rectal Bleeding    Brief Admission History:   86 y.o. female with medical history significant for remote history of postoperative atrial fibrillation has only been on full strength aspirin daily for anticoagulation since October 2021 and history of falls, aortic stenoss, s/p valvular replacement, PVD, GERD, HTN, dizziness reports that she started having generalized abdominal pain and bright red rectal bleeding yesterday where she described red blood mixed with black mushy stools.  She says that she had another incident happen earlier this morning around 4am with similar presentation of red blood mixed with her mushy stool.  She called her sister and her PCP and was advised to come to the ED for further evaluation and GI consultation.  She reported that she had a bout of GI bleeding about 1 year ago but since that time has had no issues up until yesterday.  She has been having dizziness for the past few days.  No recent falls.  No dysuria. No emesis.  No loss of appetite. No chest pain and no shortness of breath.    Assessment & Plan:   Principal Problem:   Bright red rectal bleeding Active Problems:   Aortic stenosis   Hypertension   Atrial fibrillation (HCC)   Long term (current) use of anticoagulants   S/P AVR   Acute blood loss anemia   S/P AAA repair   Heme positive stool   Generalized abdominal pain   1. Upper GI bleeding - Pt for EGD today with Dr. Jenetta Downer. Continue IV protonix as ordered.  Holding aspirin for now.  Further recs to follow pending results.  2. Essential hypertension - resumed home medication 3. Gen abd pain - improved today.  Follow.  4. DNR - continue order while in hospital.   DVT prophylaxis:  SCDs  Code Status: DNR Family Communication: sister at bedside  Disposition:  Home with [name]   Status is:  Inpatient  Remains inpatient appropriate because:IV treatments appropriate due to intensity of illness or inability to take PO and Inpatient level of care appropriate due to severity of illness   Dispo: The patient is from: Home              Anticipated d/c is to: Home              Anticipated d/c date is: 1 day              Patient currently is not medically stable to d/c.         Consultants:   GI   Procedures:   EGD 12/10   Antimicrobials:  N/a    Subjective: Pt reports abd pain is better, no further rectal bleeding seen.   Objective: Vitals:   12/16/19 1150 12/16/19 1200 12/16/19 1301 12/16/19 1426  BP: 134/63 (!) 182/90 (!) 148/82 (!) 162/62  Pulse:  72 71 65  Resp:  (!) 26 16 16   Temp: 97.8 F (36.6 C)  97.9 F (36.6 C) 98 F (36.7 C)  TempSrc:   Oral Oral  SpO2: 96% 95% 96% 96%  Weight:      Height:        Intake/Output Summary (Last 24 hours) at 12/16/2019 1638 Last data filed at 12/16/2019 1257 Gross per 24 hour  Intake 2616.37 ml  Output 600 ml  Net 2016.37 ml   [employer]  12/15/19 0904 12/15/19 1820 12/16/19 1039  Weight: 63.5 kg 63.8 kg 63.8 kg    Examination:  General exam: Appears calm and comfortable  Respiratory system: Clear to auscultation. Respiratory effort normal. Cardiovascular system: S1 & S2 heard, RRR. No JVD, murmurs, rubs, gallops or clicks. No pedal edema. Gastrointestinal system: Abdomen is nondistended, soft and nontender. No organomegaly or masses felt. Normal bowel sounds heard. Central nervous system: Alert and oriented. No focal neurological deficits. Extremities: Symmetric 5 x 5 power. Skin: No rashes, lesions or ulcers Psychiatry: Judgement and insight appear normal. Mood & affect appropriate.   Data Reviewed: I have personally reviewed following labs and imaging studies  CBC: Recent Labs  Lab 12/15/19 0920 12/15/19 2142 12/16/19 0641  WBC 8.6  --  6.4  NEUTROABS 5.3  --   --   HGB 9.9* 8.3* 8.4*   HCT 33.7* 27.6* 28.1*  MCV 99.7  --  98.6  PLT 271  --  175    Basic Metabolic Panel: Recent Labs  Lab 12/15/19 0920 12/16/19 0641  NA 141 140  K 3.9 3.8  CL 106 106  CO2 28 26  GLUCOSE 102* 101*  BUN 16 10  CREATININE 0.62 0.51  CALCIUM 8.6* 8.4*    GFR: Estimated Creatinine Clearance: 44.3 mL/min (by C-G formula based on SCr of 0.51 mg/dL).  Liver Function Tests: Recent Labs  Lab 12/15/19 0920  AST 15  ALT 11  ALKPHOS 52  BILITOT 0.4  PROT 6.4*  ALBUMIN 3.5    CBG: No results for input(s): GLUCAP in the last 168 hours.  Recent Results (from the past 240 hour(s))  Resp Panel by RT-PCR (Flu A&B, Covid) Nasopharyngeal Swab     Status: None   Collection Time: 12/15/19  3:58 PM   Specimen: Nasopharyngeal Swab; Nasopharyngeal(NP) swabs in vial transport medium  Result Value Ref Range Status   SARS Coronavirus 2 by RT PCR NEGATIVE NEGATIVE Final    Comment: (NOTE) SARS-CoV-2 target nucleic acids are NOT DETECTED.  The SARS-CoV-2 RNA is generally detectable in upper respiratory specimens during the acute phase of infection. The lowest concentration of SARS-CoV-2 viral copies this assay can detect is 138 copies/mL. A negative result does not preclude SARS-Cov-2 infection and should not be used as the sole basis for treatment or other patient management decisions. A negative result may occur with  improper specimen collection/handling, submission of specimen other than nasopharyngeal swab, presence of viral mutation(s) within the areas targeted by this assay, and inadequate number of viral copies(<138 copies/mL). A negative result must be combined with clinical observations, patient history, and epidemiological information. The expected result is Negative.  Fact Sheet for Patients:  EntrepreneurPulse.com.au  Fact Sheet for Healthcare Providers:  IncredibleEmployment.be  This test is no t yet approved or cleared by the  [place] FDA and  has been authorized for detection and/or diagnosis of SARS-CoV-2 by FDA under an Emergency Use Authorization (EUA). This EUA will remain  in effect (meaning this test can be used) for the duration of the COVID-19 declaration under Section 564(b)(1) of the Act, 21 U.S.C.section 360bbb-3(b)(1), unless the authorization is terminated  or revoked sooner.       Influenza A by PCR NEGATIVE NEGATIVE Final   Influenza B by PCR NEGATIVE NEGATIVE Final    Comment: (NOTE) The Xpert Xpress SARS-CoV-2/FLU/RSV plus assay is intended as an aid in the diagnosis of influenza from Nasopharyngeal swab specimens and should not be used as a sole basis for treatment. Nasal  washings and aspirates are unacceptable for Xpert Xpress SARS-CoV-2/FLU/RSV testing.  Fact Sheet for Patients: EntrepreneurPulse.com.au  Fact Sheet for Healthcare Providers: IncredibleEmployment.be  This test is not yet approved or cleared by the [place] FDA and has been authorized for detection and/or diagnosis of SARS-CoV-2 by FDA under an Emergency Use Authorization (EUA). This EUA will remain in effect (meaning this test can be used) for the duration of the COVID-19 declaration under Section 564(b)(1) of the Act, 21 U.S.C. section 360bbb-3(b)(1), unless the authorization is terminated or revoked.  Performed at [facility], [address] [place], [place] [address]      Radiology Studies: CT ABDOMEN PELVIS W CONTRAST  Result Date: 12/15/2019 CLINICAL DATA:  Episodes of bloody stool EXAM: CT ABDOMEN AND PELVIS WITH CONTRAST TECHNIQUE: Multidetector CT imaging of the abdomen and pelvis was performed using the standard protocol following bolus administration of intravenous contrast. CONTRAST:  155mL OMNIPAQUE IOHEXOL 300 MG/ML  SOLN COMPARISON:  October 06, 2019 FINDINGS: Lower chest: No acute abnormality. Hepatobiliary: Stable cyst of the right hepatic lobe.  Layering increased density within the gallbladder may reflect sludge or calculus. No biliary dilatation. Pancreas: Unremarkable. Spleen: Unremarkable. Adrenals/Urinary Tract: Stable mildly nodular appearance of left adrenal. Bilateral renal cysts. Bladder is unremarkable. Stomach/Bowel: Stomach is within normal limits. Bowel is normal in caliber. Vascular/Lymphatic: Aortoiliac bypass graft. Extensive aortoiliac atherosclerosis. No enlarged lymph nodes identified. Reproductive: Multiple uterine calcifications again identified probably reflecting fibroids. No pelvic mass. Other: Wide defect fat containing ventral abdominal wall hernia above the umbilicus is unchanged. No ascites. Musculoskeletal: Advanced degenerative changes of the included spine. Degenerative changes of the hips. IMPRESSION: No acute abnormality. Electronically Signed   By: Macy Mis M.D.   On: 12/15/2019 10:52     Scheduled Meds: . Chlorhexidine Gluconate Cloth  6 each Topical Daily  . irbesartan  75 mg Oral Daily  . levothyroxine  50 mcg Oral Q0600  . metoprolol tartrate  25 mg Oral BID  . morphine  2 mg Intravenous Once  . pantoprazole (PROTONIX) IV  40 mg Intravenous Q12H  . raloxifene  60 mg Oral Daily  . sertraline  50 mg Oral Daily   Continuous Infusions: . sodium chloride 50 mL/hr at 12/16/19 1257     LOS: 1 day   Time spent: 76 mins   Caelum Federici Wynetta Emery, MD How to contact the [facility] Attending or Consulting provider [place] or covering provider during after hours [place], for this patient?  1. Check the care team in [facility] and look for a) attending/consulting TRH provider listed and b) the [facility] team listed 2. Log into [email or web address] and use [place]'s universal password to access. If you do not have the password, please contact the hospital operator. 3. Locate the [facility] provider you are looking for under [facility] and page to a number that you can be directly reached. 4. If you still have difficulty reaching  the provider, please page the [facility] (Director on Call) for the Hospitalists listed on amion for assistance.  12/16/2019, 4:38 PM

## 2019-12-16 NOTE — Plan of Care (Signed)

## 2019-12-17 ENCOUNTER — Encounter (HOSPITAL_COMMUNITY): Payer: Self-pay | Admitting: Family Medicine

## 2019-12-17 LAB — CBC
HCT: 25.4 % — ABNORMAL LOW (ref 36.0–46.0)
Hemoglobin: 7.5 g/dL — ABNORMAL LOW (ref 12.0–15.0)
MCH: 29.1 pg (ref 26.0–34.0)
MCHC: 29.5 g/dL — ABNORMAL LOW (ref 30.0–36.0)
MCV: 98.4 fL (ref 80.0–100.0)
Platelets: 217 10*3/uL (ref 150–400)
RBC: 2.58 MIL/uL — ABNORMAL LOW (ref 3.87–5.11)
RDW: 12.2 % (ref 11.5–15.5)
WBC: 6.8 10*3/uL (ref 4.0–10.5)
nRBC: 0 % (ref 0.0–0.2)

## 2019-12-17 LAB — URINALYSIS, ROUTINE W REFLEX MICROSCOPIC
Bilirubin Urine: NEGATIVE
Glucose, UA: NEGATIVE mg/dL
Hgb urine dipstick: NEGATIVE
Ketones, ur: NEGATIVE mg/dL
Leukocytes,Ua: NEGATIVE
Nitrite: NEGATIVE
Protein, ur: NEGATIVE mg/dL
Specific Gravity, Urine: 1.006 (ref 1.005–1.030)
pH: 6 (ref 5.0–8.0)

## 2019-12-17 MED ORDER — PANTOPRAZOLE SODIUM 40 MG PO TBEC
40.0000 mg | DELAYED_RELEASE_TABLET | Freq: Two times a day (BID) | ORAL | Status: DC
  Administered 2019-12-17 – 2019-12-18 (×2): 40 mg via ORAL
  Filled 2019-12-17 (×2): qty 1

## 2019-12-17 NOTE — Progress Notes (Signed)
PROGRESS NOTE   Lauren Morrow  KDT:[id number] DOB: 1924/03/09 DOA: 12/15/2019 PCP: Glenda Chroman, MD   Chief Complaint  Patient presents with  . Rectal Bleeding    Brief Admission History:   86 y.o. female with medical history significant for remote history of postoperative atrial fibrillation has only been on full strength aspirin daily for anticoagulation since October 2021 and history of falls, aortic stenoss, s/p valvular replacement, PVD, GERD, HTN, dizziness reports that she started having generalized abdominal pain and bright red rectal bleeding yesterday where she described red blood mixed with black mushy stools.  She says that she had another incident happen earlier this morning around 4am with similar presentation of red blood mixed with her mushy stool.  She called her sister and her PCP and was advised to come to the ED for further evaluation and GI consultation.  She reported that she had a bout of GI bleeding about 1 year ago but since that time has had no issues up until yesterday.  She has been having dizziness for the past few days.  No recent falls.  No dysuria. No emesis.  No loss of appetite. No chest pain and no shortness of breath.    Assessment & Plan:   Principal Problem:   Bright red rectal bleeding Active Problems:   Aortic stenosis   Hypertension   Atrial fibrillation (HCC)   Long term (current) use of anticoagulants   S/P AVR   Acute blood loss anemia   S/P AAA repair   Heme positive stool   Generalized abdominal pain  1. Upper GI bleeding - Pt had EGD 12/10 with Dr. Jenetta Downer with findings of normal esophagus, healing erosions in stomach, normal duodenum. Continue protonix BID.  Holding aspirin for now.  Discussed with sister that I recommended holding aspirin.  Sister spoke with PCP who recommended aspirin 81 mg.  2. Acute blood loss anemia - Hg down to 7.5 today likely hemodilution. Reduce IV fluid, recheck in AM.   3. Essential hypertension - resumed  home medication 4. Gen abd pain - improved today.  Follow.  5. DNR - continue order while in hospital.   DVT prophylaxis:  SCDs  Code Status: DNR Family Communication: sister at bedside  Disposition:  Home with [place]   Status is: Inpatient  Remains inpatient appropriate because:IV treatments appropriate due to intensity of illness or inability to take PO and Inpatient level of care appropriate due to severity of illness  Dispo: The patient is from: Home              Anticipated d/c is to: Home              Anticipated d/c date is: 1 day              Patient currently is not medically stable to d/c.  Consultants:   GI   Procedures:   EGD 12/10   Antimicrobials:  N/a    Subjective: Pt says she is feeling well.   Objective: Vitals:   12/16/19 2046 12/17/19 0504 12/17/19 0822 12/17/19 1452  BP: (!) 147/56 (!) 111/53 (!) 189/85 124/63  Pulse: 66 (!) 57 64 64  Resp: 16 16  16   Temp: 98.2 F (36.8 C) 98.6 F (37 C)  98.2 F (36.8 C)  TempSrc: Oral   Oral  SpO2: 93% 93%  95%  Weight:      Height:        Intake/Output Summary (Last 24 hours) at  12/17/2019 1556 Last data filed at 12/17/2019 2992 Gross per 24 hour  Intake 720 ml  Output --  Net 720 ml   [employer]   12/15/19 0904 12/15/19 1820 12/16/19 1039  Weight: 63.5 kg 63.8 kg 63.8 kg    Examination:  General exam: elderly frail female, Appears calm and comfortable  Respiratory system: Clear to auscultation. Respiratory effort normal. Cardiovascular system: normal S1 & S2 heard. No JVD, murmurs, rubs, gallops or clicks. No pedal edema. Gastrointestinal system: Abdomen is nondistended, soft and nontender. No organomegaly or masses felt. Normal bowel sounds heard. Central nervous system: Alert and oriented. No focal neurological deficits. Extremities: Symmetric 5 x 5 power. Skin: No rashes, lesions or ulcers Psychiatry: Judgement and insight appear normal. Mood & affect appropriate.   Data Reviewed: I  have personally reviewed following labs and imaging studies  CBC: Recent Labs  Lab 12/15/19 0920 12/15/19 2142 12/16/19 0641 12/17/19 0436  WBC 8.6  --  6.4 6.8  NEUTROABS 5.3  --   --   --   HGB 9.9* 8.3* 8.4* 7.5*  HCT 33.7* 27.6* 28.1* 25.4*  MCV 99.7  --  98.6 98.4  PLT 271  --  235 426    Basic Metabolic Panel: Recent Labs  Lab 12/15/19 0920 12/16/19 0641  NA 141 140  K 3.9 3.8  CL 106 106  CO2 28 26  GLUCOSE 102* 101*  BUN 16 10  CREATININE 0.62 0.51  CALCIUM 8.6* 8.4*    GFR: Estimated Creatinine Clearance: 44.3 mL/min (by C-G formula based on SCr of 0.51 mg/dL).  Liver Function Tests: Recent Labs  Lab 12/15/19 0920  AST 15  ALT 11  ALKPHOS 52  BILITOT 0.4  PROT 6.4*  ALBUMIN 3.5    CBG: No results for input(s): GLUCAP in the last 168 hours.  Recent Results (from the past 240 hour(s))  Resp Panel by RT-PCR (Flu A&B, Covid) Nasopharyngeal Swab     Status: None   Collection Time: 12/15/19  3:58 PM   Specimen: Nasopharyngeal Swab; Nasopharyngeal(NP) swabs in vial transport medium  Result Value Ref Range Status   SARS Coronavirus 2 by RT PCR NEGATIVE NEGATIVE Final    Comment: (NOTE) SARS-CoV-2 target nucleic acids are NOT DETECTED.  The SARS-CoV-2 RNA is generally detectable in upper respiratory specimens during the acute phase of infection. The lowest concentration of SARS-CoV-2 viral copies this assay can detect is 138 copies/mL. A negative result does not preclude SARS-Cov-2 infection and should not be used as the sole basis for treatment or other patient management decisions. A negative result may occur with  improper specimen collection/handling, submission of specimen other than nasopharyngeal swab, presence of viral mutation(s) within the areas targeted by this assay, and inadequate number of viral copies(<138 copies/mL). A negative result must be combined with clinical observations, patient history, and epidemiological information. The  expected result is Negative.  Fact Sheet for Patients:  EntrepreneurPulse.com.au  Fact Sheet for Healthcare Providers:  IncredibleEmployment.be  This test is no t yet approved or cleared by the [place] FDA and  has been authorized for detection and/or diagnosis of SARS-CoV-2 by FDA under an Emergency Use Authorization (EUA). This EUA will remain  in effect (meaning this test can be used) for the duration of the COVID-19 declaration under Section 564(b)(1) of the Act, 21 U.S.C.section 360bbb-3(b)(1), unless the authorization is terminated  or revoked sooner.       Influenza A by PCR NEGATIVE NEGATIVE Final   Influenza B  by PCR NEGATIVE NEGATIVE Final    Comment: (NOTE) The Xpert Xpress SARS-CoV-2/FLU/RSV plus assay is intended as an aid in the diagnosis of influenza from Nasopharyngeal swab specimens and should not be used as a sole basis for treatment. Nasal washings and aspirates are unacceptable for Xpert Xpress SARS-CoV-2/FLU/RSV testing.  Fact Sheet for Patients: EntrepreneurPulse.com.au  Fact Sheet for Healthcare Providers: IncredibleEmployment.be  This test is not yet approved or cleared by the [place] FDA and has been authorized for detection and/or diagnosis of SARS-CoV-2 by FDA under an Emergency Use Authorization (EUA). This EUA will remain in effect (meaning this test can be used) for the duration of the COVID-19 declaration under Section 564(b)(1) of the Act, 21 U.S.C. section 360bbb-3(b)(1), unless the authorization is terminated or revoked.  Performed at [facility], [address], [place], [place] [address]      Radiology Studies: No results found.  Scheduled Meds: . Chlorhexidine Gluconate Cloth  6 each Topical Daily  . irbesartan  75 mg Oral Daily  . levothyroxine  50 mcg Oral Q0600  . metoprolol tartrate  25 mg Oral BID  . morphine  2 mg Intravenous Once  .  pantoprazole (PROTONIX) IV  40 mg Intravenous Q12H  . raloxifene  60 mg Oral Daily  . sertraline  50 mg Oral Daily   Continuous Infusions: . sodium chloride 10 mL/hr at 12/17/19 0817     LOS: 2 days   Time spent: 35 mins  Kodee Ravert Wynetta Emery, MD How to contact the [facility] Attending or Consulting provider [place] or covering provider during after hours [place], for this patient?  1. Check the care team in [facility] and look for a) attending/consulting TRH provider listed and b) the [facility] team listed 2. Log into [email or web address] and use [place]'s universal password to access. If you do not have the password, please contact the hospital operator. 3. Locate the [facility] provider you are looking for under [facility] and page to a number that you can be directly reached. 4. If you still have difficulty reaching the provider, please page the [facility] (Director on Call) for the Hospitalists listed on amion for assistance.  12/17/2019, 3:56 PM

## 2019-12-17 NOTE — Plan of Care (Signed)
  Problem: Acute Rehab PT Goals(only PT should resolve) Goal: Pt will Roll Supine to Side Outcome: Progressing Flowsheets (Taken 12/17/2019 1136) Pt will Roll Supine to Side: min guard Goal: Pt Will Go Supine/Side To Sit Outcome: Progressing Flowsheets (Taken 12/17/2019 1136) Pt will go Supine/Side to Sit: with minimal assist Goal: Pt Will Go Sit To Supine/Side Outcome: Progressing Flowsheets (Taken 12/17/2019 1136) Pt will go Sit to Supine/Side: with minimal assist Goal: Patient Will Transfer Sit To/From Stand Outcome: Progressing Flowsheets (Taken 12/17/2019 1136) Patient will transfer sit to/from stand: with min guard assist Goal: Pt Will Transfer Bed To Chair/Chair To Bed Outcome: Progressing Flowsheets (Taken 12/17/2019 1136) Pt will Transfer Bed to Chair/Chair to Bed: min guard assist Goal: Pt Will Ambulate Outcome: Progressing Flowsheets (Taken 12/17/2019 1136) Pt will Ambulate:  > 125 feet  with min guard assist  with least restrictive assistive device   Pamala Hurry D. Hartnett-Rands, MS, PT Per [place] [phone] 12/17/2019

## 2019-12-17 NOTE — Evaluation (Signed)
Physical Therapy Evaluation Patient Details Name: Lauren Morrow MRN: [id number] DOB: 11-04-1924 Today's Date: 12/17/2019   History of Present Illness  86 y.o. female with medical history significant for remote history of postoperative atrial fibrillation has only been on full strength aspirin daily for anticoagulation since October 2021 and history of falls, aortic stenoss, s/p valvular replacement, PVD, GERD, HTN, dizziness reports that she started having generalized abdominal pain and bright red rectal bleeding yesterday where she described red blood mixed with black mushy stools.  She says that she had another incident happen earlier this morning around 4am with similar presentation of red blood mixed with her mushy stool.  She called her sister and her PCP and was advised to come to the ED for further evaluation and GI consultation.  She reported that she had a bout of GI bleeding about 1 year ago but since that time has had no issues up until yesterday.  She has been having dizziness for the past few days. No dysuria. No emesis.  No loss of appetite. No chest pain and no shortness of breath.    Clinical Impression  Pt admitted with above diagnosis. Patient agreeable to participating in PT evaluation. Patient reports she fell in September and was receiving HHPT prior to admission. Patient reports her younger sister helps her with IADLs. Patient's personal SPC in room. Patient unsteady and reaching for objects upon standing with SPC. Patient required bedrails and HOB elevated for bed mobility. Patient reports she grabs onto the side of her mattress to sit up out of bed at home. Patient ambulated with RW 150 feet with min guard assist and cues to walk within the RW base of support. Patient safety aware. Pt currently with functional limitations due to the deficits listed below (see PT Problem List). Pt will benefit from skilled PT to increase their independence and safety with mobility to allow  discharge to the venue listed below.       Follow Up Recommendations Home health PT;Supervision - Intermittent;Supervision for mobility/OOB    Equipment Recommendations  None recommended by PT;Other (comment) (recommend HHPT assess patient's safety with Rollator vs RW in the home.)    Recommendations for Other Services       Precautions / Restrictions Precautions Precautions: Fall Precaution Comments: patient reports a fall in September Restrictions Weight Bearing Restrictions: No      Mobility  Bed Mobility Overal bed mobility: Needs Assistance Bed Mobility: Supine to Sit     Supine to sit: Min guard;HOB elevated     General bed mobility comments: heavy use of bedrail; patient in recliner at end of session    Transfers Overall transfer level: Needs assistance Equipment used: Rolling walker (2 wheeled);Straight cane Transfers: Sit to/from [facility] Sit to Stand: Min guard Stand pivot transfers: Min guard       General transfer comment: with SPC, patient reaching for objects with opposite hand to steady; more steady with RW; cues for sequencing of steps and placement of hands  Ambulation/Gait Ambulation/Gait assistance: Min guard Gait Distance (Feet): 150 Feet Assistive device: Rolling walker (2 wheeled) Gait Pattern/deviations: Step-through pattern;Decreased step length - right;Decreased step length - left;Decreased stride length;Trunk flexed Gait velocity: decreased   General Gait Details: somewhat slow labored gait with RW, on room air, cues to walk within base of support with more erect posture; limited by fatigue  Stairs    Wheelchair Mobility    Modified Rankin (Stroke Patients Only)       Balance  Overall balance assessment: Needs assistance Sitting-balance support: Feet unsupported;Single extremity supported Sitting balance-Leahy Scale: Fair Sitting balance - Comments: cues to place feet on floor to steady seated position and  for more erect posture Postural control: Posterior lean Standing balance support: Single extremity supported Standing balance-Leahy Scale: Poor Standing balance comment: with SPC, reaching for objects with opposite hand; fair with RW         Pertinent Vitals/Pain Pain Assessment: No/denies pain    Home Living Family/patient expects to be discharged to:: Private residence Living Arrangements: Alone Available Help at Discharge: Family;Other (Comment) (younger sister) Type of Home: House Home Access: Stairs to enter Entrance Stairs-Rails: Psychiatric nurse of Steps: 2-3 Home Layout: One level Home Equipment: Shower seat;Hand held shower head;Bedside commode;Grab bars - toilet;Cane - single point;Walker - 4 wheels;Grab bars - tub/shower      Prior Function Level of Independence: Needs assistance   Gait / Transfers Assistance Needed: ambulated with SPC household distances during the day; used Rollator at night to go to the bathroom.  ADL's / Homemaking Assistance Needed: independent with BADLs; assistance from younger sister for IADLs        Hand Dominance   Dominant Hand: Right    Extremity/Trunk Assessment   Upper Extremity Assessment Upper Extremity Assessment: Generalized weakness    Lower Extremity Assessment Lower Extremity Assessment: Generalized weakness       Communication   Communication: HOH  Cognition Arousal/Alertness: Awake/alert Behavior During Therapy: WFL for tasks assessed/performed Overall Cognitive Status: Within Functional Limits for tasks assessed         General Comments      Exercises     Assessment/Plan    PT Assessment Patient needs continued PT services  PT Problem List Decreased strength;Decreased activity tolerance;Decreased balance;Decreased mobility;Decreased knowledge of use of DME       PT Treatment Interventions DME instruction;Balance training;Gait training;Neuromuscular re-education;Stair  training;Patient/family education;Therapeutic activities;Therapeutic exercise;Functional mobility training    PT Goals (Current goals can be found in the Care Plan section)  Acute Rehab PT Goals Patient Stated Goal: Go home and continue with HHPT. PT Goal Formulation: With patient Time For Goal Achievement: 12/31/19 Potential to Achieve Goals: Good    Frequency Min 3X/week   Barriers to discharge           AM-PAC PT "6 Clicks" Mobility  Outcome Measure Help needed turning from your back to your side while in a flat bed without using bedrails?: A Little Help needed moving from lying on your back to sitting on the side of a flat bed without using bedrails?: A Lot Help needed moving to and from a bed to a chair (including a wheelchair)?: A Little Help needed standing up from a chair using your arms (e.g., wheelchair or bedside chair)?: A Little Help needed to walk in hospital room?: A Little Help needed climbing 3-5 steps with a railing? : A Lot 6 Click Score: 16    End of Session Equipment Utilized During Treatment: Gait belt Activity Tolerance: Patient tolerated treatment well;Patient limited by fatigue Patient left: in chair;with call bell/phone within reach Nurse Communication: Mobility status PT Visit Diagnosis: Unsteadiness on feet (R26.81);History of falling (Z91.81);Muscle weakness (generalized) (M62.81);Other abnormalities of gait and mobility (R26.89)    Time: 1150-1220 PT Time Calculation (min) (ACUTE ONLY): 30 min   Charges:   PT Evaluation $PT Eval Low Complexity: 1 Low PT Treatments $Therapeutic Activity: 8-22 mins        Floria Raveling. Hartnett-Rands, MS, PT Per [employer]  [place] [phone] 12/17/2019, 11:32 AM

## 2019-12-18 LAB — CBC
HCT: 28.2 % — ABNORMAL LOW (ref 36.0–46.0)
Hemoglobin: 8.2 g/dL — ABNORMAL LOW (ref 12.0–15.0)
MCH: 28.5 pg (ref 26.0–34.0)
MCHC: 29.1 g/dL — ABNORMAL LOW (ref 30.0–36.0)
MCV: 97.9 fL (ref 80.0–100.0)
Platelets: 239 10*3/uL (ref 150–400)
RBC: 2.88 MIL/uL — ABNORMAL LOW (ref 3.87–5.11)
RDW: 12.4 % (ref 11.5–15.5)
WBC: 7.8 10*3/uL (ref 4.0–10.5)
nRBC: 0 % (ref 0.0–0.2)

## 2019-12-18 MED ORDER — PANTOPRAZOLE SODIUM 40 MG PO TBEC: 40.0000 mg | DELAYED_RELEASE_TABLET | Freq: Two times a day (BID) | ORAL | 1 refills | Status: AC

## 2019-12-18 NOTE — TOC Transition Note (Signed)
Transition of Care [facility]) - CM/SW Discharge Note   Patient Details  Name: Lauren Morrow MRN: [id number] Date of Birth: 21-Nov-1924  [facility] CM/SW Contact:  Shade Flood, LCSW Phone Number: 12/18/2019, 10:41 AM   Clinical Narrative:     Pt stable for dc home today per MD. Plan remains for return home with resumption of [facility] RN and PT from [facility]. Notified Jason from [facility] of pt's dc.  No other TOC needs for dc.  Final next level of care: [place] Barriers to Discharge: Barriers Resolved   Patient Goals and CMS Choice        Discharge Placement                       Discharge Plan and Services                                     Social Determinants of Health (SDOH) Interventions     Readmission Risk Interventions No flowsheet data found.

## 2019-12-18 NOTE — Discharge Instructions (Signed)
Please take protonix twice daily for 2 months then once daily afterwards   Please hold all aspirin and NSAIDS and discuss risks versus benefits of restarting aspirin with primary care provider and cardiologist.     Gastrointestinal Bleeding Gastrointestinal (GI) bleeding is bleeding somewhere along the path that food travels through the body (digestive tract). This path is anywhere between the mouth and the opening of the butt (anus). You may have blood in your poop (stool) or have black poop. If you throw up (vomit), there may be blood in it. This condition can be mild, serious, or even life-threatening. If you have a lot of bleeding, you may need to stay in the hospital. What are the causes? This condition may be caused by:  Irritation and swelling of the esophagus (esophagitis). The esophagus is part of the body that moves food from your mouth to your stomach.  Swollen veins in the butt (hemorrhoids).  Areas of painful tearing in the opening of the butt (anal fissures). These are often caused by passing hard poop.  Pouches that form on the colon over time (diverticulosis).  Irritation and swelling (diverticulitis) in areas where pouches have formed on the colon.  Growths (polyps) or cancer. Colon cancer often starts out as growths that are not cancer.  Irritation of the stomach lining (gastritis).  Sores (ulcers) in the stomach. What increases the risk? You are more likely to develop this condition if you:  Have a certain type of infection in your stomach (Helicobacter pylori infection).  Take certain medicines.  Smoke.  Drink alcohol. What are the signs or symptoms? Common symptoms of this condition include:  Throwing up (vomiting) material that has bright red blood in it. It may look like coffee grounds.  Changes in your poop. The poop may: ? Have red blood in it. ? Be black, look like tar, and smell stronger than normal. ? Be red.  Pain or cramping in the belly  (abdomen). How is this treated? Treatment for this condition depends on the cause of the bleeding. For example:  Sometimes, the bleeding can be stopped during a procedure that is done to find the problem (endoscopy or colonoscopy).  Medicines can be used to: ? Help control irritation, swelling, or infection. ? Reduce acid in your stomach.  Certain problems can be treated with: ? Creams. ? Medicines that are put in the butt (suppositories). ? Warm baths.  Surgery is sometimes needed.  If you lose a lot of blood, you may need a blood transfusion. If bleeding is mild, you may be allowed to go home. If there is a lot of bleeding, you will need to stay in the hospital. Follow these instructions at home:   Take over-the-counter and prescription medicines only as told by your doctor.  Eat foods that have a lot of fiber in them. These foods include beans, whole grains, and fresh fruits and vegetables. You can also try eating 1-3 prunes each day.  Drink enough fluid to keep your pee (urine) pale yellow.  Keep all follow-up visits as told by your doctor. This is important. Contact a doctor if:  Your symptoms do not get better. Get help right away if:  Your bleeding does not stop.  You feel dizzy or you pass out (faint).  You feel weak.  You have very bad cramps in your back or belly.  You pass large clumps of blood (clots) in your poop.  Your symptoms are getting worse.  You have chest pain or  fast heartbeats. Summary  GI bleeding is bleeding somewhere along the path that food travels through the body (digestive tract).  This bleeding can be caused by many things. Treatment depends on the cause of the bleeding.  Take medicines only as told by your doctor.  Keep all follow-up visits as told by your doctor. This is important. This information is not intended to replace advice given to you by your health care provider. Make sure you discuss any questions you have with your  health care provider. Document Revised: 08/05/2017 Document Reviewed: 08/05/2017 Elsevier Patient Education  [place].   IMPORTANT INFORMATION: PAY CLOSE ATTENTION   PHYSICIAN DISCHARGE INSTRUCTIONS  Follow with Primary care provider  Glenda Chroman, MD  and other consultants as instructed by your Hospitalist Physician  [place] IF SYMPTOMS COME BACK, WORSEN OR NEW PROBLEM DEVELOPS   Please note: You were cared for by a hospitalist during your hospital stay. Every effort will be made to forward records to your primary care provider.  You can request that your primary care provider send for your hospital records if they have not received them.  Once you are discharged, your primary care physician will handle any further medical issues. Please note that NO REFILLS for any discharge medications will be authorized once you are discharged, as it is imperative that you return to your primary care physician (or establish a relationship with a primary care physician if you do not have one) for your post hospital discharge needs so that they can reassess your need for medications and monitor your lab values.  Please get a complete blood count and chemistry panel checked by your Primary MD at your next visit, and again as instructed by your Primary MD.  Get Medicines reviewed and adjusted: Please take all your medications with you for your next visit with your Primary MD  Laboratory/radiological data: Please request your Primary MD to go over all hospital tests and procedure/radiological results at the follow up, please ask your primary care provider to get all Hospital records sent to his/her office.  In some cases, they will be blood work, cultures and biopsy results pending at the time of your discharge. Please request that your primary care provider follow up on these results.  If you are diabetic, please bring your blood sugar readings with you to  your follow up appointment with primary care.    Please call and make your follow up appointments as soon as possible.    Also Note the following: If you experience worsening of your admission symptoms, develop shortness of breath, life threatening emergency, suicidal or homicidal thoughts you must seek medical attention immediately by calling 911 or calling your MD immediately  if symptoms less severe.  You must read complete instructions/literature along with all the possible adverse reactions/side effects for all the Medicines you take and that have been prescribed to you. Take any new Medicines after you have completely understood and accpet all the possible adverse reactions/side effects.   Do not drive when taking Pain medications or sleeping medications (Benzodiazepines)  Do not take more than prescribed Pain, Sleep and Anxiety Medications. It is not advisable to combine anxiety,sleep and pain medications without talking with your primary care practitioner  Special Instructions: If you have smoked or chewed Tobacco  in the last 2 yrs please stop smoking, stop any regular Alcohol  and or any Recreational drug use.  Wear Seat belts while driving.  Do not drive if taking any narcotic, mind altering or controlled substances or recreational drugs or alcohol.

## 2019-12-18 NOTE — Plan of Care (Signed)

## 2019-12-18 NOTE — Discharge Summary (Signed)
Physician Discharge Summary  Lauren Morrow ZOX:[id number] DOB: Jun 05, 1924 DOA: 12/15/2019  PCP: Glenda Chroman, MD  Admit date: 12/15/2019 Discharge date: 12/18/2019  Admitted From:  Home  Disposition:  Home with [facility]   Recommendations for Outpatient Follow-up:  1. Follow up with PCP in 1 weeks 2. Please obtain CBC in 1 week to follow up hemoglobin 3. Ongoing discussion with patient/family regarding risks vs benefits of aspirin  Home Health: PT, RN,    Discharge Condition: STABLE  CODE STATUS: DNR    Brief Hospitalization Summary: Please see all hospital notes, images, labs for full details of the hospitalization. ADMISSION HPI: 86 y.o. female with medical history significant for remote history of postoperative atrial fibrillation has only been on full strength aspirin daily for anticoagulation since October 2021 and history of falls, aortic stenoss, s/p valvular replacement, PVD, GERD, HTN, dizziness reports that she started having generalized abdominal pain and bright red rectal bleeding yesterday where she described red blood mixed with black mushy stools.  She says that she had another incident happen earlier this morning around 4am with similar presentation of red blood mixed with her mushy stool.  She called her sister and her PCP and was advised to come to the ED for further evaluation and GI consultation.  She reported that she had a bout of GI bleeding about 1 year ago but since that time has had no issues up until yesterday.  She has been having dizziness for the past few days.  No recent falls.  No dysuria. No emesis.  No loss of appetite. No chest pain and no shortness of breath.    ED Course: Pt reports she did not take any of her meds today prior to arrival.  She was hypertensive on arrival with a BPo f 178/79, pulse 75, R 20, pulse ox 97% on room air.  She was hemoccult positive.  Hg was down to 9.9.  Her prior Hg was 14.  She had normal platelets and WBC.  Her CMP was within  normal limits.  GI was consulted and recommended that she be admitted for further GI work up for her acute rectal bleed.   I confirmed with patient and her sister that she is DNR.    Hospital Course  1. Upper GI bleeding - Pt had EGD 12/10 with Dr. Jenetta Downer with findings of normal esophagus, healing erosions in stomach, normal duodenum. Continue protonix BID.  Holding aspirin for now.  Discussed with sister that I recommended holding aspirin.  Sister spoke with PCP who recommended aspirin 81 mg.  I asked patient to hold until she follows up with PCP and discuss risks versus benefit of taking aspirin in the setting of GI bleed.  2. Acute blood loss anemia - Hg improved today to 8.2.  Pt says no further bleeding has been seen.   3. Essential hypertension - resumed home medication 4. Gen abd pain - improved.   5. DNR - continue order while in hospital.   DVT prophylaxis:  SCDs  Code Status: DNR Family Communication: sister at bedside updated 12/11 Disposition:  Home with [facility]   Discharge Diagnoses:  Principal Problem:   Bright red rectal bleeding Active Problems:   Aortic stenosis   Hypertension   Atrial fibrillation (HCC)   Long term (current) use of anticoagulants   S/P AVR   Acute blood loss anemia   S/P AAA repair   Heme positive stool   Generalized abdominal pain   Discharge Instructions:  Allergies  as of 12/18/2019      Reactions   Avelox [moxifloxacin Hcl In Nacl] Rash   Avelox began at approximately 0800. At 0807 pt noted to have red-streaking proximal to the PIV site, up the left arm, to the neck/chest area. Avelox immediately discontinued. Pt received about 1/4 the dose or 100mg . Vital signs remained stable. The anesthesiologist and surgeon were notified and shown the site of red, rashy streaking.   Penicillins Swelling   Sulfa Antibiotics Other (See Comments)   unknown      Medication List    STOP taking these medications   aspirin 325 MG EC tablet   sodium  chloride 1 g tablet     TAKE these medications   fish oil-omega-3 fatty acids 1000 MG capsule Take 1 g by mouth daily.   Garlic 4854 MG Caps Take 1,000 mg by mouth daily.   levothyroxine 50 MCG tablet Commonly known as: SYNTHROID Take 50 mcg by mouth daily before breakfast.   metoprolol tartrate 25 MG tablet Commonly known as: LOPRESSOR TAKE ONE TABLET BY MOUTH TWICE DAILY   multivitamin tablet Take 1 tablet by mouth daily.   pantoprazole 40 MG tablet Commonly known as: PROTONIX Take 1 tablet (40 mg total) by mouth 2 (two) times daily.   polyethylene glycol powder 17 GM/SCOOP powder Commonly known as: GLYCOLAX/MIRALAX Take 17 g by mouth daily.   PreserVision AREDS 2 Caps Take 1 capsule by mouth daily.   raloxifene 60 MG tablet Commonly known as: EVISTA Take 60 mg by mouth daily.   sertraline 50 MG tablet Commonly known as: ZOLOFT Take 50 mg by mouth daily.   valsartan 80 MG tablet Commonly known as: DIOVAN Take 80 mg by mouth daily.       Follow-up Information    [facility].   Specialty: Home Health Services Why: PT, RN       Glenda Chroman, MD. Schedule an appointment as soon as possible for a visit in 1 week(s).   Specialty: Internal Medicine Contact information: [place] [address] [phone]        Arnoldo Lenis, MD Follow up.   Specialty: Cardiology Contact information: [place] [address] [phone]              Allergies  Allergen Reactions  . Avelox [Moxifloxacin Hcl In Nacl] Rash    Avelox began at approximately 0800. At 0807 pt noted to have red-streaking proximal to the PIV site, up the left arm, to the neck/chest area. Avelox immediately discontinued. Pt received about 1/4 the dose or 100mg . Vital signs remained stable. The anesthesiologist and surgeon were notified and shown the site of red, rashy streaking.  Marland Kitchen Penicillins Swelling  . Sulfa  Antibiotics Other (See Comments)    unknown   Allergies as of 12/18/2019      Reactions   Avelox [moxifloxacin Hcl In Nacl] Rash   Avelox began at approximately 0800. At 0807 pt noted to have red-streaking proximal to the PIV site, up the left arm, to the neck/chest area. Avelox immediately discontinued. Pt received about 1/4 the dose or 100mg . Vital signs remained stable. The anesthesiologist and surgeon were notified and shown the site of red, rashy streaking.   Penicillins Swelling   Sulfa Antibiotics Other (See Comments)   unknown      Medication List    STOP taking these medications   aspirin 325 MG EC tablet  sodium chloride 1 g tablet     TAKE these medications   fish oil-omega-3 fatty acids 1000 MG capsule Take 1 g by mouth daily.   Garlic 7106 MG Caps Take 1,000 mg by mouth daily.   levothyroxine 50 MCG tablet Commonly known as: SYNTHROID Take 50 mcg by mouth daily before breakfast.   metoprolol tartrate 25 MG tablet Commonly known as: LOPRESSOR TAKE ONE TABLET BY MOUTH TWICE DAILY   multivitamin tablet Take 1 tablet by mouth daily.   pantoprazole 40 MG tablet Commonly known as: PROTONIX Take 1 tablet (40 mg total) by mouth 2 (two) times daily.   polyethylene glycol powder 17 GM/SCOOP powder Commonly known as: GLYCOLAX/MIRALAX Take 17 g by mouth daily.   PreserVision AREDS 2 Caps Take 1 capsule by mouth daily.   raloxifene 60 MG tablet Commonly known as: EVISTA Take 60 mg by mouth daily.   sertraline 50 MG tablet Commonly known as: ZOLOFT Take 50 mg by mouth daily.   valsartan 80 MG tablet Commonly known as: DIOVAN Take 80 mg by mouth daily.       Procedures/Studies: CT ABDOMEN PELVIS W CONTRAST  Result Date: 12/15/2019 CLINICAL DATA:  Episodes of bloody stool EXAM: CT ABDOMEN AND PELVIS WITH CONTRAST TECHNIQUE: Multidetector CT imaging of the abdomen and pelvis was performed using the standard protocol following bolus administration of  intravenous contrast. CONTRAST:  141mL OMNIPAQUE IOHEXOL 300 MG/ML  SOLN COMPARISON:  October 06, 2019 FINDINGS: Lower chest: No acute abnormality. Hepatobiliary: Stable cyst of the right hepatic lobe. Layering increased density within the gallbladder may reflect sludge or calculus. No biliary dilatation. Pancreas: Unremarkable. Spleen: Unremarkable. Adrenals/Urinary Tract: Stable mildly nodular appearance of left adrenal. Bilateral renal cysts. Bladder is unremarkable. Stomach/Bowel: Stomach is within normal limits. Bowel is normal in caliber. Vascular/Lymphatic: Aortoiliac bypass graft. Extensive aortoiliac atherosclerosis. No enlarged lymph nodes identified. Reproductive: Multiple uterine calcifications again identified probably reflecting fibroids. No pelvic mass. Other: Wide defect fat containing ventral abdominal wall hernia above the umbilicus is unchanged. No ascites. Musculoskeletal: Advanced degenerative changes of the included spine. Degenerative changes of the hips. IMPRESSION: No acute abnormality. Electronically Signed   By: Macy Mis M.D.   On: 12/15/2019 10:52      Subjective: Pt says that she feels well today.   Discharge Exam: Vitals:   12/17/19 2100 12/18/19 0300  BP: (!) 164/60 127/60  Pulse: 64 63  Resp: 16 16  Temp: 98.3 F (36.8 C) 98.2 F (36.8 C)  SpO2: 93% 95%   Vitals:   12/17/19 0822 12/17/19 1452 12/17/19 2100 12/18/19 0300  BP: (!) 189/85 124/63 (!) 164/60 127/60  Pulse: 64 64 64 63  Resp:  16 16 16   Temp:  98.2 F (36.8 C) 98.3 F (36.8 C) 98.2 F (36.8 C)  TempSrc:  Oral Oral Oral  SpO2:  95% 93% 95%  Weight:      Height:       General: Pt is alert, awake, not in acute distress Cardiovascular: RRR, S1/S2 +, no rubs, no gallops Respiratory: CTA bilaterally, no wheezing, no rhonchi Abdominal: Soft, NT, ND, bowel sounds + Extremities: no edema, no cyanosis   The results of significant diagnostics from this hospitalization (including imaging,  microbiology, ancillary and laboratory) are listed below for reference.     Microbiology: Recent Results (from the past 240 hour(s))  Resp Panel by RT-PCR (Flu A&B, Covid) Nasopharyngeal Swab     Status: None   Collection Time: 12/15/19  3:58 PM  Specimen: Nasopharyngeal Swab; Nasopharyngeal(NP) swabs in vial transport medium  Result Value Ref Range Status   SARS Coronavirus 2 by RT PCR NEGATIVE NEGATIVE Final    Comment: (NOTE) SARS-CoV-2 target nucleic acids are NOT DETECTED.  The SARS-CoV-2 RNA is generally detectable in upper respiratory specimens during the acute phase of infection. The lowest concentration of SARS-CoV-2 viral copies this assay can detect is 138 copies/mL. A negative result does not preclude SARS-Cov-2 infection and should not be used as the sole basis for treatment or other patient management decisions. A negative result may occur with  improper specimen collection/handling, submission of specimen other than nasopharyngeal swab, presence of viral mutation(s) within the areas targeted by this assay, and inadequate number of viral copies(<138 copies/mL). A negative result must be combined with clinical observations, patient history, and epidemiological information. The expected result is Negative.  Fact Sheet for Patients:  EntrepreneurPulse.com.au  Fact Sheet for Healthcare Providers:  IncredibleEmployment.be  This test is no t yet approved or cleared by the [place] FDA and  has been authorized for detection and/or diagnosis of SARS-CoV-2 by FDA under an Emergency Use Authorization (EUA). This EUA will remain  in effect (meaning this test can be used) for the duration of the COVID-19 declaration under Section 564(b)(1) of the Act, 21 U.S.C.section 360bbb-3(b)(1), unless the authorization is terminated  or revoked sooner.       Influenza A by PCR NEGATIVE NEGATIVE Final   Influenza B by PCR NEGATIVE NEGATIVE  Final    Comment: (NOTE) The Xpert Xpress SARS-CoV-2/FLU/RSV plus assay is intended as an aid in the diagnosis of influenza from Nasopharyngeal swab specimens and should not be used as a sole basis for treatment. Nasal washings and aspirates are unacceptable for Xpert Xpress SARS-CoV-2/FLU/RSV testing.  Fact Sheet for Patients: EntrepreneurPulse.com.au  Fact Sheet for Healthcare Providers: IncredibleEmployment.be  This test is not yet approved or cleared by the [place] FDA and has been authorized for detection and/or diagnosis of SARS-CoV-2 by FDA under an Emergency Use Authorization (EUA). This EUA will remain in effect (meaning this test can be used) for the duration of the COVID-19 declaration under Section 564(b)(1) of the Act, 21 U.S.C. section 360bbb-3(b)(1), unless the authorization is terminated or revoked.  Performed at [facility], [address], [place], [place] [address]      Labs: BNP (last 3 results) No results for input(s): BNP in the last 8760 hours. Basic Metabolic Panel: Recent Labs  Lab 12/15/19 0920 12/16/19 0641  NA 141 140  K 3.9 3.8  CL 106 106  CO2 28 26  GLUCOSE 102* 101*  BUN 16 10  CREATININE 0.62 0.51  CALCIUM 8.6* 8.4*   Liver Function Tests: Recent Labs  Lab 12/15/19 0920  AST 15  ALT 11  ALKPHOS 52  BILITOT 0.4  PROT 6.4*  ALBUMIN 3.5   Recent Labs  Lab 12/15/19 0920  LIPASE 24   No results for input(s): AMMONIA in the last 168 hours. CBC: Recent Labs  Lab 12/15/19 0920 12/15/19 2142 12/16/19 0641 12/17/19 0436 12/18/19 0530  WBC 8.6  --  6.4 6.8 7.8  NEUTROABS 5.3  --   --   --   --   HGB 9.9* 8.3* 8.4* 7.5* 8.2*  HCT 33.7* 27.6* 28.1* 25.4* 28.2*  MCV 99.7  --  98.6 98.4 97.9  PLT 271  --  235 217 239   Cardiac Enzymes: No results for input(s): CKTOTAL, CKMB, CKMBINDEX, TROPONINI in the last 168 hours.  BNP: Invalid input(s): POCBNP CBG: No results for input(s):  GLUCAP in the last 168 hours. D-Dimer No results for input(s): DDIMER in the last 72 hours. Hgb A1c No results for input(s): HGBA1C in the last 72 hours. Lipid Profile No results for input(s): CHOL, HDL, LDLCALC, TRIG, CHOLHDL, LDLDIRECT in the last 72 hours. Thyroid function studies No results for input(s): TSH, T4TOTAL, T3FREE, THYROIDAB in the last 72 hours.  Invalid input(s): FREET3 Anemia work up No results for input(s): VITAMINB12, FOLATE, FERRITIN, TIBC, IRON, RETICCTPCT in the last 72 hours. Urinalysis    Component Value Date/Time   COLORURINE STRAW (A) 12/17/2019 1233   APPEARANCEUR CLEAR 12/17/2019 1233   LABSPEC 1.006 12/17/2019 1233   PHURINE 6.0 12/17/2019 1233   GLUCOSEU NEGATIVE 12/17/2019 1233   HGBUR NEGATIVE 12/17/2019 1233   BILIRUBINUR NEGATIVE 12/17/2019 1233   KETONESUR NEGATIVE 12/17/2019 1233   PROTEINUR NEGATIVE 12/17/2019 1233   UROBILINOGEN 1.0 11/27/2011 0940   NITRITE NEGATIVE 12/17/2019 1233   LEUKOCYTESUR NEGATIVE 12/17/2019 1233   Sepsis Labs Invalid input(s): PROCALCITONIN,  WBC,  LACTICIDVEN Microbiology Recent Results (from the past 240 hour(s))  Resp Panel by RT-PCR (Flu A&B, Covid) Nasopharyngeal Swab     Status: None   Collection Time: 12/15/19  3:58 PM   Specimen: Nasopharyngeal Swab; Nasopharyngeal(NP) swabs in vial transport medium  Result Value Ref Range Status   SARS Coronavirus 2 by RT PCR NEGATIVE NEGATIVE Final    Comment: (NOTE) SARS-CoV-2 target nucleic acids are NOT DETECTED.  The SARS-CoV-2 RNA is generally detectable in upper respiratory specimens during the acute phase of infection. The lowest concentration of SARS-CoV-2 viral copies this assay can detect is 138 copies/mL. A negative result does not preclude SARS-Cov-2 infection and should not be used as the sole basis for treatment or other patient management decisions. A negative result may occur with  improper specimen collection/handling, submission of specimen  other than nasopharyngeal swab, presence of viral mutation(s) within the areas targeted by this assay, and inadequate number of viral copies(<138 copies/mL). A negative result must be combined with clinical observations, patient history, and epidemiological information. The expected result is Negative.  Fact Sheet for Patients:  EntrepreneurPulse.com.au  Fact Sheet for Healthcare Providers:  IncredibleEmployment.be  This test is no t yet approved or cleared by the [place] FDA and  has been authorized for detection and/or diagnosis of SARS-CoV-2 by FDA under an Emergency Use Authorization (EUA). This EUA will remain  in effect (meaning this test can be used) for the duration of the COVID-19 declaration under Section 564(b)(1) of the Act, 21 U.S.C.section 360bbb-3(b)(1), unless the authorization is terminated  or revoked sooner.       Influenza A by PCR NEGATIVE NEGATIVE Final   Influenza B by PCR NEGATIVE NEGATIVE Final    Comment: (NOTE) The Xpert Xpress SARS-CoV-2/FLU/RSV plus assay is intended as an aid in the diagnosis of influenza from Nasopharyngeal swab specimens and should not be used as a sole basis for treatment. Nasal washings and aspirates are unacceptable for Xpert Xpress SARS-CoV-2/FLU/RSV testing.  Fact Sheet for Patients: EntrepreneurPulse.com.au  Fact Sheet for Healthcare Providers: IncredibleEmployment.be  This test is not yet approved or cleared by the [place] FDA and has been authorized for detection and/or diagnosis of SARS-CoV-2 by FDA under an Emergency Use Authorization (EUA). This EUA will remain in effect (meaning this test can be used) for the duration of the COVID-19 declaration under Section 564(b)(1) of the Act, 21 U.S.C. section 360bbb-3(b)(1), unless the authorization  is terminated or revoked.  Performed at [facility], [address] [place], [place]  [address]    Time coordinating discharge: 36 minutes   SIGNED:  Irwin Brakeman, MD  [facility] 12/18/2019, 8:00 AM How to contact the [facility] Attending or Consulting provider [place] or covering provider during after hours [place], for this patient?  1. Check the care team in [facility] and look for a) attending/consulting [facility] provider listed and b) the [facility] team listed 2. Log into [email or web address] and use [place]'s universal password to access. If you do not have the password, please contact the hospital operator. 3. Locate the [facility] provider you are looking for under [facility] and page to a number that you can be directly reached. 4. If you still have difficulty reaching the provider, please page the [facility] (Director on Call) for the Hospitalists listed on amion for assistance.

## 2019-12-19 DIAGNOSIS — S15009D Unspecified injury of unspecified carotid artery, subsequent encounter: Secondary | ICD-10-CM | POA: Diagnosis not present

## 2019-12-19 DIAGNOSIS — S066X0D Traumatic subarachnoid hemorrhage without loss of consciousness, subsequent encounter: Secondary | ICD-10-CM | POA: Diagnosis not present

## 2019-12-19 DIAGNOSIS — W19XXXD Unspecified fall, subsequent encounter: Secondary | ICD-10-CM | POA: Diagnosis not present

## 2019-12-19 DIAGNOSIS — S2241XD Multiple fractures of ribs, right side, subsequent encounter for fracture with routine healing: Secondary | ICD-10-CM | POA: Diagnosis not present

## 2019-12-19 DIAGNOSIS — G309 Alzheimer's disease, unspecified: Secondary | ICD-10-CM | POA: Diagnosis not present

## 2019-12-19 DIAGNOSIS — S02119D Unspecified fracture of occiput, subsequent encounter for fracture with routine healing: Secondary | ICD-10-CM | POA: Diagnosis not present

## 2019-12-20 DIAGNOSIS — S02119D Unspecified fracture of occiput, subsequent encounter for fracture with routine healing: Secondary | ICD-10-CM | POA: Diagnosis not present

## 2019-12-20 DIAGNOSIS — S066X0D Traumatic subarachnoid hemorrhage without loss of consciousness, subsequent encounter: Secondary | ICD-10-CM | POA: Diagnosis not present

## 2019-12-20 DIAGNOSIS — G309 Alzheimer's disease, unspecified: Secondary | ICD-10-CM | POA: Diagnosis not present

## 2019-12-20 DIAGNOSIS — W19XXXD Unspecified fall, subsequent encounter: Secondary | ICD-10-CM | POA: Diagnosis not present

## 2019-12-20 DIAGNOSIS — S2241XD Multiple fractures of ribs, right side, subsequent encounter for fracture with routine healing: Secondary | ICD-10-CM | POA: Diagnosis not present

## 2019-12-20 DIAGNOSIS — S15009D Unspecified injury of unspecified carotid artery, subsequent encounter: Secondary | ICD-10-CM | POA: Diagnosis not present

## 2019-12-21 DIAGNOSIS — W19XXXD Unspecified fall, subsequent encounter: Secondary | ICD-10-CM | POA: Diagnosis not present

## 2019-12-21 DIAGNOSIS — S066X0D Traumatic subarachnoid hemorrhage without loss of consciousness, subsequent encounter: Secondary | ICD-10-CM | POA: Diagnosis not present

## 2019-12-21 DIAGNOSIS — G309 Alzheimer's disease, unspecified: Secondary | ICD-10-CM | POA: Diagnosis not present

## 2019-12-21 DIAGNOSIS — S15009D Unspecified injury of unspecified carotid artery, subsequent encounter: Secondary | ICD-10-CM | POA: Diagnosis not present

## 2019-12-21 DIAGNOSIS — S2241XD Multiple fractures of ribs, right side, subsequent encounter for fracture with routine healing: Secondary | ICD-10-CM | POA: Diagnosis not present

## 2019-12-21 DIAGNOSIS — S02119D Unspecified fracture of occiput, subsequent encounter for fracture with routine healing: Secondary | ICD-10-CM | POA: Diagnosis not present

## 2019-12-22 ENCOUNTER — Encounter (HOSPITAL_COMMUNITY): Payer: Self-pay | Admitting: Gastroenterology

## 2019-12-22 DIAGNOSIS — S02119D Unspecified fracture of occiput, subsequent encounter for fracture with routine healing: Secondary | ICD-10-CM | POA: Diagnosis not present

## 2019-12-22 DIAGNOSIS — S066X0D Traumatic subarachnoid hemorrhage without loss of consciousness, subsequent encounter: Secondary | ICD-10-CM | POA: Diagnosis not present

## 2019-12-22 DIAGNOSIS — S15009D Unspecified injury of unspecified carotid artery, subsequent encounter: Secondary | ICD-10-CM | POA: Diagnosis not present

## 2019-12-22 DIAGNOSIS — W19XXXD Unspecified fall, subsequent encounter: Secondary | ICD-10-CM | POA: Diagnosis not present

## 2019-12-22 DIAGNOSIS — S2241XD Multiple fractures of ribs, right side, subsequent encounter for fracture with routine healing: Secondary | ICD-10-CM | POA: Diagnosis not present

## 2019-12-22 DIAGNOSIS — G309 Alzheimer's disease, unspecified: Secondary | ICD-10-CM | POA: Diagnosis not present

## 2019-12-26 DIAGNOSIS — S2241XD Multiple fractures of ribs, right side, subsequent encounter for fracture with routine healing: Secondary | ICD-10-CM | POA: Diagnosis not present

## 2019-12-26 DIAGNOSIS — G309 Alzheimer's disease, unspecified: Secondary | ICD-10-CM | POA: Diagnosis not present

## 2019-12-26 DIAGNOSIS — S02119D Unspecified fracture of occiput, subsequent encounter for fracture with routine healing: Secondary | ICD-10-CM | POA: Diagnosis not present

## 2019-12-26 DIAGNOSIS — S066X0D Traumatic subarachnoid hemorrhage without loss of consciousness, subsequent encounter: Secondary | ICD-10-CM | POA: Diagnosis not present

## 2019-12-26 DIAGNOSIS — W19XXXD Unspecified fall, subsequent encounter: Secondary | ICD-10-CM | POA: Diagnosis not present

## 2019-12-26 DIAGNOSIS — S15009D Unspecified injury of unspecified carotid artery, subsequent encounter: Secondary | ICD-10-CM | POA: Diagnosis not present

## 2019-12-27 DIAGNOSIS — S02119D Unspecified fracture of occiput, subsequent encounter for fracture with routine healing: Secondary | ICD-10-CM | POA: Diagnosis not present

## 2019-12-27 DIAGNOSIS — S15009D Unspecified injury of unspecified carotid artery, subsequent encounter: Secondary | ICD-10-CM | POA: Diagnosis not present

## 2019-12-27 DIAGNOSIS — S2241XD Multiple fractures of ribs, right side, subsequent encounter for fracture with routine healing: Secondary | ICD-10-CM | POA: Diagnosis not present

## 2019-12-27 DIAGNOSIS — S066X0D Traumatic subarachnoid hemorrhage without loss of consciousness, subsequent encounter: Secondary | ICD-10-CM | POA: Diagnosis not present

## 2019-12-27 DIAGNOSIS — W19XXXD Unspecified fall, subsequent encounter: Secondary | ICD-10-CM | POA: Diagnosis not present

## 2019-12-27 DIAGNOSIS — G309 Alzheimer's disease, unspecified: Secondary | ICD-10-CM | POA: Diagnosis not present

## 2019-12-28 ENCOUNTER — Encounter: Payer: Self-pay | Admitting: *Deleted

## 2019-12-28 DIAGNOSIS — I4891 Unspecified atrial fibrillation: Secondary | ICD-10-CM | POA: Diagnosis not present

## 2019-12-28 DIAGNOSIS — K259 Gastric ulcer, unspecified as acute or chronic, without hemorrhage or perforation: Secondary | ICD-10-CM | POA: Diagnosis not present

## 2019-12-28 DIAGNOSIS — Z09 Encounter for follow-up examination after completed treatment for conditions other than malignant neoplasm: Secondary | ICD-10-CM | POA: Diagnosis not present

## 2019-12-28 DIAGNOSIS — F322 Major depressive disorder, single episode, severe without psychotic features: Secondary | ICD-10-CM | POA: Diagnosis not present

## 2019-12-28 DIAGNOSIS — I10 Essential (primary) hypertension: Secondary | ICD-10-CM | POA: Diagnosis not present

## 2019-12-29 ENCOUNTER — Ambulatory Visit: Payer: Medicare Other | Admitting: Family Medicine

## 2020-01-03 DIAGNOSIS — S066X0D Traumatic subarachnoid hemorrhage without loss of consciousness, subsequent encounter: Secondary | ICD-10-CM | POA: Diagnosis not present

## 2020-01-03 DIAGNOSIS — G309 Alzheimer's disease, unspecified: Secondary | ICD-10-CM | POA: Diagnosis not present

## 2020-01-03 DIAGNOSIS — S2241XD Multiple fractures of ribs, right side, subsequent encounter for fracture with routine healing: Secondary | ICD-10-CM | POA: Diagnosis not present

## 2020-01-03 DIAGNOSIS — S15009D Unspecified injury of unspecified carotid artery, subsequent encounter: Secondary | ICD-10-CM | POA: Diagnosis not present

## 2020-01-03 DIAGNOSIS — W19XXXD Unspecified fall, subsequent encounter: Secondary | ICD-10-CM | POA: Diagnosis not present

## 2020-01-03 DIAGNOSIS — S02119D Unspecified fracture of occiput, subsequent encounter for fracture with routine healing: Secondary | ICD-10-CM | POA: Diagnosis not present

## 2020-01-04 DIAGNOSIS — S066X0D Traumatic subarachnoid hemorrhage without loss of consciousness, subsequent encounter: Secondary | ICD-10-CM | POA: Diagnosis not present

## 2020-01-04 DIAGNOSIS — S15009D Unspecified injury of unspecified carotid artery, subsequent encounter: Secondary | ICD-10-CM | POA: Diagnosis not present

## 2020-01-04 DIAGNOSIS — S2241XD Multiple fractures of ribs, right side, subsequent encounter for fracture with routine healing: Secondary | ICD-10-CM | POA: Diagnosis not present

## 2020-01-04 DIAGNOSIS — S02119D Unspecified fracture of occiput, subsequent encounter for fracture with routine healing: Secondary | ICD-10-CM | POA: Diagnosis not present

## 2020-01-04 DIAGNOSIS — W19XXXD Unspecified fall, subsequent encounter: Secondary | ICD-10-CM | POA: Diagnosis not present

## 2020-01-04 DIAGNOSIS — G309 Alzheimer's disease, unspecified: Secondary | ICD-10-CM | POA: Diagnosis not present

## 2020-01-05 DIAGNOSIS — I10 Essential (primary) hypertension: Secondary | ICD-10-CM | POA: Diagnosis not present

## 2020-01-05 DIAGNOSIS — M159 Polyosteoarthritis, unspecified: Secondary | ICD-10-CM | POA: Diagnosis not present

## 2020-01-05 DIAGNOSIS — M810 Age-related osteoporosis without current pathological fracture: Secondary | ICD-10-CM | POA: Diagnosis not present

## 2020-01-05 DIAGNOSIS — I4891 Unspecified atrial fibrillation: Secondary | ICD-10-CM | POA: Diagnosis not present

## 2020-01-06 DIAGNOSIS — S066X0D Traumatic subarachnoid hemorrhage without loss of consciousness, subsequent encounter: Secondary | ICD-10-CM | POA: Diagnosis not present

## 2020-01-06 DIAGNOSIS — S2241XD Multiple fractures of ribs, right side, subsequent encounter for fracture with routine healing: Secondary | ICD-10-CM | POA: Diagnosis not present

## 2020-01-06 DIAGNOSIS — D649 Anemia, unspecified: Secondary | ICD-10-CM | POA: Diagnosis not present

## 2020-01-06 DIAGNOSIS — M810 Age-related osteoporosis without current pathological fracture: Secondary | ICD-10-CM | POA: Diagnosis not present

## 2020-01-06 DIAGNOSIS — Z952 Presence of prosthetic heart valve: Secondary | ICD-10-CM | POA: Diagnosis not present

## 2020-01-06 DIAGNOSIS — I10 Essential (primary) hypertension: Secondary | ICD-10-CM | POA: Diagnosis not present

## 2020-01-06 DIAGNOSIS — S02119D Unspecified fracture of occiput, subsequent encounter for fracture with routine healing: Secondary | ICD-10-CM | POA: Diagnosis not present

## 2020-01-06 DIAGNOSIS — E871 Hypo-osmolality and hyponatremia: Secondary | ICD-10-CM | POA: Diagnosis not present

## 2020-01-06 DIAGNOSIS — S15009D Unspecified injury of unspecified carotid artery, subsequent encounter: Secondary | ICD-10-CM | POA: Diagnosis not present

## 2020-01-06 DIAGNOSIS — I719 Aortic aneurysm of unspecified site, without rupture: Secondary | ICD-10-CM | POA: Diagnosis not present

## 2020-01-06 DIAGNOSIS — E7800 Pure hypercholesterolemia, unspecified: Secondary | ICD-10-CM | POA: Diagnosis not present

## 2020-01-06 DIAGNOSIS — F322 Major depressive disorder, single episode, severe without psychotic features: Secondary | ICD-10-CM | POA: Diagnosis not present

## 2020-01-06 DIAGNOSIS — F0280 Dementia in other diseases classified elsewhere without behavioral disturbance: Secondary | ICD-10-CM | POA: Diagnosis not present

## 2020-01-06 DIAGNOSIS — I4891 Unspecified atrial fibrillation: Secondary | ICD-10-CM | POA: Diagnosis not present

## 2020-01-06 DIAGNOSIS — Z9181 History of falling: Secondary | ICD-10-CM | POA: Diagnosis not present

## 2020-01-06 DIAGNOSIS — G309 Alzheimer's disease, unspecified: Secondary | ICD-10-CM | POA: Diagnosis not present

## 2020-01-06 DIAGNOSIS — W19XXXD Unspecified fall, subsequent encounter: Secondary | ICD-10-CM | POA: Diagnosis not present

## 2020-01-06 DIAGNOSIS — R32 Unspecified urinary incontinence: Secondary | ICD-10-CM | POA: Diagnosis not present

## 2020-01-10 DIAGNOSIS — S15009D Unspecified injury of unspecified carotid artery, subsequent encounter: Secondary | ICD-10-CM | POA: Diagnosis not present

## 2020-01-10 DIAGNOSIS — G309 Alzheimer's disease, unspecified: Secondary | ICD-10-CM | POA: Diagnosis not present

## 2020-01-10 DIAGNOSIS — S02119D Unspecified fracture of occiput, subsequent encounter for fracture with routine healing: Secondary | ICD-10-CM | POA: Diagnosis not present

## 2020-01-10 DIAGNOSIS — W19XXXD Unspecified fall, subsequent encounter: Secondary | ICD-10-CM | POA: Diagnosis not present

## 2020-01-10 DIAGNOSIS — S066X0D Traumatic subarachnoid hemorrhage without loss of consciousness, subsequent encounter: Secondary | ICD-10-CM | POA: Diagnosis not present

## 2020-01-10 DIAGNOSIS — S2241XD Multiple fractures of ribs, right side, subsequent encounter for fracture with routine healing: Secondary | ICD-10-CM | POA: Diagnosis not present

## 2020-01-11 DIAGNOSIS — S2241XD Multiple fractures of ribs, right side, subsequent encounter for fracture with routine healing: Secondary | ICD-10-CM | POA: Diagnosis not present

## 2020-01-11 DIAGNOSIS — S02119D Unspecified fracture of occiput, subsequent encounter for fracture with routine healing: Secondary | ICD-10-CM | POA: Diagnosis not present

## 2020-01-11 DIAGNOSIS — G309 Alzheimer's disease, unspecified: Secondary | ICD-10-CM | POA: Diagnosis not present

## 2020-01-11 DIAGNOSIS — W19XXXD Unspecified fall, subsequent encounter: Secondary | ICD-10-CM | POA: Diagnosis not present

## 2020-01-11 DIAGNOSIS — S15009D Unspecified injury of unspecified carotid artery, subsequent encounter: Secondary | ICD-10-CM | POA: Diagnosis not present

## 2020-01-11 DIAGNOSIS — S066X0D Traumatic subarachnoid hemorrhage without loss of consciousness, subsequent encounter: Secondary | ICD-10-CM | POA: Diagnosis not present

## 2020-01-16 NOTE — Progress Notes (Signed)
Cardiology Office Note  Date: 01/16/2020   ID: Lauren Morrow, DOB 01-30-1924, MRN [id number]  PCP:  Lauren Chroman, MD  Cardiologist:  Lauren Dolly, MD Electrophysiologist:  None   Chief Complaint: To discuss changing medications due to recent GI bleed.  History of Present Illness: Lauren Morrow is a 87 y.o. female with a history of atrial fibrillation, aortic stenosis, HTN, hypotension, AAA.   Last visit with Dr. Harl Morrow 03/23/2019 via telemedicine visit for follow-up status post aortic valve replacement, hypertension, atrial fibrillation, previous GI bleed at [facility] where aspirin had been previously stopped.  She was continuing her current antihypertensive medications.  Recent hospital admission on 12/15/2019.  She had been having generalized abdominal pain and bright red rectal bleeding the prior day.  She reported another episode at 4 AM the morning before presentation with rib blood mixed with black stools.  She had been on full dose aspirin for anticoagulation since October 2021.  Her hemoglobin was 9.9.  Prior hemoglobin was 14.  GI was consulted and she was admitted for further GI work-up for rectal bleed.  She had an EGD on December 10 with Dr. Jenetta Morrow with findings of normal esophagus, healing erosions in stomach, normal duodenum.  She was continuing Protonix twice daily.  Aspirin was held.  Sister spoke with PCP who recommended aspirin 81 mg.  She was asked to hold aspirin until follow-up with PCP and discuss risk versus benefits of taking aspirin in setting of GI bleed.  She had a subsequent EGD which showed erosive gastropathy with stigmata of recent bleeding on 12/16/2019.  She is here today for follow-up since her aspirin had been stopped secondary to GI bleeding.  Her sister who is with her today stated she had a recent fall with suspected occlusion of an artery in her neck.  She was transferred to [facility] secondary to fall with noted  occipital fracture.  Small inferior IPH in the left frontal lobe pole parenchymal hemorrhage.  Small volume SAH, moderate bilateral subdural collections/hygromas.  Right ribs 4-6 age-indeterminate rib fracture.  She was started on aspirin 325 for BCVI.  Repeat CTA of the neck showed persistent luminal irregularity in the bilateral internal carotid arteries without high-grade stenosis or discrete dissection and favoring fibromuscular dysplasia as well as high-grade stenosis of the V4 segment of the left vertebral artery likely plaque versus fibromuscular dysplasia.  Plan was to continue aspirin and repeat CT of the neck in 1 month.  She was discharged on aspirin 325 mg until follow-up with trauma with repeat CTA of her neck.  Patient states she is still having black tarry looking stools.  Had a recent EGD secondary to bleeding demonstrating a few localized 2 to 3 mm erosions with stigmata of recent bleeding (scant hematin) were found in the gastric antrum.  Impression was normal esophagus, erosive gastropathy with stigmata of recent bleeding.  Normal examined duodenum.  No specimens collected.   Past Medical History:  Diagnosis Date   Anemia    hx of   Anxiety    Aortic stenosis    Dr. Rod Morrow, saw last 09/11/11   Arthritis    Atrial fibrillation ([place])    Post op, sees  Dr. Fletcher Morrow   Cataracts, bilateral    Dizziness    GERD (gastroesophageal reflux disease)    Hypertension    sees Dr. Woody Seller, primary   Low back pain    Osteomyelitis [facility])    history of osteomyelistis  in the leg   Peripheral vascular disease (HCC)    Seasonal allergies    Shortness of breath    "gets short of breath at time since the heart surgery 2012"   Urinary tract infection    hx of   Varicose veins     Past Surgical History:  Procedure Laterality Date   ABDOMINAL AORTIC ANEURYSM REPAIR  11/21/2011   Procedure: ANEURYSM ABDOMINAL AORTIC REPAIR;  Surgeon: Mal Misty, MD;  Location: [facility] OR;  Service:  Vascular;  Laterality: N/A;  using 16 x 8 mm x 40 cm hemashield graft   AORTIC VALVE REPLACEMENT  11/22/2010   79mm pericardial valve serial#[id number], model [id number]   AORTIC VALVE REPLACEMENT     In 2012 with a bioprosthetic valve   bladder tact     1960's   CARDIAC CATHETERIZATION     CATARACT EXTRACTION W/PHACO Right 12/09/2012   Procedure: CATARACT EXTRACTION PHACO AND INTRAOCULAR LENS PLACEMENT ([place]);  Surgeon: Lauren Branch, MD;  Location: [facility] ORS;  Service: Ophthalmology;  Laterality: Right;  CDE 19.32   CATARACT EXTRACTION W/PHACO Left 12/27/2012   Procedure: LEFT EYE CATARACT EXTRACTION PHACO AND INTRAOCULAR LENS PLACEMENT ;  Surgeon: Lauren Branch, MD;  Location: [facility] ORS;  Service: Ophthalmology;  Laterality: Left;  CDE 10.78   ESOPHAGOGASTRODUODENOSCOPY (EGD) WITH PROPOFOL N/A 12/16/2019   Procedure: ESOPHAGOGASTRODUODENOSCOPY (EGD) WITH PROPOFOL;  Surgeon: Lauren Quale, MD;  Location: [facility];  Service: Gastroenterology;  Laterality: N/A;   LAPAROTOMY  11/21/2011   Procedure: EXPLORATORY LAPAROTOMY;  Surgeon: Mal Misty, MD;  Location: [place];  Service: Vascular;  Laterality: N/A;  exploratory laparotomy, evacuation of hematoma, suture ligation of lumbar arterial Morrow    PATCH ANGIOPLASTY  11/21/2011   Procedure: PATCH ANGIOPLASTY;  Surgeon: Mal Misty, MD;  Location: [place];  Service: Vascular;  Laterality: Right;  Iliac vein   [place]   dr. Maryjean Morrow    Current Outpatient Medications  Medication Sig Dispense Refill   fish oil-omega-3 fatty acids 1000 MG capsule Take 1 g by mouth daily.     Garlic [id number] MG CAPS Take 1,000 mg by mouth daily.     levothyroxine (SYNTHROID, LEVOTHROID) 50 MCG tablet Take 50 mcg by mouth daily before breakfast.     metoprolol tartrate (LOPRESSOR) 25 MG tablet TAKE ONE TABLET BY MOUTH TWICE DAILY 180 tablet 3   Multiple Vitamin (MULTIVITAMIN) tablet Take 1 tablet by mouth daily.       Multiple  Vitamins-Minerals (PRESERVISION AREDS 2) CAPS Take 1 capsule by mouth daily.     pantoprazole (PROTONIX) 40 MG tablet Take 1 tablet (40 mg total) by mouth 2 (two) times daily. 60 tablet 1   polyethylene glycol powder (GLYCOLAX/MIRALAX) 17 GM/SCOOP powder Take 17 g by mouth daily.     raloxifene (EVISTA) 60 MG tablet Take 60 mg by mouth daily.      sertraline (ZOLOFT) 50 MG tablet Take 50 mg by mouth daily.      valsartan (DIOVAN) 80 MG tablet Take 80 mg by mouth daily.     No current facility-administered medications for this visit.   Allergies:  Avelox [moxifloxacin hcl in nacl], Penicillins, and Sulfa antibiotics   Social History: The patient  reports that she has never smoked. She has never used smokeless tobacco. She reports that she does not drink alcohol and does not use drugs.   Family History: The patient's family history includes Cancer in her sister; Hyperlipidemia in  her brother; Other in her brother and mother.   ROS:  Please see the history of present illness. Otherwise, complete review of systems is positive for none.  All other systems are reviewed and negative.   Physical Exam: VS:  There were no vitals taken for this visit., BMI There is no height or weight on file to calculate BMI.  Wt Readings from Last 3 Encounters:  12/16/19 140 lb 10.5 oz (63.8 kg)  03/23/19 150 lb (68 kg)  12/17/17 149 lb 6.4 oz (67.8 kg)    General: Patient appears comfortable at rest. HEENT: Conjunctiva and lids normal, oropharynx clear with moist mucosa. Neck: Supple, no elevated JVP or carotid bruits, no thyromegaly. Lungs: Clear to auscultation, nonlabored breathing at rest. Cardiac: Regular rate and rhythm, no S3 or significant systolic murmur, no pericardial rub. Abdomen: Soft, nontender, no hepatomegaly, bowel sounds present, no guarding or rebound. Extremities: No pitting edema, distal pulses 2+. Skin: Warm and dry. Musculoskeletal: No kyphosis. Neuropsychiatric: Alert and  oriented x3, affect grossly appropriate.  ECG:  EKG 10/07/2019 [facility] EKG: Sinus rhythm rate of 70 with marked ST abnormality possible inferior subendocardial injury  Recent Labwork: 12/15/2019: ALT 11; AST 15 12/16/2019: BUN 10; Creatinine, Ser 0.51; Potassium 3.8; Sodium 140 12/18/2019: Hemoglobin 8.2; Platelets 239  No results found for: CHOL, TRIG, HDL, CHOLHDL, VLDL, LDLCALC, LDLDIRECT  Other Studies Reviewed Today:   Echocardiogram 10/07/2019 [facility]. SUMMARY  The left ventricular size is normal.  There is normal left ventricular wall thickness.  LV ejection fraction = 60-65%.  Left ventricular systolic function is normal.  Left ventricular filling pattern is prolonged relaxation.  The right ventricle is normal in size and function.  There is aortic valve sclerosis with diffuse calcification and  restricted leaflet opening. Aortic valve gradients are not consistent  with stenosis, but the valve visually appears stenotic.  There is mild mitral annular calcification.  There is mild tricuspid regurgitation.  No pulmonary hypertension.  The IVC is normal in size with an inspiratory collapse of greater then  50%, suggesting normal right atrial pressure.  There is no pericardial effusion.   There is no comparison study available.       CT ANGIOGRAPHY NECK 11/10/2019: [facility].  Anatomical Region Laterality Modality  Neck -- Computed Tomography    Impression 1. Chronic atherosclerotic plaque and/or fibromuscular dysplasia involving the bilateral carotid bifurcations and mid cervical internal carotid arteries without high-grade stenosis or focal dissection.   2. Moderate to high-grade atherosclerotic narrowing of the origins of the vertebral arteries bilaterally with multifocal fibromuscular dysplasia and/or noncalcified atherosclerotic plaque induced irregularity throughout the left greater than right  vertebral artery P2 and V3 segments. Chronic high-grade stenosis of the distal V4 segment of the left vertebral artery and proximal basilar artery.   Assessment and Plan:  1. Atrial fibrillation, unspecified type ([place])   2. Essential hypertension   3. Gastrointestinal hemorrhage associated with gastritis, unspecified gastritis type    1. Atrial fibrillation, unspecified type [facility]) Denies any palpitations or arrhythmias.  Last EKG noted she was in normal sinus rhythm with heart rate of 70.  Advised to continue to hold aspirin for now due to recent bleeding and falls.  Continue metoprolol 25 mg p.o. twice daily.  2. Essential hypertension Blood pressure 132/86 today on arrival.  Continue valsartan 80 mg daily p.o.  3. Gastrointestinal hemorrhage associated with gastritis, unspecified gastritis type Recent GI bleed secondary to regular strength  aspirin 325 mg started at [facility] unit versus the [facility].  Continues to have black tarry stools per her statement.  We will continue to hold aspirin for now and follow-up in 1 month.  Denies any bright red blood per rectum or bloody stools.  Medication Adjustments/Labs and Tests Ordered: Current medicines are reviewed at length with the patient today.  Concerns regarding medicines are outlined above.   Disposition: Follow-up with Dr. Harl Morrow or APP 1 month Signed, Levell July, NP 01/16/2020 12:54 PM    [place] at [address], [place], [place] [address] Phone: [phone]; Fax: [phone]

## 2020-01-17 ENCOUNTER — Encounter: Payer: Self-pay | Admitting: Family Medicine

## 2020-01-17 ENCOUNTER — Other Ambulatory Visit: Payer: Self-pay

## 2020-01-17 ENCOUNTER — Ambulatory Visit (INDEPENDENT_AMBULATORY_CARE_PROVIDER_SITE_OTHER): Payer: Medicare Other | Admitting: Family Medicine

## 2020-01-17 VITALS — BP 132/86 | HR 74 | Ht 68.0 in | Wt 138.8 lb

## 2020-01-17 DIAGNOSIS — K2971 Gastritis, unspecified, with bleeding: Secondary | ICD-10-CM | POA: Diagnosis not present

## 2020-01-17 DIAGNOSIS — I10 Essential (primary) hypertension: Secondary | ICD-10-CM | POA: Diagnosis not present

## 2020-01-17 DIAGNOSIS — I4891 Unspecified atrial fibrillation: Secondary | ICD-10-CM

## 2020-01-17 NOTE — Patient Instructions (Signed)
Medication Instructions:   Remain off of the Aspirin for now.  Will re-evaluate at next visit.   Continue all other medications.    Labwork: none  Testing/Procedures: none  Follow-Up: 1 month   Any Other Special Instructions Will Be Listed Below (If Applicable).  If you need a refill on your cardiac medications before your next appointment, please call your pharmacy.

## 2020-01-19 DIAGNOSIS — G309 Alzheimer's disease, unspecified: Secondary | ICD-10-CM | POA: Diagnosis not present

## 2020-01-19 DIAGNOSIS — S02119D Unspecified fracture of occiput, subsequent encounter for fracture with routine healing: Secondary | ICD-10-CM | POA: Diagnosis not present

## 2020-01-19 DIAGNOSIS — S15009D Unspecified injury of unspecified carotid artery, subsequent encounter: Secondary | ICD-10-CM | POA: Diagnosis not present

## 2020-01-19 DIAGNOSIS — W19XXXD Unspecified fall, subsequent encounter: Secondary | ICD-10-CM | POA: Diagnosis not present

## 2020-01-19 DIAGNOSIS — S2241XD Multiple fractures of ribs, right side, subsequent encounter for fracture with routine healing: Secondary | ICD-10-CM | POA: Diagnosis not present

## 2020-01-19 DIAGNOSIS — S066X0D Traumatic subarachnoid hemorrhage without loss of consciousness, subsequent encounter: Secondary | ICD-10-CM | POA: Diagnosis not present

## 2020-01-27 DIAGNOSIS — S15009D Unspecified injury of unspecified carotid artery, subsequent encounter: Secondary | ICD-10-CM | POA: Diagnosis not present

## 2020-01-27 DIAGNOSIS — S2241XD Multiple fractures of ribs, right side, subsequent encounter for fracture with routine healing: Secondary | ICD-10-CM | POA: Diagnosis not present

## 2020-01-27 DIAGNOSIS — W19XXXD Unspecified fall, subsequent encounter: Secondary | ICD-10-CM | POA: Diagnosis not present

## 2020-01-27 DIAGNOSIS — S02119D Unspecified fracture of occiput, subsequent encounter for fracture with routine healing: Secondary | ICD-10-CM | POA: Diagnosis not present

## 2020-01-27 DIAGNOSIS — G309 Alzheimer's disease, unspecified: Secondary | ICD-10-CM | POA: Diagnosis not present

## 2020-01-27 DIAGNOSIS — S066X0D Traumatic subarachnoid hemorrhage without loss of consciousness, subsequent encounter: Secondary | ICD-10-CM | POA: Diagnosis not present

## 2020-01-30 DIAGNOSIS — S2241XD Multiple fractures of ribs, right side, subsequent encounter for fracture with routine healing: Secondary | ICD-10-CM | POA: Diagnosis not present

## 2020-01-30 DIAGNOSIS — W19XXXD Unspecified fall, subsequent encounter: Secondary | ICD-10-CM | POA: Diagnosis not present

## 2020-01-30 DIAGNOSIS — G309 Alzheimer's disease, unspecified: Secondary | ICD-10-CM | POA: Diagnosis not present

## 2020-01-30 DIAGNOSIS — S066X0D Traumatic subarachnoid hemorrhage without loss of consciousness, subsequent encounter: Secondary | ICD-10-CM | POA: Diagnosis not present

## 2020-01-30 DIAGNOSIS — S02119D Unspecified fracture of occiput, subsequent encounter for fracture with routine healing: Secondary | ICD-10-CM | POA: Diagnosis not present

## 2020-01-30 DIAGNOSIS — S15009D Unspecified injury of unspecified carotid artery, subsequent encounter: Secondary | ICD-10-CM | POA: Diagnosis not present

## 2020-01-31 DIAGNOSIS — Z299 Encounter for prophylactic measures, unspecified: Secondary | ICD-10-CM | POA: Diagnosis not present

## 2020-01-31 DIAGNOSIS — I4891 Unspecified atrial fibrillation: Secondary | ICD-10-CM | POA: Diagnosis not present

## 2020-01-31 DIAGNOSIS — I10 Essential (primary) hypertension: Secondary | ICD-10-CM | POA: Diagnosis not present

## 2020-01-31 DIAGNOSIS — N390 Urinary tract infection, site not specified: Secondary | ICD-10-CM | POA: Diagnosis not present

## 2020-01-31 DIAGNOSIS — K219 Gastro-esophageal reflux disease without esophagitis: Secondary | ICD-10-CM | POA: Diagnosis not present

## 2020-02-06 DIAGNOSIS — I10 Essential (primary) hypertension: Secondary | ICD-10-CM | POA: Diagnosis not present

## 2020-02-13 NOTE — Progress Notes (Signed)
Cardiology Office Note  Date: 02/14/2020   ID: Lauren Morrow, DOB [birth year], MRN [id number]  PCP:  Glenda Chroman, MD  Cardiologist:  Carlyle Dolly, MD Electrophysiologist:  None   Chief Complaint: To discuss changing medications due to recent GI bleed.  History of Present Illness: Lauren Morrow is a 87 y.o. female with a history of atrial fibrillation, aortic stenosis, HTN, hypotension, AAA.   Last visit with Dr. Harl Bowie 03/23/2019 via telemedicine visit for follow-up status post aortic valve replacement, hypertension, atrial fibrillation, previous GI bleed at [facility] where aspirin had been previously stopped.  She was continuing her current antihypertensive medications.  Had a hospital admission on 12/15/2019 for rectal bleeding   She had been on full dose aspirin for anticoagulation since October 2021.  Her hemoglobin was 9.9.  Prior hemoglobin was 14.  GI was consulted and she was admitted for further GI work-up for rectal bleed.  She had an EGD on December 16, 2019 with Dr. Jenetta Downer with findings of normal esophagus, healing erosions in stomach, normal duodenum.  She was continuing Protonix twice daily.  Aspirin was held. She had a subsequent EGD which showed erosive gastropathy with stigmata of recent bleeding on 12/16/2019.  She had a subsequent fall fall with suspected occlusion of an artery in her neck.  She was transferred to [facility] secondary to fall with noted occipital fracture.  Small inferior IPH in the left frontal lobe pole parenchymal hemorrhage.  Small volume SAH, moderate bilateral subdural collections/hygromas.   She was started on aspirin 325 for BCVI.  Repeat CTA of the neck showed persistent luminal irregularity in the bilateral internal carotid arteries without high-grade stenosis or discrete dissection and favoring fibromuscular dysplasia as well as high-grade stenosis of the V4 segment of the left vertebral artery likely plaque  versus fibromuscular dysplasia.  Plan was to continue aspirin and repeat CT of the neck in 1 month.  She was discharged on aspirin 325 mg until follow-up with trauma with repeat CTA of her neck.  Patient states she is still having black tarry looking stools.  Had a recent EGD secondary to bleeding demonstrating a few localized 2 to 3 mm erosions with stigmata of recent bleeding (scant hematin) were found in the gastric antrum.  Impression was normal esophagus, erosive gastropathy with stigmata of recent bleeding.  Normal examined duodenum.  No specimens collected.    She is here today for follow-up.  She states the blood in her stool is stopped up but stools are still dark but not as dark as they were at last visit.  Her relative who is with her and is her caregiver states that primary care physician stated she should not start back on aspirin given bleeding risk and recent fall with evidence of hemorrhage on CT of the head.  Otherwise the patient denies any other issues.  Her relative/caregiver states she has been having some issues with some dizziness which is usually a sign she has a urinary tract infection and has been treated for this recently.  Past Medical History:  Diagnosis Date  . Anemia    hx of  . Anxiety   . Aortic stenosis    Dr. Rod Can, saw last 09/11/11  . Arthritis   . Atrial fibrillation ([place])    Post op, sees  Dr. Fletcher Anon  . Cataracts, bilateral   . Dizziness   . GERD (gastroesophageal reflux disease)   . Hypertension    sees Dr.  Vyas, primary  . Low back pain   . Osteomyelitis ([place])    history of osteomyelistis in the leg  . Peripheral vascular disease ([place])   . Seasonal allergies   . Shortness of breath    "gets short of breath at time since the heart surgery 2012"  . Urinary tract infection    hx of  . Varicose veins     Past Surgical History:  Procedure Laterality Date  . ABDOMINAL AORTIC ANEURYSM REPAIR  11/21/2011   Procedure: ANEURYSM ABDOMINAL AORTIC REPAIR;   Surgeon: Mal Misty, MD;  Location: [facility] OR;  Service: Vascular;  Laterality: N/A;  using 16 x 8 mm x 40 cm hemashield graft  . AORTIC VALVE REPLACEMENT  11/22/2010   52mm pericardial valve serial#[id number], model [id number]  . AORTIC VALVE REPLACEMENT     In 2012 with a bioprosthetic valve  . bladder tact     1960's  . CARDIAC CATHETERIZATION    . CATARACT EXTRACTION W/PHACO Right 12/09/2012   Procedure: CATARACT EXTRACTION PHACO AND INTRAOCULAR LENS PLACEMENT ([place]);  Surgeon: Tonny Branch, MD;  Location: [facility] ORS;  Service: Ophthalmology;  Laterality: Right;  CDE 19.32  . CATARACT EXTRACTION W/PHACO Left 12/27/2012   Procedure: LEFT EYE CATARACT EXTRACTION PHACO AND INTRAOCULAR LENS PLACEMENT ;  Surgeon: Tonny Branch, MD;  Location: [facility] ORS;  Service: Ophthalmology;  Laterality: Left;  CDE 10.78  . ESOPHAGOGASTRODUODENOSCOPY (EGD) WITH PROPOFOL N/A 12/16/2019   Procedure: ESOPHAGOGASTRODUODENOSCOPY (EGD) WITH PROPOFOL;  Surgeon: Harvel Quale, MD;  Location: [facility];  Service: Gastroenterology;  Laterality: N/A;  . LAPAROTOMY  11/21/2011   Procedure: EXPLORATORY LAPAROTOMY;  Surgeon: Mal Misty, MD;  Location: [facility] OR;  Service: Vascular;  Laterality: N/A;  exploratory laparotomy, evacuation of hematoma, suture ligation of lumbar arterial branch   . PATCH ANGIOPLASTY  11/21/2011   Procedure: PATCH ANGIOPLASTY;  Surgeon: Mal Misty, MD;  Location: [place];  Service: Vascular;  Laterality: Right;  Iliac vein  . SPINE SURGERY  1967  1976   dr. Maryjean Ka    Current Outpatient Medications  Medication Sig Dispense Refill  . fish oil-omega-3 fatty acids 1000 MG capsule Take 1 g by mouth daily.    . Garlic [id number] MG CAPS Take 1,000 mg by mouth daily.    Marland Kitchen levothyroxine (SYNTHROID, LEVOTHROID) 50 MCG tablet Take 50 mcg by mouth daily before breakfast.    . metoprolol tartrate (LOPRESSOR) 25 MG tablet TAKE ONE TABLET BY MOUTH TWICE DAILY 180 tablet 3  . Multiple Vitamin (MULTIVITAMIN)  tablet Take 1 tablet by mouth daily.      . Multiple Vitamins-Minerals (PRESERVISION AREDS 2) CAPS Take 1 capsule by mouth daily.    . pantoprazole (PROTONIX) 40 MG tablet Take 1 tablet (40 mg total) by mouth 2 (two) times daily. 60 tablet 1  . polyethylene glycol powder (GLYCOLAX/MIRALAX) 17 GM/SCOOP powder Take 17 g by mouth daily.    . raloxifene (EVISTA) 60 MG tablet Take 60 mg by mouth daily.     . sertraline (ZOLOFT) 50 MG tablet Take 50 mg by mouth daily.     . valsartan (DIOVAN) 80 MG tablet Take 80 mg by mouth daily.     No current facility-administered medications for this visit.   Allergies:  Avelox [moxifloxacin hcl in nacl], Penicillins, and Sulfa antibiotics   Social History: The patient  reports that she has never smoked. She has never used smokeless tobacco. She reports that she does not drink alcohol and does  not use drugs.   Family History: The patient's family history includes Cancer in her sister; Hyperlipidemia in her brother; Other in her brother and mother.   ROS:  Please see the history of present illness. Otherwise, complete review of systems is positive for none.  All other systems are reviewed and negative.   Physical Exam: VS:  BP 118/78   Pulse 71   Ht 5\' 7"  (1.702 m)   Wt 140 lb 6.4 oz (63.7 kg)   SpO2 98%   BMI 21.99 kg/m , BMI Body mass index is 21.99 kg/m.  Wt Readings from Last 3 Encounters:  02/14/20 140 lb 6.4 oz (63.7 kg)  01/17/20 138 lb 12.8 oz (63 kg)  12/16/19 140 lb 10.5 oz (63.8 kg)    General: Patient appears comfortable at rest. HEENT: Conjunctiva and lids normal, oropharynx clear with moist mucosa. Neck: Supple, no elevated JVP or carotid bruits, no thyromegaly. Lungs: Clear to auscultation, nonlabored breathing at rest. Cardiac: Regular rate and rhythm, no S3 or significant systolic murmur, no pericardial rub. Abdomen: Soft, nontender, no hepatomegaly, bowel sounds present, no guarding or rebound. Extremities: No pitting edema,  distal pulses 2+. Skin: Warm and dry. Musculoskeletal: No kyphosis. Neuropsychiatric: Alert and oriented x3, affect grossly appropriate.  ECG:  EKG 10/07/2019 [facility] EKG: Sinus rhythm rate of 70 with marked ST abnormality possible inferior subendocardial injury  Recent Labwork: 12/15/2019: ALT 11; AST 15 12/16/2019: BUN 10; Creatinine, Ser 0.51; Potassium 3.8; Sodium 140 12/18/2019: Hemoglobin 8.2; Platelets 239  No results found for: CHOL, TRIG, HDL, CHOLHDL, VLDL, LDLCALC, LDLDIRECT  Other Studies Reviewed Today:   CT Head [facility] 10/07/2019  Anatomical Region Laterality Modality  Head -- Computed Tomography    Impression Performed by [facility]   There are small inferior right temporal lobe hemorrhages and a possible left frontal pole parenchymal hemorrhage. There are moderately sized bilateral cerebral convexity and anterior interhemispheric fissure subdural effusions which are hyperdense related to prior contrast administration. No midline shift or herniation. There are small volumes of traumatic subarachnoid hemorrhage bilaterally.  Narrative Performed by [facility] CT HEAD WO CONTRAST, 10/07/2019 10:26 AM   INDICATION: Head trauma, mod-severe \ repeat for interval change \ T14.90XA Trauma   COMPARISON: None.   TECHNIQUE: Axial CT images of the brain from skull base to vertex, including portions of the face and sinuses, were obtained without contrast. Supplemental 2D reformatted images were generated and reviewed as needed.   All CT scans at [facility] and [place] are performed using dose optimization techniques as appropriate to a performed exam, including but not limited to one or more of the following: automated exposure control, adjustment of the mA and/or kV according to patient size, use of iterative reconstruction technique. In addition, [facility] is participating in the [place] program which will further assist [place] in optimizing patient radiation exposure.   FINDINGS:   . Calvarium/skull base: No evidence of acute fracture or destructive lesion. Mastoids and middle ears demonstrate no substantial mucosal disease.   . Paranasal sinuses: No air fluid levels.   . Brain: There are bilateral hyperdense cerebral convexity subdural effusions up to 1 cm in thickness and interhemispheric subdural effusion anteriorly. The hyperdensity is consistent with contrast enhancement from prior CTA exam. There is small volume of subarachnoid hemorrhage in the dependent aspect of the sylvian fissures and at the frontal poles and inferior right temporal lobe. The left frontal  pole extra-axial hemorrhage is possibly a parenchymal hemorrhage. There are small parenchymal hemorrhages of the inferior right temporal lobe and right temporal lobe edema. No acute large vascular territory infarct. There are chronic appearing lacunar infarcts of the caudate bilaterally. No mass lesion. No hydrocephalus.    . Additional Comments: None.   Residual contrast from CTA earlier the same day could mask or mimic small extra-axial hemorrhages.  Addendum  Addendum by Adela Ports, MD on 10/07/2019 11:29 AM EDT  Addendum: There is a nondisplaced midline occipital bone fracture.    Echocardiogram 10/07/2019 [facility] SUMMARY  The left ventricular size is normal.  There is normal left ventricular wall thickness.  LV ejection fraction = 60-65%.  Left ventricular systolic function is normal.  Left ventricular filling pattern is prolonged relaxation.  The right ventricle is normal in size and function.  There is aortic valve sclerosis with diffuse calcification and  restricted leaflet opening. Aortic valve gradients are not consistent  with stenosis, but the valve visually appears stenotic.  There is mild mitral annular calcification.  There is mild tricuspid  regurgitation.  No pulmonary hypertension.  The IVC is normal in size with an inspiratory collapse of greater then  50%, suggesting normal right atrial pressure.  There is no pericardial effusion.   CTA of neck 11/10/2019 [facility] There is no comparison study available.   1. Chronic atherosclerotic plaque and/or fibromuscular dysplasia involving the bilateral carotid bifurcations and mid cervical internal carotid arteries without high-grade stenosis or focal dissection.   2. Moderate to high-grade atherosclerotic narrowing of the origins of the vertebral arteries bilaterally with multifocal fibromuscular dysplasia and/or noncalcified atherosclerotic plaque induced irregularity throughout the left greater than right vertebral artery P2 and V3 segments. Chronic high-grade stenosis of the distal V4 segment of the left vertebral artery and proximal basilar artery   CTA of neck at [facility] 10/08/2019 1. Persistent luminal irregularity in the bilateral internal carotid arteries without high-grade stenosis or discrete dissection. Overall, appearance is favored to represent fibromuscular dysplasia over vascular injury or vasospasm.  2. Partial nonopacification of the left V1 and proximal V2 segments of the vertebral artery lumen are in regions of streak artifact related to contrast reflux and calcified atherosclerotic plaque and favored to be artifactual. Similar regions of luminal and contour irregularity of the remaining V2 segment of the left vertebral artery could be related to noncalcified atherosclerotic plaque versus fibromuscular dysplasia.  3. Redemonstrated high-grade stenosis of the V4 segment of the left vertebral artery and basilarartery     Assessment and Plan:   1. Atrial fibrillation, unspecified type [facility]) Denies any palpitations or arrhythmias.  Last EKG noted she was in normal sinus rhythm with heart rate of 70.  Her PCP stopped her  aspirin given recent GI bleeding with low hemoglobin and recent history of fall with CT evidence of small inferior right temporal lobe hemorrhages and possible left frontal pole parenchymal hemorrhages.  Small volumes of traumatic subarachnoid hemorrhage bilaterally.  Agree with stopping aspirin given her advanced age, recent GI bleeding, anemia, recent fall, and intracranial hemorrhages.  Continue metoprolol 25 mg p.o. twice daily.  2. Essential hypertension Blood pressure well controlled with blood pressure today of 118/78.  Continue valsartan 80 mg daily p.o.  3. Gastrointestinal hemorrhage associated with gastritis, unspecified gastritis type Recent GI bleed secondary to regular strength aspirin 325 mg started at [facility].  Eventually aspirin reduced to 81  mg by PCP.  It was subsequently stopped by PCP due to continued GI bleeding and recent low hemoglobin.  She had a head CT at recent hospital admission [facility] demonstrating small inferior right temporal lobe hemorrhages and possible left frontal pole parenchymal hemorrhages and small volumes of traumatic subarachnoid hemorrhages bilaterally.  Agree with stopping aspirin.  She states the black tarry stools have stopped but stools are still a little dark.  No more bright red blood per rectum.   Medication Adjustments/Labs and Tests Ordered: Current medicines are reviewed at length with the patient today.  Concerns regarding medicines are outlined above.   Disposition: Follow-up with Dr. Harl Bowie or APP 6 months  signed Levell July, NP 02/14/2020 2:11 PM    [facility] [place], [place], [place] [address] Phone: [phone]; Fax: [phone]

## 2020-02-14 ENCOUNTER — Ambulatory Visit (INDEPENDENT_AMBULATORY_CARE_PROVIDER_SITE_OTHER): Payer: Medicare Other | Admitting: Family Medicine

## 2020-02-14 ENCOUNTER — Encounter: Payer: Self-pay | Admitting: Family Medicine

## 2020-02-14 VITALS — BP 118/78 | HR 71 | Ht 67.0 in | Wt 140.4 lb

## 2020-02-14 DIAGNOSIS — I4891 Unspecified atrial fibrillation: Secondary | ICD-10-CM | POA: Diagnosis not present

## 2020-02-14 DIAGNOSIS — N390 Urinary tract infection, site not specified: Secondary | ICD-10-CM | POA: Diagnosis not present

## 2020-02-14 DIAGNOSIS — K922 Gastrointestinal hemorrhage, unspecified: Secondary | ICD-10-CM

## 2020-02-14 DIAGNOSIS — I10 Essential (primary) hypertension: Secondary | ICD-10-CM

## 2020-02-14 DIAGNOSIS — Z299 Encounter for prophylactic measures, unspecified: Secondary | ICD-10-CM | POA: Diagnosis not present

## 2020-02-14 NOTE — Patient Instructions (Signed)
Medication Instructions:   Remain off of the Aspirin   Continue all other current medications.  Labwork: none  Testing/Procedures: none  Follow-Up: 6 months   Any Other Special Instructions Will Be Listed Below (If Applicable).  If you need a refill on your cardiac medications before your next appointment, please call your pharmacy.

## 2020-03-05 DIAGNOSIS — I10 Essential (primary) hypertension: Secondary | ICD-10-CM | POA: Diagnosis not present

## 2020-04-02 DIAGNOSIS — G309 Alzheimer's disease, unspecified: Secondary | ICD-10-CM | POA: Diagnosis not present

## 2020-04-02 DIAGNOSIS — Z299 Encounter for prophylactic measures, unspecified: Secondary | ICD-10-CM | POA: Diagnosis not present

## 2020-04-02 DIAGNOSIS — I4891 Unspecified atrial fibrillation: Secondary | ICD-10-CM | POA: Diagnosis not present

## 2020-04-02 DIAGNOSIS — I10 Essential (primary) hypertension: Secondary | ICD-10-CM | POA: Diagnosis not present

## 2020-04-02 DIAGNOSIS — N760 Acute vaginitis: Secondary | ICD-10-CM | POA: Diagnosis not present

## 2020-04-02 DIAGNOSIS — K219 Gastro-esophageal reflux disease without esophagitis: Secondary | ICD-10-CM | POA: Diagnosis not present

## 2020-04-03 DIAGNOSIS — I10 Essential (primary) hypertension: Secondary | ICD-10-CM | POA: Diagnosis not present

## 2020-04-16 DIAGNOSIS — N390 Urinary tract infection, site not specified: Secondary | ICD-10-CM | POA: Diagnosis not present

## 2020-04-16 DIAGNOSIS — B373 Candidiasis of vulva and vagina: Secondary | ICD-10-CM | POA: Diagnosis not present

## 2020-04-16 DIAGNOSIS — Z299 Encounter for prophylactic measures, unspecified: Secondary | ICD-10-CM | POA: Diagnosis not present

## 2020-04-16 DIAGNOSIS — I719 Aortic aneurysm of unspecified site, without rupture: Secondary | ICD-10-CM | POA: Diagnosis not present

## 2020-04-16 DIAGNOSIS — I10 Essential (primary) hypertension: Secondary | ICD-10-CM | POA: Diagnosis not present

## 2020-04-16 DIAGNOSIS — R300 Dysuria: Secondary | ICD-10-CM | POA: Diagnosis not present

## 2020-05-30 DIAGNOSIS — H26493 Other secondary cataract, bilateral: Secondary | ICD-10-CM | POA: Diagnosis not present

## 2020-05-30 DIAGNOSIS — H524 Presbyopia: Secondary | ICD-10-CM | POA: Diagnosis not present

## 2020-05-30 DIAGNOSIS — Z961 Presence of intraocular lens: Secondary | ICD-10-CM | POA: Diagnosis not present

## 2020-05-30 DIAGNOSIS — H353221 Exudative age-related macular degeneration, left eye, with active choroidal neovascularization: Secondary | ICD-10-CM | POA: Diagnosis not present

## 2020-05-30 DIAGNOSIS — H353112 Nonexudative age-related macular degeneration, right eye, intermediate dry stage: Secondary | ICD-10-CM | POA: Diagnosis not present

## 2020-06-26 DIAGNOSIS — I4891 Unspecified atrial fibrillation: Secondary | ICD-10-CM | POA: Diagnosis not present

## 2020-06-26 DIAGNOSIS — L299 Pruritus, unspecified: Secondary | ICD-10-CM | POA: Diagnosis not present

## 2020-06-26 DIAGNOSIS — Z299 Encounter for prophylactic measures, unspecified: Secondary | ICD-10-CM | POA: Diagnosis not present

## 2020-06-26 DIAGNOSIS — G309 Alzheimer's disease, unspecified: Secondary | ICD-10-CM | POA: Diagnosis not present

## 2020-06-26 DIAGNOSIS — I10 Essential (primary) hypertension: Secondary | ICD-10-CM | POA: Diagnosis not present

## 2020-07-02 DIAGNOSIS — H43393 Other vitreous opacities, bilateral: Secondary | ICD-10-CM | POA: Diagnosis not present

## 2020-07-02 DIAGNOSIS — H353223 Exudative age-related macular degeneration, left eye, with inactive scar: Secondary | ICD-10-CM | POA: Diagnosis not present

## 2020-07-02 DIAGNOSIS — H353113 Nonexudative age-related macular degeneration, right eye, advanced atrophic without subfoveal involvement: Secondary | ICD-10-CM | POA: Diagnosis not present

## 2020-07-02 DIAGNOSIS — H43813 Vitreous degeneration, bilateral: Secondary | ICD-10-CM | POA: Diagnosis not present

## 2020-07-30 DIAGNOSIS — R350 Frequency of micturition: Secondary | ICD-10-CM | POA: Diagnosis not present

## 2020-07-30 DIAGNOSIS — R0602 Shortness of breath: Secondary | ICD-10-CM | POA: Diagnosis not present

## 2020-07-30 DIAGNOSIS — I10 Essential (primary) hypertension: Secondary | ICD-10-CM | POA: Diagnosis not present

## 2020-07-30 DIAGNOSIS — N390 Urinary tract infection, site not specified: Secondary | ICD-10-CM | POA: Diagnosis not present

## 2020-07-30 DIAGNOSIS — D649 Anemia, unspecified: Secondary | ICD-10-CM | POA: Diagnosis not present

## 2020-07-30 DIAGNOSIS — Z299 Encounter for prophylactic measures, unspecified: Secondary | ICD-10-CM | POA: Diagnosis not present

## 2020-08-01 DIAGNOSIS — D649 Anemia, unspecified: Secondary | ICD-10-CM | POA: Diagnosis not present

## 2020-08-06 DIAGNOSIS — I483 Typical atrial flutter: Secondary | ICD-10-CM | POA: Diagnosis not present

## 2020-08-14 ENCOUNTER — Ambulatory Visit (INDEPENDENT_AMBULATORY_CARE_PROVIDER_SITE_OTHER): Payer: Medicare Other | Admitting: Cardiology

## 2020-08-14 ENCOUNTER — Other Ambulatory Visit: Payer: Self-pay

## 2020-08-14 ENCOUNTER — Encounter: Payer: Self-pay | Admitting: Cardiology

## 2020-08-14 VITALS — BP 118/60 | HR 73 | Ht 68.0 in | Wt 135.0 lb

## 2020-08-14 DIAGNOSIS — Z952 Presence of prosthetic heart valve: Secondary | ICD-10-CM | POA: Diagnosis not present

## 2020-08-14 DIAGNOSIS — I10 Essential (primary) hypertension: Secondary | ICD-10-CM | POA: Diagnosis not present

## 2020-08-14 DIAGNOSIS — I4891 Unspecified atrial fibrillation: Secondary | ICD-10-CM | POA: Diagnosis not present

## 2020-08-14 NOTE — Patient Instructions (Addendum)
Medication Instructions:  Continue all current medications.   Labwork: none  Testing/Procedures: none  Follow-Up: 6 months   Any Other Special Instructions Will Be Listed Below (If Applicable).   If you need a refill on your cardiac medications before your next appointment, please call your pharmacy.  

## 2020-08-14 NOTE — Progress Notes (Signed)
Clinical Summary Lauren Morrow is a 87 y.o.female seen today for follow up of the following medical problems.   1. Abdominal aortic aneurysm   - prior repair 11/2011, she reports signed off by vascuar.     2. Severe aortic stenosis   - s/p AVR with a bioprosthetic valve Nov 2012 - had echo 10/2019 at [facility], LVEF 60-65%, normal AVR    - some SOB improved since recent transfusion - no chest pains.    3. HTN - compliant with meds   4. GI bleed - reports admission few months ago at [facility] - ASA was stopped  - another admit 12/2019 with recetal bleeding - had been on ASA again at the time - EGDs have shown evidence of erosive gastropathy      5. Afib - no recent palptations.  - no anticoag, history of GI bleed. Also prior fall with evidence of parenchymal hemorrhag by imaging.   Past Medical History:  Diagnosis Date   Anemia    hx of   Anxiety    Aortic stenosis    Dr. Rod Morrow, saw last 09/11/11   Arthritis    Atrial fibrillation ([place])    Post op, sees  Dr. Fletcher Morrow   Cataracts, bilateral    Dizziness    GERD (gastroesophageal reflux disease)    Hypertension    sees Dr. Woody Morrow, primary   Low back pain    Osteomyelitis ([place])    history of osteomyelistis in the leg   Peripheral vascular disease ([place])    Seasonal allergies    Shortness of breath    "gets short of breath at time since the heart surgery 2012"   Urinary tract infection    hx of   Varicose veins      Allergies  Allergen Reactions   Avelox [Moxifloxacin Hcl In Nacl] Rash    Avelox began at approximately 0800. At 0807 pt noted to have red-streaking proximal to the PIV site, up the left arm, to the neck/chest area. Avelox immediately discontinued. Pt received about 1/4 the dose or '100mg'$ . Vital signs remained stable. The anesthesiologist and surgeon were notified and shown the site of red, rashy streaking.   Penicillins Swelling   Sulfa Antibiotics Other (See Comments)    unknown      Current Outpatient Medications  Medication Sig Dispense Refill   fish oil-omega-3 fatty acids 1000 MG capsule Take 1 g by mouth daily.     Garlic [id number] MG CAPS Take 1,000 mg by mouth daily.     levothyroxine (SYNTHROID, LEVOTHROID) 50 MCG tablet Take 50 mcg by mouth daily before breakfast.     metoprolol tartrate (LOPRESSOR) 25 MG tablet TAKE ONE TABLET BY MOUTH TWICE DAILY 180 tablet 3   Multiple Vitamin (MULTIVITAMIN) tablet Take 1 tablet by mouth daily.       Multiple Vitamins-Minerals (PRESERVISION AREDS 2) CAPS Take 1 capsule by mouth daily.     pantoprazole (PROTONIX) 40 MG tablet Take 1 tablet (40 mg total) by mouth 2 (two) times daily. 60 tablet 1   polyethylene glycol powder (GLYCOLAX/MIRALAX) 17 GM/SCOOP powder Take 17 g by mouth daily.     raloxifene (EVISTA) 60 MG tablet Take 60 mg by mouth daily.      sertraline (ZOLOFT) 50 MG tablet Take 50 mg by mouth daily.      valsartan (DIOVAN) 80 MG tablet Take 80 mg by mouth daily.     No current facility-administered medications for this  visit.     Past Surgical History:  Procedure Laterality Date   ABDOMINAL AORTIC ANEURYSM REPAIR  11/21/2011   Procedure: ANEURYSM ABDOMINAL AORTIC REPAIR;  Surgeon: Lauren Misty, Morrow;  Location: [facility] OR;  Service: Vascular;  Laterality: N/A;  using 16 x 8 mm x 40 cm hemashield graft   AORTIC VALVE REPLACEMENT  11/22/2010   26m pericardial valve serial#[id number], model [id number]   AORTIC VALVE REPLACEMENT     In 2012 with a bioprosthetic valve   bladder tact     1960's   CARDIAC CATHETERIZATION     CATARACT EXTRACTION W/PHACO Right 12/09/2012   Procedure: CATARACT EXTRACTION PHACO AND INTRAOCULAR LENS PLACEMENT [place]  Surgeon: KTonny Bader Stubblefield Morrow;  Location: [facility];  Service: Ophthalmology;  Laterality: Right;  CDE 19.32   CATARACT EXTRACTION W/PHACO Left 12/27/2012   Procedure: LEFT EYE CATARACT EXTRACTION PHACO AND INTRAOCULAR LENS PLACEMENT ;  Surgeon: KTonny Mack Thurmon Morrow;  Location: [facility];   Service: Ophthalmology;  Laterality: Left;  CDE 10.78   ESOPHAGOGASTRODUODENOSCOPY (EGD) WITH PROPOFOL N/A 12/16/2019   Procedure: ESOPHAGOGASTRODUODENOSCOPY (EGD) WITH PROPOFOL;  Surgeon: Lauren Quale Morrow;  Location: [facility];  Service: Gastroenterology;  Laterality: N/A;   LAPAROTOMY  11/21/2011   Procedure: EXPLORATORY LAPAROTOMY;  Surgeon: Lauren Misty Morrow;  [place]  Service: Vascular;  Laterality: N/A;  exploratory laparotomy, evacuation of hematoma, suture ligation of lumbar arterial Lauren Morrow    PATCH ANGIOPLASTY  11/21/2011   Procedure: PATCH ANGIOPLASTY;  Surgeon: Lauren Misty Morrow;  [place]  Service: Vascular;  Laterality: Right;  Iliac vein   [place]. [place]an [place]row    Allergies  Allergen Reactions   Avelox [Moxifloxacin Hcl In Nacl] Rash    Avelox began at approximately 0800. At 0807 pt noted to have red-streaking proximal to the PIV site, up the left arm, to the neck/chest area. Avelox immediately discontinued. Pt received about 1/4 the dose or '100mg'$ . Vital signs remained stable. The anesthesiologist and surgeon were notified and shown the site of red, rashy streaking.   Penicillins Swelling   Sulfa Antibiotics Other (See Comments)    unknown      Family History  Problem Relation Age of Onset   Other Mother        Varicose Veins   Cancer Sister    Hyperlipidemia Brother    Other Brother      Social History Ms. Lauren Morrow reports that she has never smoked. She has never used smokeless tobacco. Ms. [place]ef[place] no history of alcohol use.   Review of Systems CONSTITUTIONAL: No weight loss, fever, chills, weakness or fatigue.  HEENT: Eyes: No visual loss, blurred vision, double vision or yellow sclerae.No hearing loss, sneezing, congestion, runny nose or sore throat.  SKIN: No rash or itching.  CARDIOVASCULAR: per hpi RESPIRATORY: No shortness of breath, cough or sputum.  GASTROINTESTINAL: No anorexia, nausea,  vomiting or diarrhea. No abdominal pain or blood.  GENITOURINARY: No burning on urination, no polyuria NEUROLOGICAL: No headache, dizziness, syncope, paralysis, ataxia, numbness or tingling in the extremities. No change in bowel or bladder control.  MUSCULOSKELETAL: No muscle, back pain, joint pain or stiffness.  LYMPHATICS: No enlarged nodes. No history of splenectomy.  PSYCHIATRIC: No history of depression or anxiety.  ENDOCRINOLOGIC: No reports of sweating, cold or heat intolerance. No polyuria or polydipsia.  .Marland Kitchen  Physical Examination Today's Vitals   08/14/20 1407  BP: 118/60  Pulse: 73  SpO2: 96%  Weight: 135 lb (61.2 kg)  Height: '5\' 8"'$  (1.727 m)   Body mass index is 20.53 kg/m.  Gen: resting comfortably, no acute distress HEENT: no scleral icterus, pupils equal round and reactive, no palptable cervical adenopathy,  CV: RRR, 2/6 systolic murmur rusb, no jvd Resp: Clear to auscultation bilaterally GI: abdomen is soft, non-tender, non-distended, normal bowel sounds, no hepatosplenomegaly MSK: extremities are warm, no edema.  Skin: warm, no rash Neuro:  no focal deficits Psych: appropriate affect   Diagnostic Studies  11/2011 Echo: LVEF [id number], grade I diastolic dysfunction, normal AVR, mild MR, mild TR   10/2019 echo SUMMARY  The left ventricular size is normal.  There is normal left ventricular wall thickness.  LV ejection fraction = 60-65%.  Left ventricular systolic function is normal.  Left ventricular filling pattern is prolonged relaxation.  The right ventricle is normal in size and function.  There is aortic valve sclerosis with diffuse calcification and  restricted leaflet opening. Aortic valve gradients are not consistent  with stenosis, but the valve visually appears stenotic.  There is mild mitral annular calcification.  There is mild tricuspid regurgitation.  No pulmonary hypertension.  The IVC is normal in size with an inspiratory collapse of  greater then  50%, suggesting normal right atrial pressure.  There is no pericardial effusion.   Assessment and Plan  1. Aortic stenosis s/p valve replacement -no recent symptoms - echo at [facility] showed normal AVR - continue to monitor - off ASA due to recurrent GI bleeding   2. HTN -at goal, cotninue current meds  3. Afib - no symptoms, continue metoprolol - off anticoag and antiplatelet due to recurrent GI bleeding, prior fall with intracranial bleed      Arnoldo Lenis, M.D.

## 2020-08-29 DIAGNOSIS — Z6820 Body mass index (BMI) 20.0-20.9, adult: Secondary | ICD-10-CM | POA: Diagnosis not present

## 2020-08-29 DIAGNOSIS — Z1339 Encounter for screening examination for other mental health and behavioral disorders: Secondary | ICD-10-CM | POA: Diagnosis not present

## 2020-08-29 DIAGNOSIS — E7800 Pure hypercholesterolemia, unspecified: Secondary | ICD-10-CM | POA: Diagnosis not present

## 2020-08-29 DIAGNOSIS — Z0000 Encounter for general adult medical examination without abnormal findings: Secondary | ICD-10-CM | POA: Diagnosis not present

## 2020-08-29 DIAGNOSIS — G43809 Other migraine, not intractable, without status migrainosus: Secondary | ICD-10-CM | POA: Diagnosis not present

## 2020-08-29 DIAGNOSIS — N390 Urinary tract infection, site not specified: Secondary | ICD-10-CM | POA: Diagnosis not present

## 2020-08-29 DIAGNOSIS — F339 Major depressive disorder, recurrent, unspecified: Secondary | ICD-10-CM | POA: Diagnosis not present

## 2020-08-29 DIAGNOSIS — I10 Essential (primary) hypertension: Secondary | ICD-10-CM | POA: Diagnosis not present

## 2020-08-29 DIAGNOSIS — R5383 Other fatigue: Secondary | ICD-10-CM | POA: Diagnosis not present

## 2020-08-29 DIAGNOSIS — Z789 Other specified health status: Secondary | ICD-10-CM | POA: Diagnosis not present

## 2020-08-29 DIAGNOSIS — E039 Hypothyroidism, unspecified: Secondary | ICD-10-CM | POA: Diagnosis not present

## 2020-08-29 DIAGNOSIS — Z1331 Encounter for screening for depression: Secondary | ICD-10-CM | POA: Diagnosis not present

## 2020-08-29 DIAGNOSIS — Z79899 Other long term (current) drug therapy: Secondary | ICD-10-CM | POA: Diagnosis not present

## 2020-08-29 DIAGNOSIS — R519 Headache, unspecified: Secondary | ICD-10-CM | POA: Diagnosis not present

## 2020-08-29 DIAGNOSIS — Z299 Encounter for prophylactic measures, unspecified: Secondary | ICD-10-CM | POA: Diagnosis not present

## 2020-08-29 DIAGNOSIS — Z7189 Other specified counseling: Secondary | ICD-10-CM | POA: Diagnosis not present

## 2020-08-30 DIAGNOSIS — H353114 Nonexudative age-related macular degeneration, right eye, advanced atrophic with subfoveal involvement: Secondary | ICD-10-CM | POA: Diagnosis not present

## 2020-08-30 DIAGNOSIS — H43813 Vitreous degeneration, bilateral: Secondary | ICD-10-CM | POA: Diagnosis not present

## 2020-08-30 DIAGNOSIS — H43393 Other vitreous opacities, bilateral: Secondary | ICD-10-CM | POA: Diagnosis not present

## 2020-08-30 DIAGNOSIS — H353223 Exudative age-related macular degeneration, left eye, with inactive scar: Secondary | ICD-10-CM | POA: Diagnosis not present

## 2020-09-05 DIAGNOSIS — D649 Anemia, unspecified: Secondary | ICD-10-CM | POA: Diagnosis not present

## 2020-09-07 DIAGNOSIS — Z299 Encounter for prophylactic measures, unspecified: Secondary | ICD-10-CM | POA: Diagnosis not present

## 2020-09-07 DIAGNOSIS — R1030 Lower abdominal pain, unspecified: Secondary | ICD-10-CM | POA: Diagnosis not present

## 2020-09-07 DIAGNOSIS — N390 Urinary tract infection, site not specified: Secondary | ICD-10-CM | POA: Diagnosis not present

## 2020-09-07 DIAGNOSIS — I4891 Unspecified atrial fibrillation: Secondary | ICD-10-CM | POA: Diagnosis not present

## 2020-09-07 DIAGNOSIS — I10 Essential (primary) hypertension: Secondary | ICD-10-CM | POA: Diagnosis not present

## 2020-09-25 DIAGNOSIS — D649 Anemia, unspecified: Secondary | ICD-10-CM | POA: Diagnosis not present

## 2020-11-08 DIAGNOSIS — Z6820 Body mass index (BMI) 20.0-20.9, adult: Secondary | ICD-10-CM | POA: Diagnosis not present

## 2020-11-08 DIAGNOSIS — R519 Headache, unspecified: Secondary | ICD-10-CM | POA: Diagnosis not present

## 2020-11-08 DIAGNOSIS — I10 Essential (primary) hypertension: Secondary | ICD-10-CM | POA: Diagnosis not present

## 2020-11-08 DIAGNOSIS — N390 Urinary tract infection, site not specified: Secondary | ICD-10-CM | POA: Diagnosis not present

## 2020-11-08 DIAGNOSIS — Z299 Encounter for prophylactic measures, unspecified: Secondary | ICD-10-CM | POA: Diagnosis not present

## 2020-11-20 DIAGNOSIS — I10 Essential (primary) hypertension: Secondary | ICD-10-CM | POA: Diagnosis not present

## 2020-11-20 DIAGNOSIS — R300 Dysuria: Secondary | ICD-10-CM | POA: Diagnosis not present

## 2020-11-20 DIAGNOSIS — Z299 Encounter for prophylactic measures, unspecified: Secondary | ICD-10-CM | POA: Diagnosis not present

## 2020-11-20 DIAGNOSIS — R5383 Other fatigue: Secondary | ICD-10-CM | POA: Diagnosis not present

## 2020-11-20 DIAGNOSIS — D649 Anemia, unspecified: Secondary | ICD-10-CM | POA: Diagnosis not present

## 2020-12-06 DIAGNOSIS — H353223 Exudative age-related macular degeneration, left eye, with inactive scar: Secondary | ICD-10-CM | POA: Diagnosis not present

## 2020-12-06 DIAGNOSIS — H353114 Nonexudative age-related macular degeneration, right eye, advanced atrophic with subfoveal involvement: Secondary | ICD-10-CM | POA: Diagnosis not present

## 2020-12-06 DIAGNOSIS — H43813 Vitreous degeneration, bilateral: Secondary | ICD-10-CM | POA: Diagnosis not present

## 2020-12-06 DIAGNOSIS — H43393 Other vitreous opacities, bilateral: Secondary | ICD-10-CM | POA: Diagnosis not present

## 2021-02-22 DIAGNOSIS — D649 Anemia, unspecified: Secondary | ICD-10-CM | POA: Diagnosis not present

## 2021-02-22 DIAGNOSIS — F0280 Dementia in other diseases classified elsewhere without behavioral disturbance: Secondary | ICD-10-CM | POA: Diagnosis not present

## 2021-02-22 DIAGNOSIS — G309 Alzheimer's disease, unspecified: Secondary | ICD-10-CM | POA: Diagnosis not present

## 2021-02-22 DIAGNOSIS — I10 Essential (primary) hypertension: Secondary | ICD-10-CM | POA: Diagnosis not present

## 2021-02-22 DIAGNOSIS — Z299 Encounter for prophylactic measures, unspecified: Secondary | ICD-10-CM | POA: Diagnosis not present

## 2021-02-22 DIAGNOSIS — D62 Acute posthemorrhagic anemia: Secondary | ICD-10-CM | POA: Diagnosis not present

## 2021-02-25 DIAGNOSIS — F0390 Unspecified dementia without behavioral disturbance: Secondary | ICD-10-CM | POA: Diagnosis not present

## 2021-02-25 DIAGNOSIS — R519 Headache, unspecified: Secondary | ICD-10-CM | POA: Diagnosis not present

## 2021-02-25 DIAGNOSIS — R200 Anesthesia of skin: Secondary | ICD-10-CM | POA: Diagnosis not present

## 2021-02-25 DIAGNOSIS — Z299 Encounter for prophylactic measures, unspecified: Secondary | ICD-10-CM | POA: Diagnosis not present

## 2021-02-25 DIAGNOSIS — I6381 Other cerebral infarction due to occlusion or stenosis of small artery: Secondary | ICD-10-CM | POA: Diagnosis not present

## 2021-02-25 DIAGNOSIS — I6782 Cerebral ischemia: Secondary | ICD-10-CM | POA: Diagnosis not present

## 2021-02-25 DIAGNOSIS — I10 Essential (primary) hypertension: Secondary | ICD-10-CM | POA: Diagnosis not present

## 2021-02-25 DIAGNOSIS — G459 Transient cerebral ischemic attack, unspecified: Secondary | ICD-10-CM | POA: Diagnosis not present

## 2021-02-25 DIAGNOSIS — R9089 Other abnormal findings on diagnostic imaging of central nervous system: Secondary | ICD-10-CM | POA: Diagnosis not present

## 2021-02-26 ENCOUNTER — Ambulatory Visit: Payer: Medicare Other | Admitting: Cardiology

## 2021-02-26 DIAGNOSIS — G459 Transient cerebral ischemic attack, unspecified: Secondary | ICD-10-CM | POA: Diagnosis not present

## 2021-02-26 DIAGNOSIS — I4891 Unspecified atrial fibrillation: Secondary | ICD-10-CM | POA: Diagnosis not present

## 2021-02-26 DIAGNOSIS — I639 Cerebral infarction, unspecified: Secondary | ICD-10-CM | POA: Diagnosis not present

## 2021-02-26 DIAGNOSIS — K922 Gastrointestinal hemorrhage, unspecified: Secondary | ICD-10-CM | POA: Diagnosis not present

## 2021-03-02 DIAGNOSIS — Z87440 Personal history of urinary (tract) infections: Secondary | ICD-10-CM | POA: Diagnosis not present

## 2021-03-02 DIAGNOSIS — M1990 Unspecified osteoarthritis, unspecified site: Secondary | ICD-10-CM | POA: Diagnosis not present

## 2021-03-02 DIAGNOSIS — Z8673 Personal history of transient ischemic attack (TIA), and cerebral infarction without residual deficits: Secondary | ICD-10-CM | POA: Diagnosis not present

## 2021-03-02 DIAGNOSIS — Z952 Presence of prosthetic heart valve: Secondary | ICD-10-CM | POA: Diagnosis not present

## 2021-03-02 DIAGNOSIS — I739 Peripheral vascular disease, unspecified: Secondary | ICD-10-CM | POA: Diagnosis not present

## 2021-03-02 DIAGNOSIS — F331 Major depressive disorder, recurrent, moderate: Secondary | ICD-10-CM | POA: Diagnosis not present

## 2021-03-02 DIAGNOSIS — H6980 Other specified disorders of Eustachian tube, unspecified ear: Secondary | ICD-10-CM | POA: Diagnosis not present

## 2021-03-02 DIAGNOSIS — D692 Other nonthrombocytopenic purpura: Secondary | ICD-10-CM | POA: Diagnosis not present

## 2021-03-02 DIAGNOSIS — E2839 Other primary ovarian failure: Secondary | ICD-10-CM | POA: Diagnosis not present

## 2021-03-02 DIAGNOSIS — R350 Frequency of micturition: Secondary | ICD-10-CM | POA: Diagnosis not present

## 2021-03-02 DIAGNOSIS — R32 Unspecified urinary incontinence: Secondary | ICD-10-CM | POA: Diagnosis not present

## 2021-03-02 DIAGNOSIS — I10 Essential (primary) hypertension: Secondary | ICD-10-CM | POA: Diagnosis not present

## 2021-03-02 DIAGNOSIS — E039 Hypothyroidism, unspecified: Secondary | ICD-10-CM | POA: Diagnosis not present

## 2021-03-02 DIAGNOSIS — E7800 Pure hypercholesterolemia, unspecified: Secondary | ICD-10-CM | POA: Diagnosis not present

## 2021-03-02 DIAGNOSIS — I700 Atherosclerosis of aorta: Secondary | ICD-10-CM | POA: Diagnosis not present

## 2021-03-02 DIAGNOSIS — F0280 Dementia in other diseases classified elsewhere without behavioral disturbance: Secondary | ICD-10-CM | POA: Diagnosis not present

## 2021-03-02 DIAGNOSIS — I8390 Asymptomatic varicose veins of unspecified lower extremity: Secondary | ICD-10-CM | POA: Diagnosis not present

## 2021-03-02 DIAGNOSIS — M810 Age-related osteoporosis without current pathological fracture: Secondary | ICD-10-CM | POA: Diagnosis not present

## 2021-03-02 DIAGNOSIS — G309 Alzheimer's disease, unspecified: Secondary | ICD-10-CM | POA: Diagnosis not present

## 2021-03-02 DIAGNOSIS — I69354 Hemiplegia and hemiparesis following cerebral infarction affecting left non-dominant side: Secondary | ICD-10-CM | POA: Diagnosis not present

## 2021-03-02 DIAGNOSIS — D649 Anemia, unspecified: Secondary | ICD-10-CM | POA: Diagnosis not present

## 2021-03-02 DIAGNOSIS — I4891 Unspecified atrial fibrillation: Secondary | ICD-10-CM | POA: Diagnosis not present

## 2021-03-02 DIAGNOSIS — K219 Gastro-esophageal reflux disease without esophagitis: Secondary | ICD-10-CM | POA: Diagnosis not present

## 2021-03-02 DIAGNOSIS — J309 Allergic rhinitis, unspecified: Secondary | ICD-10-CM | POA: Diagnosis not present

## 2021-03-06 DIAGNOSIS — I10 Essential (primary) hypertension: Secondary | ICD-10-CM | POA: Diagnosis not present

## 2021-03-06 DIAGNOSIS — R32 Unspecified urinary incontinence: Secondary | ICD-10-CM | POA: Diagnosis not present

## 2021-03-06 DIAGNOSIS — Z299 Encounter for prophylactic measures, unspecified: Secondary | ICD-10-CM | POA: Diagnosis not present

## 2021-03-07 DIAGNOSIS — I69354 Hemiplegia and hemiparesis following cerebral infarction affecting left non-dominant side: Secondary | ICD-10-CM | POA: Diagnosis not present

## 2021-03-07 DIAGNOSIS — F0280 Dementia in other diseases classified elsewhere without behavioral disturbance: Secondary | ICD-10-CM | POA: Diagnosis not present

## 2021-03-07 DIAGNOSIS — R32 Unspecified urinary incontinence: Secondary | ICD-10-CM | POA: Diagnosis not present

## 2021-03-07 DIAGNOSIS — G309 Alzheimer's disease, unspecified: Secondary | ICD-10-CM | POA: Diagnosis not present

## 2021-03-07 DIAGNOSIS — I4891 Unspecified atrial fibrillation: Secondary | ICD-10-CM | POA: Diagnosis not present

## 2021-03-07 DIAGNOSIS — I10 Essential (primary) hypertension: Secondary | ICD-10-CM | POA: Diagnosis not present

## 2021-03-12 DIAGNOSIS — R32 Unspecified urinary incontinence: Secondary | ICD-10-CM | POA: Diagnosis not present

## 2021-03-12 DIAGNOSIS — G309 Alzheimer's disease, unspecified: Secondary | ICD-10-CM | POA: Diagnosis not present

## 2021-03-12 DIAGNOSIS — I10 Essential (primary) hypertension: Secondary | ICD-10-CM | POA: Diagnosis not present

## 2021-03-12 DIAGNOSIS — I4891 Unspecified atrial fibrillation: Secondary | ICD-10-CM | POA: Diagnosis not present

## 2021-03-12 DIAGNOSIS — F0280 Dementia in other diseases classified elsewhere without behavioral disturbance: Secondary | ICD-10-CM | POA: Diagnosis not present

## 2021-03-12 DIAGNOSIS — I69354 Hemiplegia and hemiparesis following cerebral infarction affecting left non-dominant side: Secondary | ICD-10-CM | POA: Diagnosis not present

## 2021-03-13 DIAGNOSIS — G309 Alzheimer's disease, unspecified: Secondary | ICD-10-CM | POA: Diagnosis not present

## 2021-03-13 DIAGNOSIS — I4891 Unspecified atrial fibrillation: Secondary | ICD-10-CM | POA: Diagnosis not present

## 2021-03-13 DIAGNOSIS — I10 Essential (primary) hypertension: Secondary | ICD-10-CM | POA: Diagnosis not present

## 2021-03-13 DIAGNOSIS — R32 Unspecified urinary incontinence: Secondary | ICD-10-CM | POA: Diagnosis not present

## 2021-03-13 DIAGNOSIS — I69354 Hemiplegia and hemiparesis following cerebral infarction affecting left non-dominant side: Secondary | ICD-10-CM | POA: Diagnosis not present

## 2021-03-13 DIAGNOSIS — F0280 Dementia in other diseases classified elsewhere without behavioral disturbance: Secondary | ICD-10-CM | POA: Diagnosis not present

## 2021-03-14 DIAGNOSIS — I4891 Unspecified atrial fibrillation: Secondary | ICD-10-CM | POA: Diagnosis not present

## 2021-03-14 DIAGNOSIS — I10 Essential (primary) hypertension: Secondary | ICD-10-CM | POA: Diagnosis not present

## 2021-03-14 DIAGNOSIS — F0280 Dementia in other diseases classified elsewhere without behavioral disturbance: Secondary | ICD-10-CM | POA: Diagnosis not present

## 2021-03-14 DIAGNOSIS — G309 Alzheimer's disease, unspecified: Secondary | ICD-10-CM | POA: Diagnosis not present

## 2021-03-14 DIAGNOSIS — R32 Unspecified urinary incontinence: Secondary | ICD-10-CM | POA: Diagnosis not present

## 2021-03-14 DIAGNOSIS — I69354 Hemiplegia and hemiparesis following cerebral infarction affecting left non-dominant side: Secondary | ICD-10-CM | POA: Diagnosis not present

## 2021-03-18 DIAGNOSIS — F0280 Dementia in other diseases classified elsewhere without behavioral disturbance: Secondary | ICD-10-CM | POA: Diagnosis not present

## 2021-03-18 DIAGNOSIS — R32 Unspecified urinary incontinence: Secondary | ICD-10-CM | POA: Diagnosis not present

## 2021-03-18 DIAGNOSIS — G309 Alzheimer's disease, unspecified: Secondary | ICD-10-CM | POA: Diagnosis not present

## 2021-03-18 DIAGNOSIS — I69354 Hemiplegia and hemiparesis following cerebral infarction affecting left non-dominant side: Secondary | ICD-10-CM | POA: Diagnosis not present

## 2021-03-18 DIAGNOSIS — I4891 Unspecified atrial fibrillation: Secondary | ICD-10-CM | POA: Diagnosis not present

## 2021-03-18 DIAGNOSIS — I10 Essential (primary) hypertension: Secondary | ICD-10-CM | POA: Diagnosis not present

## 2021-03-21 DIAGNOSIS — F0280 Dementia in other diseases classified elsewhere without behavioral disturbance: Secondary | ICD-10-CM | POA: Diagnosis not present

## 2021-03-21 DIAGNOSIS — R32 Unspecified urinary incontinence: Secondary | ICD-10-CM | POA: Diagnosis not present

## 2021-03-21 DIAGNOSIS — I10 Essential (primary) hypertension: Secondary | ICD-10-CM | POA: Diagnosis not present

## 2021-03-21 DIAGNOSIS — G309 Alzheimer's disease, unspecified: Secondary | ICD-10-CM | POA: Diagnosis not present

## 2021-03-21 DIAGNOSIS — I4891 Unspecified atrial fibrillation: Secondary | ICD-10-CM | POA: Diagnosis not present

## 2021-03-21 DIAGNOSIS — I69354 Hemiplegia and hemiparesis following cerebral infarction affecting left non-dominant side: Secondary | ICD-10-CM | POA: Diagnosis not present

## 2021-03-26 DIAGNOSIS — F0280 Dementia in other diseases classified elsewhere without behavioral disturbance: Secondary | ICD-10-CM | POA: Diagnosis not present

## 2021-03-26 DIAGNOSIS — I69354 Hemiplegia and hemiparesis following cerebral infarction affecting left non-dominant side: Secondary | ICD-10-CM | POA: Diagnosis not present

## 2021-03-26 DIAGNOSIS — R32 Unspecified urinary incontinence: Secondary | ICD-10-CM | POA: Diagnosis not present

## 2021-03-26 DIAGNOSIS — I10 Essential (primary) hypertension: Secondary | ICD-10-CM | POA: Diagnosis not present

## 2021-03-26 DIAGNOSIS — I4891 Unspecified atrial fibrillation: Secondary | ICD-10-CM | POA: Diagnosis not present

## 2021-03-26 DIAGNOSIS — G309 Alzheimer's disease, unspecified: Secondary | ICD-10-CM | POA: Diagnosis not present

## 2021-03-27 DIAGNOSIS — I4891 Unspecified atrial fibrillation: Secondary | ICD-10-CM | POA: Diagnosis not present

## 2021-03-27 DIAGNOSIS — I69354 Hemiplegia and hemiparesis following cerebral infarction affecting left non-dominant side: Secondary | ICD-10-CM | POA: Diagnosis not present

## 2021-03-27 DIAGNOSIS — R32 Unspecified urinary incontinence: Secondary | ICD-10-CM | POA: Diagnosis not present

## 2021-03-27 DIAGNOSIS — F0280 Dementia in other diseases classified elsewhere without behavioral disturbance: Secondary | ICD-10-CM | POA: Diagnosis not present

## 2021-03-27 DIAGNOSIS — G309 Alzheimer's disease, unspecified: Secondary | ICD-10-CM | POA: Diagnosis not present

## 2021-03-27 DIAGNOSIS — I10 Essential (primary) hypertension: Secondary | ICD-10-CM | POA: Diagnosis not present

## 2021-03-29 DIAGNOSIS — I10 Essential (primary) hypertension: Secondary | ICD-10-CM | POA: Diagnosis not present

## 2021-03-29 DIAGNOSIS — F0280 Dementia in other diseases classified elsewhere without behavioral disturbance: Secondary | ICD-10-CM | POA: Diagnosis not present

## 2021-03-29 DIAGNOSIS — I69354 Hemiplegia and hemiparesis following cerebral infarction affecting left non-dominant side: Secondary | ICD-10-CM | POA: Diagnosis not present

## 2021-03-29 DIAGNOSIS — G309 Alzheimer's disease, unspecified: Secondary | ICD-10-CM | POA: Diagnosis not present

## 2021-03-29 DIAGNOSIS — I4891 Unspecified atrial fibrillation: Secondary | ICD-10-CM | POA: Diagnosis not present

## 2021-03-29 DIAGNOSIS — R32 Unspecified urinary incontinence: Secondary | ICD-10-CM | POA: Diagnosis not present

## 2021-04-01 DIAGNOSIS — Z87440 Personal history of urinary (tract) infections: Secondary | ICD-10-CM | POA: Diagnosis not present

## 2021-04-01 DIAGNOSIS — F331 Major depressive disorder, recurrent, moderate: Secondary | ICD-10-CM | POA: Diagnosis not present

## 2021-04-01 DIAGNOSIS — J309 Allergic rhinitis, unspecified: Secondary | ICD-10-CM | POA: Diagnosis not present

## 2021-04-01 DIAGNOSIS — M810 Age-related osteoporosis without current pathological fracture: Secondary | ICD-10-CM | POA: Diagnosis not present

## 2021-04-01 DIAGNOSIS — G309 Alzheimer's disease, unspecified: Secondary | ICD-10-CM | POA: Diagnosis not present

## 2021-04-01 DIAGNOSIS — F0280 Dementia in other diseases classified elsewhere without behavioral disturbance: Secondary | ICD-10-CM | POA: Diagnosis not present

## 2021-04-01 DIAGNOSIS — H6980 Other specified disorders of Eustachian tube, unspecified ear: Secondary | ICD-10-CM | POA: Diagnosis not present

## 2021-04-01 DIAGNOSIS — R32 Unspecified urinary incontinence: Secondary | ICD-10-CM | POA: Diagnosis not present

## 2021-04-01 DIAGNOSIS — M1990 Unspecified osteoarthritis, unspecified site: Secondary | ICD-10-CM | POA: Diagnosis not present

## 2021-04-01 DIAGNOSIS — D649 Anemia, unspecified: Secondary | ICD-10-CM | POA: Diagnosis not present

## 2021-04-01 DIAGNOSIS — D692 Other nonthrombocytopenic purpura: Secondary | ICD-10-CM | POA: Diagnosis not present

## 2021-04-01 DIAGNOSIS — I69354 Hemiplegia and hemiparesis following cerebral infarction affecting left non-dominant side: Secondary | ICD-10-CM | POA: Diagnosis not present

## 2021-04-01 DIAGNOSIS — I4891 Unspecified atrial fibrillation: Secondary | ICD-10-CM | POA: Diagnosis not present

## 2021-04-01 DIAGNOSIS — Z8673 Personal history of transient ischemic attack (TIA), and cerebral infarction without residual deficits: Secondary | ICD-10-CM | POA: Diagnosis not present

## 2021-04-01 DIAGNOSIS — I10 Essential (primary) hypertension: Secondary | ICD-10-CM | POA: Diagnosis not present

## 2021-04-01 DIAGNOSIS — E2839 Other primary ovarian failure: Secondary | ICD-10-CM | POA: Diagnosis not present

## 2021-04-01 DIAGNOSIS — I739 Peripheral vascular disease, unspecified: Secondary | ICD-10-CM | POA: Diagnosis not present

## 2021-04-01 DIAGNOSIS — I8390 Asymptomatic varicose veins of unspecified lower extremity: Secondary | ICD-10-CM | POA: Diagnosis not present

## 2021-04-01 DIAGNOSIS — E039 Hypothyroidism, unspecified: Secondary | ICD-10-CM | POA: Diagnosis not present

## 2021-04-01 DIAGNOSIS — Z952 Presence of prosthetic heart valve: Secondary | ICD-10-CM | POA: Diagnosis not present

## 2021-04-01 DIAGNOSIS — E7800 Pure hypercholesterolemia, unspecified: Secondary | ICD-10-CM | POA: Diagnosis not present

## 2021-04-01 DIAGNOSIS — K219 Gastro-esophageal reflux disease without esophagitis: Secondary | ICD-10-CM | POA: Diagnosis not present

## 2021-04-01 DIAGNOSIS — R350 Frequency of micturition: Secondary | ICD-10-CM | POA: Diagnosis not present

## 2021-04-01 DIAGNOSIS — I700 Atherosclerosis of aorta: Secondary | ICD-10-CM | POA: Diagnosis not present

## 2021-04-02 DIAGNOSIS — I4891 Unspecified atrial fibrillation: Secondary | ICD-10-CM | POA: Diagnosis not present

## 2021-04-02 DIAGNOSIS — I69354 Hemiplegia and hemiparesis following cerebral infarction affecting left non-dominant side: Secondary | ICD-10-CM | POA: Diagnosis not present

## 2021-04-02 DIAGNOSIS — F0280 Dementia in other diseases classified elsewhere without behavioral disturbance: Secondary | ICD-10-CM | POA: Diagnosis not present

## 2021-04-02 DIAGNOSIS — R32 Unspecified urinary incontinence: Secondary | ICD-10-CM | POA: Diagnosis not present

## 2021-04-02 DIAGNOSIS — I10 Essential (primary) hypertension: Secondary | ICD-10-CM | POA: Diagnosis not present

## 2021-04-02 DIAGNOSIS — G309 Alzheimer's disease, unspecified: Secondary | ICD-10-CM | POA: Diagnosis not present

## 2021-04-03 DIAGNOSIS — L219 Seborrheic dermatitis, unspecified: Secondary | ICD-10-CM | POA: Diagnosis not present

## 2021-04-03 DIAGNOSIS — J309 Allergic rhinitis, unspecified: Secondary | ICD-10-CM | POA: Diagnosis not present

## 2021-04-03 DIAGNOSIS — L97509 Non-pressure chronic ulcer of other part of unspecified foot with unspecified severity: Secondary | ICD-10-CM | POA: Diagnosis not present

## 2021-04-03 DIAGNOSIS — Z299 Encounter for prophylactic measures, unspecified: Secondary | ICD-10-CM | POA: Diagnosis not present

## 2021-04-03 DIAGNOSIS — I10 Essential (primary) hypertension: Secondary | ICD-10-CM | POA: Diagnosis not present

## 2021-04-05 DIAGNOSIS — I4891 Unspecified atrial fibrillation: Secondary | ICD-10-CM | POA: Diagnosis not present

## 2021-04-05 DIAGNOSIS — G309 Alzheimer's disease, unspecified: Secondary | ICD-10-CM | POA: Diagnosis not present

## 2021-04-05 DIAGNOSIS — I10 Essential (primary) hypertension: Secondary | ICD-10-CM | POA: Diagnosis not present

## 2021-04-05 DIAGNOSIS — I69354 Hemiplegia and hemiparesis following cerebral infarction affecting left non-dominant side: Secondary | ICD-10-CM | POA: Diagnosis not present

## 2021-04-05 DIAGNOSIS — R32 Unspecified urinary incontinence: Secondary | ICD-10-CM | POA: Diagnosis not present

## 2021-04-05 DIAGNOSIS — F0280 Dementia in other diseases classified elsewhere without behavioral disturbance: Secondary | ICD-10-CM | POA: Diagnosis not present

## 2021-04-08 DIAGNOSIS — I10 Essential (primary) hypertension: Secondary | ICD-10-CM | POA: Diagnosis not present

## 2021-04-08 DIAGNOSIS — F0280 Dementia in other diseases classified elsewhere without behavioral disturbance: Secondary | ICD-10-CM | POA: Diagnosis not present

## 2021-04-08 DIAGNOSIS — I4891 Unspecified atrial fibrillation: Secondary | ICD-10-CM | POA: Diagnosis not present

## 2021-04-08 DIAGNOSIS — I69354 Hemiplegia and hemiparesis following cerebral infarction affecting left non-dominant side: Secondary | ICD-10-CM | POA: Diagnosis not present

## 2021-04-08 DIAGNOSIS — G309 Alzheimer's disease, unspecified: Secondary | ICD-10-CM | POA: Diagnosis not present

## 2021-04-08 DIAGNOSIS — R32 Unspecified urinary incontinence: Secondary | ICD-10-CM | POA: Diagnosis not present

## 2021-04-09 DIAGNOSIS — R32 Unspecified urinary incontinence: Secondary | ICD-10-CM | POA: Diagnosis not present

## 2021-04-09 DIAGNOSIS — I69354 Hemiplegia and hemiparesis following cerebral infarction affecting left non-dominant side: Secondary | ICD-10-CM | POA: Diagnosis not present

## 2021-04-09 DIAGNOSIS — I4891 Unspecified atrial fibrillation: Secondary | ICD-10-CM | POA: Diagnosis not present

## 2021-04-09 DIAGNOSIS — F0280 Dementia in other diseases classified elsewhere without behavioral disturbance: Secondary | ICD-10-CM | POA: Diagnosis not present

## 2021-04-09 DIAGNOSIS — G309 Alzheimer's disease, unspecified: Secondary | ICD-10-CM | POA: Diagnosis not present

## 2021-04-09 DIAGNOSIS — I10 Essential (primary) hypertension: Secondary | ICD-10-CM | POA: Diagnosis not present

## 2021-04-11 DIAGNOSIS — I69354 Hemiplegia and hemiparesis following cerebral infarction affecting left non-dominant side: Secondary | ICD-10-CM | POA: Diagnosis not present

## 2021-04-11 DIAGNOSIS — F0280 Dementia in other diseases classified elsewhere without behavioral disturbance: Secondary | ICD-10-CM | POA: Diagnosis not present

## 2021-04-11 DIAGNOSIS — I4891 Unspecified atrial fibrillation: Secondary | ICD-10-CM | POA: Diagnosis not present

## 2021-04-11 DIAGNOSIS — G309 Alzheimer's disease, unspecified: Secondary | ICD-10-CM | POA: Diagnosis not present

## 2021-04-11 DIAGNOSIS — I10 Essential (primary) hypertension: Secondary | ICD-10-CM | POA: Diagnosis not present

## 2021-04-11 DIAGNOSIS — R32 Unspecified urinary incontinence: Secondary | ICD-10-CM | POA: Diagnosis not present

## 2021-04-15 DIAGNOSIS — G309 Alzheimer's disease, unspecified: Secondary | ICD-10-CM | POA: Diagnosis not present

## 2021-04-15 DIAGNOSIS — I10 Essential (primary) hypertension: Secondary | ICD-10-CM | POA: Diagnosis not present

## 2021-04-15 DIAGNOSIS — F0280 Dementia in other diseases classified elsewhere without behavioral disturbance: Secondary | ICD-10-CM | POA: Diagnosis not present

## 2021-04-15 DIAGNOSIS — R32 Unspecified urinary incontinence: Secondary | ICD-10-CM | POA: Diagnosis not present

## 2021-04-15 DIAGNOSIS — I4891 Unspecified atrial fibrillation: Secondary | ICD-10-CM | POA: Diagnosis not present

## 2021-04-15 DIAGNOSIS — I69354 Hemiplegia and hemiparesis following cerebral infarction affecting left non-dominant side: Secondary | ICD-10-CM | POA: Diagnosis not present

## 2021-04-16 DIAGNOSIS — F0280 Dementia in other diseases classified elsewhere without behavioral disturbance: Secondary | ICD-10-CM | POA: Diagnosis not present

## 2021-04-16 DIAGNOSIS — I69354 Hemiplegia and hemiparesis following cerebral infarction affecting left non-dominant side: Secondary | ICD-10-CM | POA: Diagnosis not present

## 2021-04-16 DIAGNOSIS — I4891 Unspecified atrial fibrillation: Secondary | ICD-10-CM | POA: Diagnosis not present

## 2021-04-16 DIAGNOSIS — I10 Essential (primary) hypertension: Secondary | ICD-10-CM | POA: Diagnosis not present

## 2021-04-16 DIAGNOSIS — R32 Unspecified urinary incontinence: Secondary | ICD-10-CM | POA: Diagnosis not present

## 2021-04-16 DIAGNOSIS — G309 Alzheimer's disease, unspecified: Secondary | ICD-10-CM | POA: Diagnosis not present

## 2021-04-17 DIAGNOSIS — F0280 Dementia in other diseases classified elsewhere without behavioral disturbance: Secondary | ICD-10-CM | POA: Diagnosis not present

## 2021-04-17 DIAGNOSIS — R32 Unspecified urinary incontinence: Secondary | ICD-10-CM | POA: Diagnosis not present

## 2021-04-17 DIAGNOSIS — I69354 Hemiplegia and hemiparesis following cerebral infarction affecting left non-dominant side: Secondary | ICD-10-CM | POA: Diagnosis not present

## 2021-04-17 DIAGNOSIS — I4891 Unspecified atrial fibrillation: Secondary | ICD-10-CM | POA: Diagnosis not present

## 2021-04-17 DIAGNOSIS — I10 Essential (primary) hypertension: Secondary | ICD-10-CM | POA: Diagnosis not present

## 2021-04-17 DIAGNOSIS — G309 Alzheimer's disease, unspecified: Secondary | ICD-10-CM | POA: Diagnosis not present

## 2021-04-18 DIAGNOSIS — F0280 Dementia in other diseases classified elsewhere without behavioral disturbance: Secondary | ICD-10-CM | POA: Diagnosis not present

## 2021-04-18 DIAGNOSIS — I69354 Hemiplegia and hemiparesis following cerebral infarction affecting left non-dominant side: Secondary | ICD-10-CM | POA: Diagnosis not present

## 2021-04-18 DIAGNOSIS — R32 Unspecified urinary incontinence: Secondary | ICD-10-CM | POA: Diagnosis not present

## 2021-04-18 DIAGNOSIS — G309 Alzheimer's disease, unspecified: Secondary | ICD-10-CM | POA: Diagnosis not present

## 2021-04-18 DIAGNOSIS — I10 Essential (primary) hypertension: Secondary | ICD-10-CM | POA: Diagnosis not present

## 2021-04-18 DIAGNOSIS — I4891 Unspecified atrial fibrillation: Secondary | ICD-10-CM | POA: Diagnosis not present

## 2021-04-22 DIAGNOSIS — I10 Essential (primary) hypertension: Secondary | ICD-10-CM | POA: Diagnosis not present

## 2021-04-22 DIAGNOSIS — I69354 Hemiplegia and hemiparesis following cerebral infarction affecting left non-dominant side: Secondary | ICD-10-CM | POA: Diagnosis not present

## 2021-04-22 DIAGNOSIS — G309 Alzheimer's disease, unspecified: Secondary | ICD-10-CM | POA: Diagnosis not present

## 2021-04-22 DIAGNOSIS — F0280 Dementia in other diseases classified elsewhere without behavioral disturbance: Secondary | ICD-10-CM | POA: Diagnosis not present

## 2021-04-22 DIAGNOSIS — R32 Unspecified urinary incontinence: Secondary | ICD-10-CM | POA: Diagnosis not present

## 2021-04-22 DIAGNOSIS — I4891 Unspecified atrial fibrillation: Secondary | ICD-10-CM | POA: Diagnosis not present

## 2021-04-24 DIAGNOSIS — I4891 Unspecified atrial fibrillation: Secondary | ICD-10-CM | POA: Diagnosis not present

## 2021-04-24 DIAGNOSIS — I69354 Hemiplegia and hemiparesis following cerebral infarction affecting left non-dominant side: Secondary | ICD-10-CM | POA: Diagnosis not present

## 2021-04-24 DIAGNOSIS — F0280 Dementia in other diseases classified elsewhere without behavioral disturbance: Secondary | ICD-10-CM | POA: Diagnosis not present

## 2021-04-24 DIAGNOSIS — R32 Unspecified urinary incontinence: Secondary | ICD-10-CM | POA: Diagnosis not present

## 2021-04-24 DIAGNOSIS — I10 Essential (primary) hypertension: Secondary | ICD-10-CM | POA: Diagnosis not present

## 2021-04-24 DIAGNOSIS — G309 Alzheimer's disease, unspecified: Secondary | ICD-10-CM | POA: Diagnosis not present

## 2021-04-25 DIAGNOSIS — I10 Essential (primary) hypertension: Secondary | ICD-10-CM | POA: Diagnosis not present

## 2021-04-25 DIAGNOSIS — R32 Unspecified urinary incontinence: Secondary | ICD-10-CM | POA: Diagnosis not present

## 2021-04-25 DIAGNOSIS — I69354 Hemiplegia and hemiparesis following cerebral infarction affecting left non-dominant side: Secondary | ICD-10-CM | POA: Diagnosis not present

## 2021-04-25 DIAGNOSIS — I4891 Unspecified atrial fibrillation: Secondary | ICD-10-CM | POA: Diagnosis not present

## 2021-04-25 DIAGNOSIS — G309 Alzheimer's disease, unspecified: Secondary | ICD-10-CM | POA: Diagnosis not present

## 2021-04-25 DIAGNOSIS — F0280 Dementia in other diseases classified elsewhere without behavioral disturbance: Secondary | ICD-10-CM | POA: Diagnosis not present

## 2021-04-29 DIAGNOSIS — I69354 Hemiplegia and hemiparesis following cerebral infarction affecting left non-dominant side: Secondary | ICD-10-CM | POA: Diagnosis not present

## 2021-04-29 DIAGNOSIS — I4891 Unspecified atrial fibrillation: Secondary | ICD-10-CM | POA: Diagnosis not present

## 2021-04-29 DIAGNOSIS — R32 Unspecified urinary incontinence: Secondary | ICD-10-CM | POA: Diagnosis not present

## 2021-04-29 DIAGNOSIS — G309 Alzheimer's disease, unspecified: Secondary | ICD-10-CM | POA: Diagnosis not present

## 2021-04-29 DIAGNOSIS — F0280 Dementia in other diseases classified elsewhere without behavioral disturbance: Secondary | ICD-10-CM | POA: Diagnosis not present

## 2021-04-29 DIAGNOSIS — I10 Essential (primary) hypertension: Secondary | ICD-10-CM | POA: Diagnosis not present

## 2021-05-01 DIAGNOSIS — L89892 Pressure ulcer of other site, stage 2: Secondary | ICD-10-CM | POA: Diagnosis not present

## 2021-05-01 DIAGNOSIS — Z8673 Personal history of transient ischemic attack (TIA), and cerebral infarction without residual deficits: Secondary | ICD-10-CM | POA: Diagnosis not present

## 2021-05-01 DIAGNOSIS — H6980 Other specified disorders of Eustachian tube, unspecified ear: Secondary | ICD-10-CM | POA: Diagnosis not present

## 2021-05-01 DIAGNOSIS — I69354 Hemiplegia and hemiparesis following cerebral infarction affecting left non-dominant side: Secondary | ICD-10-CM | POA: Diagnosis not present

## 2021-05-01 DIAGNOSIS — J309 Allergic rhinitis, unspecified: Secondary | ICD-10-CM | POA: Diagnosis not present

## 2021-05-01 DIAGNOSIS — E7800 Pure hypercholesterolemia, unspecified: Secondary | ICD-10-CM | POA: Diagnosis not present

## 2021-05-01 DIAGNOSIS — I739 Peripheral vascular disease, unspecified: Secondary | ICD-10-CM | POA: Diagnosis not present

## 2021-05-01 DIAGNOSIS — R350 Frequency of micturition: Secondary | ICD-10-CM | POA: Diagnosis not present

## 2021-05-01 DIAGNOSIS — R32 Unspecified urinary incontinence: Secondary | ICD-10-CM | POA: Diagnosis not present

## 2021-05-01 DIAGNOSIS — F331 Major depressive disorder, recurrent, moderate: Secondary | ICD-10-CM | POA: Diagnosis not present

## 2021-05-01 DIAGNOSIS — I700 Atherosclerosis of aorta: Secondary | ICD-10-CM | POA: Diagnosis not present

## 2021-05-01 DIAGNOSIS — E2839 Other primary ovarian failure: Secondary | ICD-10-CM | POA: Diagnosis not present

## 2021-05-01 DIAGNOSIS — M810 Age-related osteoporosis without current pathological fracture: Secondary | ICD-10-CM | POA: Diagnosis not present

## 2021-05-01 DIAGNOSIS — Z87440 Personal history of urinary (tract) infections: Secondary | ICD-10-CM | POA: Diagnosis not present

## 2021-05-01 DIAGNOSIS — F0280 Dementia in other diseases classified elsewhere without behavioral disturbance: Secondary | ICD-10-CM | POA: Diagnosis not present

## 2021-05-01 DIAGNOSIS — E039 Hypothyroidism, unspecified: Secondary | ICD-10-CM | POA: Diagnosis not present

## 2021-05-01 DIAGNOSIS — I10 Essential (primary) hypertension: Secondary | ICD-10-CM | POA: Diagnosis not present

## 2021-05-01 DIAGNOSIS — D692 Other nonthrombocytopenic purpura: Secondary | ICD-10-CM | POA: Diagnosis not present

## 2021-05-01 DIAGNOSIS — I8390 Asymptomatic varicose veins of unspecified lower extremity: Secondary | ICD-10-CM | POA: Diagnosis not present

## 2021-05-01 DIAGNOSIS — Z952 Presence of prosthetic heart valve: Secondary | ICD-10-CM | POA: Diagnosis not present

## 2021-05-01 DIAGNOSIS — G309 Alzheimer's disease, unspecified: Secondary | ICD-10-CM | POA: Diagnosis not present

## 2021-05-01 DIAGNOSIS — K219 Gastro-esophageal reflux disease without esophagitis: Secondary | ICD-10-CM | POA: Diagnosis not present

## 2021-05-01 DIAGNOSIS — D649 Anemia, unspecified: Secondary | ICD-10-CM | POA: Diagnosis not present

## 2021-05-01 DIAGNOSIS — I4891 Unspecified atrial fibrillation: Secondary | ICD-10-CM | POA: Diagnosis not present

## 2021-05-01 DIAGNOSIS — M1990 Unspecified osteoarthritis, unspecified site: Secondary | ICD-10-CM | POA: Diagnosis not present

## 2021-05-03 DIAGNOSIS — F0280 Dementia in other diseases classified elsewhere without behavioral disturbance: Secondary | ICD-10-CM | POA: Diagnosis not present

## 2021-05-03 DIAGNOSIS — R32 Unspecified urinary incontinence: Secondary | ICD-10-CM | POA: Diagnosis not present

## 2021-05-03 DIAGNOSIS — I4891 Unspecified atrial fibrillation: Secondary | ICD-10-CM | POA: Diagnosis not present

## 2021-05-03 DIAGNOSIS — I69354 Hemiplegia and hemiparesis following cerebral infarction affecting left non-dominant side: Secondary | ICD-10-CM | POA: Diagnosis not present

## 2021-05-03 DIAGNOSIS — L89892 Pressure ulcer of other site, stage 2: Secondary | ICD-10-CM | POA: Diagnosis not present

## 2021-05-03 DIAGNOSIS — G309 Alzheimer's disease, unspecified: Secondary | ICD-10-CM | POA: Diagnosis not present

## 2021-05-06 DIAGNOSIS — I69354 Hemiplegia and hemiparesis following cerebral infarction affecting left non-dominant side: Secondary | ICD-10-CM | POA: Diagnosis not present

## 2021-05-06 DIAGNOSIS — R32 Unspecified urinary incontinence: Secondary | ICD-10-CM | POA: Diagnosis not present

## 2021-05-06 DIAGNOSIS — I4891 Unspecified atrial fibrillation: Secondary | ICD-10-CM | POA: Diagnosis not present

## 2021-05-06 DIAGNOSIS — G309 Alzheimer's disease, unspecified: Secondary | ICD-10-CM | POA: Diagnosis not present

## 2021-05-06 DIAGNOSIS — L89892 Pressure ulcer of other site, stage 2: Secondary | ICD-10-CM | POA: Diagnosis not present

## 2021-05-06 DIAGNOSIS — F0280 Dementia in other diseases classified elsewhere without behavioral disturbance: Secondary | ICD-10-CM | POA: Diagnosis not present

## 2021-05-08 DIAGNOSIS — L89892 Pressure ulcer of other site, stage 2: Secondary | ICD-10-CM | POA: Diagnosis not present

## 2021-05-08 DIAGNOSIS — R110 Nausea: Secondary | ICD-10-CM | POA: Diagnosis not present

## 2021-05-08 DIAGNOSIS — I4891 Unspecified atrial fibrillation: Secondary | ICD-10-CM | POA: Diagnosis not present

## 2021-05-08 DIAGNOSIS — Z299 Encounter for prophylactic measures, unspecified: Secondary | ICD-10-CM | POA: Diagnosis not present

## 2021-05-08 DIAGNOSIS — R32 Unspecified urinary incontinence: Secondary | ICD-10-CM | POA: Diagnosis not present

## 2021-05-08 DIAGNOSIS — I69354 Hemiplegia and hemiparesis following cerebral infarction affecting left non-dominant side: Secondary | ICD-10-CM | POA: Diagnosis not present

## 2021-05-08 DIAGNOSIS — L97521 Non-pressure chronic ulcer of other part of left foot limited to breakdown of skin: Secondary | ICD-10-CM | POA: Diagnosis not present

## 2021-05-08 DIAGNOSIS — F0280 Dementia in other diseases classified elsewhere without behavioral disturbance: Secondary | ICD-10-CM | POA: Diagnosis not present

## 2021-05-08 DIAGNOSIS — G309 Alzheimer's disease, unspecified: Secondary | ICD-10-CM | POA: Diagnosis not present

## 2021-05-08 DIAGNOSIS — Z789 Other specified health status: Secondary | ICD-10-CM | POA: Diagnosis not present

## 2021-05-08 DIAGNOSIS — I10 Essential (primary) hypertension: Secondary | ICD-10-CM | POA: Diagnosis not present

## 2021-05-10 DIAGNOSIS — G309 Alzheimer's disease, unspecified: Secondary | ICD-10-CM | POA: Diagnosis not present

## 2021-05-10 DIAGNOSIS — I4891 Unspecified atrial fibrillation: Secondary | ICD-10-CM | POA: Diagnosis not present

## 2021-05-10 DIAGNOSIS — I69354 Hemiplegia and hemiparesis following cerebral infarction affecting left non-dominant side: Secondary | ICD-10-CM | POA: Diagnosis not present

## 2021-05-10 DIAGNOSIS — L89892 Pressure ulcer of other site, stage 2: Secondary | ICD-10-CM | POA: Diagnosis not present

## 2021-05-10 DIAGNOSIS — R32 Unspecified urinary incontinence: Secondary | ICD-10-CM | POA: Diagnosis not present

## 2021-05-10 DIAGNOSIS — F0280 Dementia in other diseases classified elsewhere without behavioral disturbance: Secondary | ICD-10-CM | POA: Diagnosis not present

## 2021-05-13 DIAGNOSIS — K922 Gastrointestinal hemorrhage, unspecified: Secondary | ICD-10-CM | POA: Diagnosis not present

## 2021-05-13 DIAGNOSIS — G459 Transient cerebral ischemic attack, unspecified: Secondary | ICD-10-CM | POA: Diagnosis not present

## 2021-05-13 DIAGNOSIS — I639 Cerebral infarction, unspecified: Secondary | ICD-10-CM | POA: Diagnosis not present

## 2021-05-13 DIAGNOSIS — I4891 Unspecified atrial fibrillation: Secondary | ICD-10-CM | POA: Diagnosis not present

## 2021-05-14 DIAGNOSIS — F0280 Dementia in other diseases classified elsewhere without behavioral disturbance: Secondary | ICD-10-CM | POA: Diagnosis not present

## 2021-05-14 DIAGNOSIS — I4891 Unspecified atrial fibrillation: Secondary | ICD-10-CM | POA: Diagnosis not present

## 2021-05-14 DIAGNOSIS — G309 Alzheimer's disease, unspecified: Secondary | ICD-10-CM | POA: Diagnosis not present

## 2021-05-14 DIAGNOSIS — R32 Unspecified urinary incontinence: Secondary | ICD-10-CM | POA: Diagnosis not present

## 2021-05-14 DIAGNOSIS — L89892 Pressure ulcer of other site, stage 2: Secondary | ICD-10-CM | POA: Diagnosis not present

## 2021-05-14 DIAGNOSIS — I69354 Hemiplegia and hemiparesis following cerebral infarction affecting left non-dominant side: Secondary | ICD-10-CM | POA: Diagnosis not present

## 2021-05-15 DIAGNOSIS — L84 Corns and callosities: Secondary | ICD-10-CM | POA: Diagnosis not present

## 2021-05-15 DIAGNOSIS — M79671 Pain in right foot: Secondary | ICD-10-CM | POA: Diagnosis not present

## 2021-05-15 DIAGNOSIS — Z79890 Hormone replacement therapy (postmenopausal): Secondary | ICD-10-CM | POA: Diagnosis not present

## 2021-05-15 DIAGNOSIS — Z66 Do not resuscitate: Secondary | ICD-10-CM | POA: Diagnosis not present

## 2021-05-15 DIAGNOSIS — Z79899 Other long term (current) drug therapy: Secondary | ICD-10-CM | POA: Diagnosis not present

## 2021-05-15 DIAGNOSIS — L97521 Non-pressure chronic ulcer of other part of left foot limited to breakdown of skin: Secondary | ICD-10-CM | POA: Diagnosis not present

## 2021-05-17 DIAGNOSIS — F0280 Dementia in other diseases classified elsewhere without behavioral disturbance: Secondary | ICD-10-CM | POA: Diagnosis not present

## 2021-05-17 DIAGNOSIS — R32 Unspecified urinary incontinence: Secondary | ICD-10-CM | POA: Diagnosis not present

## 2021-05-17 DIAGNOSIS — I4891 Unspecified atrial fibrillation: Secondary | ICD-10-CM | POA: Diagnosis not present

## 2021-05-17 DIAGNOSIS — G309 Alzheimer's disease, unspecified: Secondary | ICD-10-CM | POA: Diagnosis not present

## 2021-05-17 DIAGNOSIS — L89892 Pressure ulcer of other site, stage 2: Secondary | ICD-10-CM | POA: Diagnosis not present

## 2021-05-17 DIAGNOSIS — I69354 Hemiplegia and hemiparesis following cerebral infarction affecting left non-dominant side: Secondary | ICD-10-CM | POA: Diagnosis not present

## 2021-05-18 DIAGNOSIS — R32 Unspecified urinary incontinence: Secondary | ICD-10-CM | POA: Diagnosis not present

## 2021-05-18 DIAGNOSIS — I4891 Unspecified atrial fibrillation: Secondary | ICD-10-CM | POA: Diagnosis not present

## 2021-05-18 DIAGNOSIS — L89892 Pressure ulcer of other site, stage 2: Secondary | ICD-10-CM | POA: Diagnosis not present

## 2021-05-18 DIAGNOSIS — G309 Alzheimer's disease, unspecified: Secondary | ICD-10-CM | POA: Diagnosis not present

## 2021-05-18 DIAGNOSIS — I69354 Hemiplegia and hemiparesis following cerebral infarction affecting left non-dominant side: Secondary | ICD-10-CM | POA: Diagnosis not present

## 2021-05-18 DIAGNOSIS — F0280 Dementia in other diseases classified elsewhere without behavioral disturbance: Secondary | ICD-10-CM | POA: Diagnosis not present

## 2021-05-21 DIAGNOSIS — Z299 Encounter for prophylactic measures, unspecified: Secondary | ICD-10-CM | POA: Diagnosis not present

## 2021-05-21 DIAGNOSIS — R32 Unspecified urinary incontinence: Secondary | ICD-10-CM | POA: Diagnosis not present

## 2021-05-21 DIAGNOSIS — Z681 Body mass index (BMI) 19 or less, adult: Secondary | ICD-10-CM | POA: Diagnosis not present

## 2021-05-21 DIAGNOSIS — L89892 Pressure ulcer of other site, stage 2: Secondary | ICD-10-CM | POA: Diagnosis not present

## 2021-05-21 DIAGNOSIS — K529 Noninfective gastroenteritis and colitis, unspecified: Secondary | ICD-10-CM | POA: Diagnosis not present

## 2021-05-21 DIAGNOSIS — F0280 Dementia in other diseases classified elsewhere without behavioral disturbance: Secondary | ICD-10-CM | POA: Diagnosis not present

## 2021-05-21 DIAGNOSIS — I69354 Hemiplegia and hemiparesis following cerebral infarction affecting left non-dominant side: Secondary | ICD-10-CM | POA: Diagnosis not present

## 2021-05-21 DIAGNOSIS — K5792 Diverticulitis of intestine, part unspecified, without perforation or abscess without bleeding: Secondary | ICD-10-CM | POA: Diagnosis not present

## 2021-05-21 DIAGNOSIS — I10 Essential (primary) hypertension: Secondary | ICD-10-CM | POA: Diagnosis not present

## 2021-05-21 DIAGNOSIS — I4891 Unspecified atrial fibrillation: Secondary | ICD-10-CM | POA: Diagnosis not present

## 2021-05-21 DIAGNOSIS — G309 Alzheimer's disease, unspecified: Secondary | ICD-10-CM | POA: Diagnosis not present

## 2021-05-24 DIAGNOSIS — L89892 Pressure ulcer of other site, stage 2: Secondary | ICD-10-CM | POA: Diagnosis not present

## 2021-05-24 DIAGNOSIS — I4891 Unspecified atrial fibrillation: Secondary | ICD-10-CM | POA: Diagnosis not present

## 2021-05-24 DIAGNOSIS — I69354 Hemiplegia and hemiparesis following cerebral infarction affecting left non-dominant side: Secondary | ICD-10-CM | POA: Diagnosis not present

## 2021-05-24 DIAGNOSIS — R32 Unspecified urinary incontinence: Secondary | ICD-10-CM | POA: Diagnosis not present

## 2021-05-24 DIAGNOSIS — F0280 Dementia in other diseases classified elsewhere without behavioral disturbance: Secondary | ICD-10-CM | POA: Diagnosis not present

## 2021-05-24 DIAGNOSIS — G309 Alzheimer's disease, unspecified: Secondary | ICD-10-CM | POA: Diagnosis not present

## 2021-05-28 DIAGNOSIS — R32 Unspecified urinary incontinence: Secondary | ICD-10-CM | POA: Diagnosis not present

## 2021-05-28 DIAGNOSIS — I4891 Unspecified atrial fibrillation: Secondary | ICD-10-CM | POA: Diagnosis not present

## 2021-05-28 DIAGNOSIS — F0280 Dementia in other diseases classified elsewhere without behavioral disturbance: Secondary | ICD-10-CM | POA: Diagnosis not present

## 2021-05-28 DIAGNOSIS — L89892 Pressure ulcer of other site, stage 2: Secondary | ICD-10-CM | POA: Diagnosis not present

## 2021-05-28 DIAGNOSIS — I69354 Hemiplegia and hemiparesis following cerebral infarction affecting left non-dominant side: Secondary | ICD-10-CM | POA: Diagnosis not present

## 2021-05-28 DIAGNOSIS — G309 Alzheimer's disease, unspecified: Secondary | ICD-10-CM | POA: Diagnosis not present

## 2021-05-31 DIAGNOSIS — E039 Hypothyroidism, unspecified: Secondary | ICD-10-CM | POA: Diagnosis not present

## 2021-05-31 DIAGNOSIS — I739 Peripheral vascular disease, unspecified: Secondary | ICD-10-CM | POA: Diagnosis not present

## 2021-05-31 DIAGNOSIS — D692 Other nonthrombocytopenic purpura: Secondary | ICD-10-CM | POA: Diagnosis not present

## 2021-05-31 DIAGNOSIS — K219 Gastro-esophageal reflux disease without esophagitis: Secondary | ICD-10-CM | POA: Diagnosis not present

## 2021-05-31 DIAGNOSIS — I4891 Unspecified atrial fibrillation: Secondary | ICD-10-CM | POA: Diagnosis not present

## 2021-05-31 DIAGNOSIS — E7800 Pure hypercholesterolemia, unspecified: Secondary | ICD-10-CM | POA: Diagnosis not present

## 2021-05-31 DIAGNOSIS — I8390 Asymptomatic varicose veins of unspecified lower extremity: Secondary | ICD-10-CM | POA: Diagnosis not present

## 2021-05-31 DIAGNOSIS — H6980 Other specified disorders of Eustachian tube, unspecified ear: Secondary | ICD-10-CM | POA: Diagnosis not present

## 2021-05-31 DIAGNOSIS — F0280 Dementia in other diseases classified elsewhere without behavioral disturbance: Secondary | ICD-10-CM | POA: Diagnosis not present

## 2021-05-31 DIAGNOSIS — M810 Age-related osteoporosis without current pathological fracture: Secondary | ICD-10-CM | POA: Diagnosis not present

## 2021-05-31 DIAGNOSIS — D649 Anemia, unspecified: Secondary | ICD-10-CM | POA: Diagnosis not present

## 2021-05-31 DIAGNOSIS — G309 Alzheimer's disease, unspecified: Secondary | ICD-10-CM | POA: Diagnosis not present

## 2021-05-31 DIAGNOSIS — Z8673 Personal history of transient ischemic attack (TIA), and cerebral infarction without residual deficits: Secondary | ICD-10-CM | POA: Diagnosis not present

## 2021-05-31 DIAGNOSIS — Z87440 Personal history of urinary (tract) infections: Secondary | ICD-10-CM | POA: Diagnosis not present

## 2021-05-31 DIAGNOSIS — Z952 Presence of prosthetic heart valve: Secondary | ICD-10-CM | POA: Diagnosis not present

## 2021-05-31 DIAGNOSIS — F331 Major depressive disorder, recurrent, moderate: Secondary | ICD-10-CM | POA: Diagnosis not present

## 2021-05-31 DIAGNOSIS — J309 Allergic rhinitis, unspecified: Secondary | ICD-10-CM | POA: Diagnosis not present

## 2021-05-31 DIAGNOSIS — E2839 Other primary ovarian failure: Secondary | ICD-10-CM | POA: Diagnosis not present

## 2021-05-31 DIAGNOSIS — I10 Essential (primary) hypertension: Secondary | ICD-10-CM | POA: Diagnosis not present

## 2021-05-31 DIAGNOSIS — I700 Atherosclerosis of aorta: Secondary | ICD-10-CM | POA: Diagnosis not present

## 2021-05-31 DIAGNOSIS — L89892 Pressure ulcer of other site, stage 2: Secondary | ICD-10-CM | POA: Diagnosis not present

## 2021-05-31 DIAGNOSIS — R32 Unspecified urinary incontinence: Secondary | ICD-10-CM | POA: Diagnosis not present

## 2021-05-31 DIAGNOSIS — I69354 Hemiplegia and hemiparesis following cerebral infarction affecting left non-dominant side: Secondary | ICD-10-CM | POA: Diagnosis not present

## 2021-05-31 DIAGNOSIS — R350 Frequency of micturition: Secondary | ICD-10-CM | POA: Diagnosis not present

## 2021-05-31 DIAGNOSIS — M1990 Unspecified osteoarthritis, unspecified site: Secondary | ICD-10-CM | POA: Diagnosis not present

## 2021-06-05 DIAGNOSIS — L84 Corns and callosities: Secondary | ICD-10-CM | POA: Diagnosis not present

## 2021-06-05 DIAGNOSIS — Z79890 Hormone replacement therapy (postmenopausal): Secondary | ICD-10-CM | POA: Diagnosis not present

## 2021-06-05 DIAGNOSIS — Z79899 Other long term (current) drug therapy: Secondary | ICD-10-CM | POA: Diagnosis not present

## 2021-06-05 DIAGNOSIS — M79671 Pain in right foot: Secondary | ICD-10-CM | POA: Diagnosis not present

## 2021-06-05 DIAGNOSIS — L97521 Non-pressure chronic ulcer of other part of left foot limited to breakdown of skin: Secondary | ICD-10-CM | POA: Diagnosis not present

## 2021-06-05 DIAGNOSIS — Z66 Do not resuscitate: Secondary | ICD-10-CM | POA: Diagnosis not present

## 2021-06-11 DIAGNOSIS — C799 Secondary malignant neoplasm of unspecified site: Secondary | ICD-10-CM | POA: Diagnosis not present

## 2021-06-11 DIAGNOSIS — D649 Anemia, unspecified: Secondary | ICD-10-CM | POA: Diagnosis not present

## 2021-06-11 DIAGNOSIS — I10 Essential (primary) hypertension: Secondary | ICD-10-CM | POA: Diagnosis not present

## 2021-06-11 DIAGNOSIS — Z9981 Dependence on supplemental oxygen: Secondary | ICD-10-CM | POA: Diagnosis not present

## 2021-06-11 DIAGNOSIS — L97521 Non-pressure chronic ulcer of other part of left foot limited to breakdown of skin: Secondary | ICD-10-CM | POA: Diagnosis not present

## 2021-06-11 DIAGNOSIS — K769 Liver disease, unspecified: Secondary | ICD-10-CM | POA: Diagnosis not present

## 2021-06-11 DIAGNOSIS — Z792 Long term (current) use of antibiotics: Secondary | ICD-10-CM | POA: Diagnosis not present

## 2021-06-11 DIAGNOSIS — C182 Malignant neoplasm of ascending colon: Secondary | ICD-10-CM | POA: Diagnosis not present

## 2021-06-11 DIAGNOSIS — R52 Pain, unspecified: Secondary | ICD-10-CM | POA: Diagnosis not present

## 2021-06-11 DIAGNOSIS — L97522 Non-pressure chronic ulcer of other part of left foot with fat layer exposed: Secondary | ICD-10-CM | POA: Diagnosis not present

## 2021-06-11 DIAGNOSIS — K56600 Partial intestinal obstruction, unspecified as to cause: Secondary | ICD-10-CM | POA: Diagnosis not present

## 2021-06-11 DIAGNOSIS — R195 Other fecal abnormalities: Secondary | ICD-10-CM | POA: Diagnosis not present

## 2021-06-11 DIAGNOSIS — I4891 Unspecified atrial fibrillation: Secondary | ICD-10-CM | POA: Diagnosis not present

## 2021-06-11 DIAGNOSIS — C189 Malignant neoplasm of colon, unspecified: Secondary | ICD-10-CM | POA: Diagnosis not present

## 2021-06-11 DIAGNOSIS — K56609 Unspecified intestinal obstruction, unspecified as to partial versus complete obstruction: Secondary | ICD-10-CM | POA: Diagnosis not present

## 2021-06-11 DIAGNOSIS — B9620 Unspecified Escherichia coli [E. coli] as the cause of diseases classified elsewhere: Secondary | ICD-10-CM | POA: Diagnosis not present

## 2021-06-11 DIAGNOSIS — N281 Cyst of kidney, acquired: Secondary | ICD-10-CM | POA: Diagnosis not present

## 2021-06-11 DIAGNOSIS — G311 Senile degeneration of brain, not elsewhere classified: Secondary | ICD-10-CM | POA: Diagnosis not present

## 2021-06-11 DIAGNOSIS — Z66 Do not resuscitate: Secondary | ICD-10-CM | POA: Diagnosis not present

## 2021-06-11 DIAGNOSIS — R590 Localized enlarged lymph nodes: Secondary | ICD-10-CM | POA: Diagnosis not present

## 2021-06-11 DIAGNOSIS — Z20822 Contact with and (suspected) exposure to covid-19: Secondary | ICD-10-CM | POA: Diagnosis not present

## 2021-06-11 DIAGNOSIS — G301 Alzheimer's disease with late onset: Secondary | ICD-10-CM | POA: Diagnosis not present

## 2021-06-11 DIAGNOSIS — I69354 Hemiplegia and hemiparesis following cerebral infarction affecting left non-dominant side: Secondary | ICD-10-CM | POA: Diagnosis not present

## 2021-06-11 DIAGNOSIS — Z880 Allergy status to penicillin: Secondary | ICD-10-CM | POA: Diagnosis not present

## 2021-06-11 DIAGNOSIS — C7989 Secondary malignant neoplasm of other specified sites: Secondary | ICD-10-CM | POA: Diagnosis not present

## 2021-06-11 DIAGNOSIS — Z79899 Other long term (current) drug therapy: Secondary | ICD-10-CM | POA: Diagnosis not present

## 2021-06-11 DIAGNOSIS — L97529 Non-pressure chronic ulcer of other part of left foot with unspecified severity: Secondary | ICD-10-CM | POA: Diagnosis not present

## 2021-06-11 DIAGNOSIS — K432 Incisional hernia without obstruction or gangrene: Secondary | ICD-10-CM | POA: Diagnosis not present

## 2021-06-11 DIAGNOSIS — N3000 Acute cystitis without hematuria: Secondary | ICD-10-CM | POA: Diagnosis not present

## 2021-06-11 DIAGNOSIS — R32 Unspecified urinary incontinence: Secondary | ICD-10-CM | POA: Diagnosis not present

## 2021-06-11 DIAGNOSIS — L89892 Pressure ulcer of other site, stage 2: Secondary | ICD-10-CM | POA: Diagnosis not present

## 2021-06-11 DIAGNOSIS — Z79890 Hormone replacement therapy (postmenopausal): Secondary | ICD-10-CM | POA: Diagnosis not present

## 2021-06-11 DIAGNOSIS — N3001 Acute cystitis with hematuria: Secondary | ICD-10-CM | POA: Diagnosis not present

## 2021-06-11 DIAGNOSIS — Z932 Ileostomy status: Secondary | ICD-10-CM | POA: Diagnosis not present

## 2021-06-11 DIAGNOSIS — F0280 Dementia in other diseases classified elsewhere without behavioral disturbance: Secondary | ICD-10-CM | POA: Diagnosis not present

## 2021-06-11 DIAGNOSIS — J90 Pleural effusion, not elsewhere classified: Secondary | ICD-10-CM | POA: Diagnosis not present

## 2021-06-11 DIAGNOSIS — K7689 Other specified diseases of liver: Secondary | ICD-10-CM | POA: Diagnosis not present

## 2021-06-11 DIAGNOSIS — G309 Alzheimer's disease, unspecified: Secondary | ICD-10-CM | POA: Diagnosis not present

## 2021-06-11 DIAGNOSIS — Z882 Allergy status to sulfonamides status: Secondary | ICD-10-CM | POA: Diagnosis not present

## 2021-06-11 DIAGNOSIS — Z881 Allergy status to other antibiotic agents status: Secondary | ICD-10-CM | POA: Diagnosis not present

## 2021-06-11 DIAGNOSIS — C786 Secondary malignant neoplasm of retroperitoneum and peritoneum: Secondary | ICD-10-CM | POA: Diagnosis not present

## 2021-07-06 DEATH — deceased
